# Patient Record
Sex: Male | Born: 1945 | Race: White | Hispanic: No | Marital: Married | Smoking: Former smoker
Health system: Southern US, Community
[De-identification: ages and names within clinical notes are randomized; demographics above are authoritative.]

## PROBLEM LIST (undated history)

## (undated) DIAGNOSIS — E785 Hyperlipidemia, unspecified: Secondary | ICD-10-CM

## (undated) DIAGNOSIS — M1990 Unspecified osteoarthritis, unspecified site: Secondary | ICD-10-CM

## (undated) DIAGNOSIS — N200 Calculus of kidney: Secondary | ICD-10-CM

## (undated) DIAGNOSIS — I499 Cardiac arrhythmia, unspecified: Secondary | ICD-10-CM

## (undated) DIAGNOSIS — Z87442 Personal history of urinary calculi: Secondary | ICD-10-CM

## (undated) DIAGNOSIS — T7840XA Allergy, unspecified, initial encounter: Secondary | ICD-10-CM

## (undated) DIAGNOSIS — K219 Gastro-esophageal reflux disease without esophagitis: Secondary | ICD-10-CM

## (undated) DIAGNOSIS — R0600 Dyspnea, unspecified: Secondary | ICD-10-CM

## (undated) DIAGNOSIS — R350 Frequency of micturition: Secondary | ICD-10-CM

## (undated) DIAGNOSIS — J181 Lobar pneumonia, unspecified organism: Principal | ICD-10-CM

## (undated) DIAGNOSIS — G4730 Sleep apnea, unspecified: Secondary | ICD-10-CM

## (undated) DIAGNOSIS — I10 Essential (primary) hypertension: Secondary | ICD-10-CM

## (undated) HISTORY — DX: Allergy, unspecified, initial encounter: T78.40XA

## (undated) HISTORY — PX: OTHER SURGICAL HISTORY: SHX169

## (undated) HISTORY — DX: Calculus of kidney: N20.0

## (undated) HISTORY — DX: Unspecified osteoarthritis, unspecified site: M19.90

## (undated) HISTORY — PX: CERVICAL FUSION: SHX112

## (undated) HISTORY — PX: CARPAL TUNNEL RELEASE: SHX101

## (undated) HISTORY — DX: Essential (primary) hypertension: I10

## (undated) HISTORY — PX: FRACTURE SURGERY: SHX138

## (undated) HISTORY — PX: SPINE SURGERY: SHX786

## (undated) HISTORY — DX: Hyperlipidemia, unspecified: E78.5

## (undated) HISTORY — DX: Lobar pneumonia, unspecified organism: J18.1

## (undated) HISTORY — PX: EYE SURGERY: SHX253

## (undated) HISTORY — PX: LIPOMA EXCISION: SHX5283

## (undated) HISTORY — PX: UMBILICAL HERNIA REPAIR: SHX196

## (undated) HISTORY — PX: HERNIA REPAIR: SHX51

---

## 2010-04-12 LAB — BASIC METABOLIC PANEL: Glucose: 87 mg/dL

## 2010-04-12 LAB — LIPID PANEL
Cholesterol: 134 mg/dL (ref 0–200)
HDL: 35 mg/dL (ref 35–70)
Triglycerides: 118 mg/dL (ref 40–160)
Triglycerides: 118 mg/dL (ref 40–160)

## 2010-04-12 LAB — HEPATIC FUNCTION PANEL: AST: 20 U/L (ref 14–40)

## 2011-10-12 ENCOUNTER — Encounter: Payer: Self-pay | Admitting: Family Medicine

## 2011-10-12 ENCOUNTER — Ambulatory Visit (INDEPENDENT_AMBULATORY_CARE_PROVIDER_SITE_OTHER): Payer: Medicare Other | Admitting: Family Medicine

## 2011-10-12 DIAGNOSIS — K219 Gastro-esophageal reflux disease without esophagitis: Secondary | ICD-10-CM

## 2011-10-12 DIAGNOSIS — E785 Hyperlipidemia, unspecified: Secondary | ICD-10-CM | POA: Insufficient documentation

## 2011-10-12 DIAGNOSIS — Z125 Encounter for screening for malignant neoplasm of prostate: Secondary | ICD-10-CM

## 2011-10-12 DIAGNOSIS — I10 Essential (primary) hypertension: Secondary | ICD-10-CM | POA: Insufficient documentation

## 2011-10-12 DIAGNOSIS — M25476 Effusion, unspecified foot: Secondary | ICD-10-CM

## 2011-10-12 NOTE — Progress Notes (Signed)
Subjective:    Patient ID: Tracy Bennett, male    DOB: Sep 07, 1946, 65 y.o.   MRN: [id number]  HPI Here to estab care.   Earlier this year broke 5th digit on his right foot after hit toe on a corner in his house.  Took along time but finally got better.  Then stepped into a hole in the yard about about 6 months ago.Top of foot was bruised but no ankle pain at that time.  Now pain is mostly below below the right ankle and started swelling later. Went to [facility] and took Veterinary surgeon.  No fractures.  Then given an ASO and put on NSAIDs. Worse brace for 4 weeks but still was sore. No prior hx of gout.  No other joints are swollen and tender. Occ gets a burning sesnation as well.  No redness.  Walking for long periods aggrevates it.  Ankle never gets red or ecessively tender.   Lipids- Tolerating statin well. Due for labs. No malgias or SE  HTN- Takes med regularly. No CP, SOB or dizziness.   Review of Systems  Constitutional: Negative for fever, diaphoresis and unexpected weight change.  HENT: Negative for hearing loss, rhinorrhea, sneezing and tinnitus.   Eyes: Negative for visual disturbance.  Respiratory: Negative for cough and wheezing.   Cardiovascular: Negative for chest pain and palpitations.  Gastrointestinal: Negative for nausea, vomiting, diarrhea and blood in stool.  Genitourinary: Negative for dysuria and discharge.  Musculoskeletal: Positive for joint swelling. Negative for myalgias and arthralgias.  Skin: Negative for rash.  Neurological: Negative for headaches.  Hematological: Negative for adenopathy.  Psychiatric/Behavioral: Negative for sleep disturbance and dysphoric mood. The patient is not nervous/anxious.        BP 145/84  Pulse 74  Ht 5\' 10"  (1.778 m)  Wt 292 lb (132.45 kg)  BMI 41.90 kg/m2    No Known Allergies  Past Medical History  Diagnosis Date  . Hypertension   . Hyperlipidemia   . Kidney stones     x 4     Past Surgical History  Procedure Date    . Left knee surgery     ACL repair   . Umbilical hernia repair   . Carpal tunnel release     Right   . Cervical fusion     C5-6    History   Social History  . Marital Status: Married    Spouse Name: Okey Dupre     Number of Children: 1  . Years of Education: N/A   Occupational History  . Retired      [employer]    Social History Main Topics  . Smoking status: Former Smoker    Quit date: 02/09/1980  . Smokeless tobacco: Not on file  . Alcohol Use: Yes  . Drug Use: No  . Sexually Active: Yes   Other Topics Concern  . Not on file   Social History Narrative   Golf about 2 times per week.      Family History  Problem Relation Age of Onset  . COPD Mother     Current outpatient prescriptions:esomeprazole (NEXIUM) 40 MG capsule, Take 40 mg by mouth daily before breakfast.  , Disp: , Rfl: ;  simvastatin (ZOCOR) 40 MG tablet, Take 40 mg by mouth at bedtime.  , Disp: , Rfl: ;  valsartan-hydrochlorothiazide (DIOVAN-HCT) 80-12.5 MG per tablet, Take 1 tablet by mouth daily.  , Disp: , Rfl:    Objective:   Physical Exam  Constitutional: He is  oriented to person, place, and time. He appears well-developed and well-nourished.  HENT:  Head: Normocephalic and atraumatic.  Mouth/Throat: Oropharynx is clear and moist.  Cardiovascular: Normal rate, regular rhythm, normal heart sounds and intact distal pulses.        Normal carotid dopplers.   Pulmonary/Chest: Effort normal and breath sounds normal.  Musculoskeletal:       Right foot with edema below the right lateral malleolus. NROM. Strength 5/5. DP pulse 2+. No redness.   Neurological: He is alert and oriented to person, place, and time.  Skin: Skin is warm and dry.  Psychiatric: He has a normal mood and affect. His behavior is normal.          Assessment & Plan:  Right lateral foot pain for one year- Unclear etiology. Unlikely to be gout based on history but will check Uric acid. Has had normal xrays. Consider occult stress  fracture and tendon damage. Would like to get further imaging with MRI of the foot. If normal then consider podiatry referral.   Hyperlipidemia- Due to check lipids and CMP. Slip given  Hypertension- Not at goal today. Recheck in one month. Make sure eating healthy and getting more regular exercise. Will adjust med if not at goal at f/u appt.

## 2011-10-12 NOTE — Patient Instructions (Signed)
We will call you with your lab results. If you don't here from us in about a week then please give us a call at [phone].  

## 2011-10-13 LAB — COMPLETE METABOLIC PANEL WITH GFR
ALT: 30 U/L (ref 0–53)
Albumin: 4.3 g/dL (ref 3.5–5.2)
Alkaline Phosphatase: 52 U/L (ref 39–117)
GFR, Est Non African American: 61 mL/min
Glucose, Bld: 103 mg/dL — ABNORMAL HIGH (ref 70–99)
Potassium: 4.3 mEq/L (ref 3.5–5.3)
Sodium: 142 mEq/L (ref 135–145)
Total Bilirubin: 0.8 mg/dL (ref 0.3–1.2)
Total Protein: 6.3 g/dL (ref 6.0–8.3)

## 2011-10-13 LAB — LIPID PANEL
HDL: 39 mg/dL — ABNORMAL LOW (ref >39)
LDL Cholesterol: 91 mg/dL (ref 0–99)
Total CHOL/HDL Ratio: 3.8 Ratio
VLDL: 20 mg/dL (ref 0–40)

## 2011-10-13 LAB — PSA: PSA: 2.75 ng/mL (ref <=4.00)

## 2011-10-13 LAB — URIC ACID: Uric Acid, Serum: 7 mg/dL (ref 4.0–7.8)

## 2011-10-14 ENCOUNTER — Emergency Department (INDEPENDENT_AMBULATORY_CARE_PROVIDER_SITE_OTHER)
Admission: EM | Admit: 2011-10-14 | Discharge: 2011-10-14 | Disposition: A | Discharge status: Home / Self Care | Payer: Medicare Other | Source: Home / Self Care | Attending: Emergency Medicine | Admitting: Emergency Medicine

## 2011-10-14 DIAGNOSIS — J209 Acute bronchitis, unspecified: Secondary | ICD-10-CM

## 2011-10-14 DIAGNOSIS — R05 Cough: Secondary | ICD-10-CM

## 2011-10-14 DIAGNOSIS — R059 Cough, unspecified: Secondary | ICD-10-CM

## 2011-10-14 HISTORY — DX: Gastro-esophageal reflux disease without esophagitis: K21.9

## 2011-10-14 MED ORDER — HYDROCODONE-HOMATROPINE 5-1.5 MG/5ML PO SYRP: 5.0000 mL | ORAL_SOLUTION | Freq: Four times a day (QID) | ORAL | Status: AC | PRN

## 2011-10-14 MED ORDER — AZITHROMYCIN 250 MG PO TABS: ORAL_TABLET | ORAL | Status: AC

## 2011-10-14 NOTE — ED Provider Notes (Signed)
History     CSN: [id number] Arrival date & time: 10/14/2011 12:12 PM   First MD Initiated Contact with Patient 10/14/11 1246      Chief Complaint  Patient presents with  . Sinusitis    (Consider location/radiation/quality/duration/timing/severity/associated sxs/prior treatment) HPI Tracy Bennett is a 66 y.o. male who complains of onset of cold symptoms for 5 days.  he is using netipot which helps a little bit.  He saw Dr. Linford Arnold last week for same thing but feels he is getting worse. + sore throat + cough No pleuritic pain No wheezing + nasal congestion + post-nasal drainage + sinus pain/pressure + chest congestion No itchy/red eyes No earache No hemoptysis No SOB No chills/sweats No fever No nausea No vomiting No abdominal pain No diarrhea No skin rashes No fatigue No myalgias No headache     Past Medical History  Diagnosis Date  . Hypertension   . Hyperlipidemia   . Kidney stones     x 4   . GERD (gastroesophageal reflux disease)     Past Surgical History  Procedure Date  . Left knee surgery     ACL repair   . Umbilical hernia repair   . Carpal tunnel release     Right   . Cervical fusion     C5-6    Family History  Problem Relation Age of Onset  . COPD Mother     History  Substance Use Topics  . Smoking status: Former Smoker    Quit date: 02/09/1980  . Smokeless tobacco: Not on file  . Alcohol Use: Yes      Review of Systems  Allergies  Review of patient's allergies indicates no known allergies.  Home Medications   Current Outpatient Rx  Name Route Sig Dispense Refill  . ESOMEPRAZOLE MAGNESIUM 40 MG PO CPDR Oral Take 40 mg by mouth daily before breakfast.      . SIMVASTATIN 40 MG PO TABS Oral Take 40 mg by mouth at bedtime.      Marland Kitchen VALSARTAN-HYDROCHLOROTHIAZIDE 80-12.5 MG PO TABS Oral Take 1 tablet by mouth daily.        BP 128/83  Pulse 74  Temp(Src) 97.7 F (36.5 C) (Oral)  Resp 20  Ht 5\' 10"  (1.778 m)  Wt 273 lb (123.832  kg)  BMI 39.17 kg/m2  SpO2 97%  Physical Exam  Nursing note and vitals reviewed. Constitutional: He is oriented to person, place, and time. He appears well-developed and well-nourished.  HENT:  Head: Normocephalic and atraumatic.  Right Ear: Tympanic membrane, external ear and ear canal normal.  Left Ear: Tympanic membrane, external ear and ear canal normal.  Nose: Mucosal edema and rhinorrhea present.  Mouth/Throat: Posterior oropharyngeal erythema present. No oropharyngeal exudate or posterior oropharyngeal edema.  Neck: Neck supple.  Cardiovascular: Regular rhythm and normal heart sounds.   Pulmonary/Chest: Effort normal and breath sounds normal. No respiratory distress.  Neurological: He is alert and oriented to person, place, and time.  Skin: Skin is warm and dry.  Psychiatric: He has a normal mood and affect. His speech is normal.    ED Course  Procedures (including critical care time)  Labs Reviewed - No data to display No results found.   No diagnosis found.    MDM   1)  Take the prescribed antibiotic as instructed. 2)  Use nasal saline solution (over the counter) at least 3 times a day. 3)  Use over the counter decongestants like Zyrtec-D every 12 hours as needed to  help with congestion.  If you have hypertension, do not take medicines with sudafed.  4)  Can take tylenol every 6 hours or motrin every 8 hours for pain or fever. 5)  Follow up with your primary doctor if no improvement in 5-7 days, sooner if increasing pain, fever, or new symptoms.        Lily Kocher, MD 10/14/11 [phone]

## 2011-10-16 ENCOUNTER — Telehealth: Payer: Self-pay | Admitting: Family Medicine

## 2011-10-16 ENCOUNTER — Encounter: Payer: Self-pay | Admitting: Family Medicine

## 2011-10-16 DIAGNOSIS — R9389 Abnormal findings on diagnostic imaging of other specified body structures: Secondary | ICD-10-CM

## 2011-10-16 NOTE — Telephone Encounter (Signed)
I reviewd the [facility] Screening he had done.  Since had did have a small abnormality on test for carotid artery disease then we need to schedule for full carotid dopplers.

## 2011-10-24 ENCOUNTER — Other Ambulatory Visit: Payer: Self-pay | Admitting: Family Medicine

## 2011-10-24 ENCOUNTER — Ambulatory Visit (INDEPENDENT_AMBULATORY_CARE_PROVIDER_SITE_OTHER)
Admission: RE | Admit: 2011-10-24 | Discharge: 2011-10-24 | Disposition: A | Discharge status: Home / Self Care | Payer: Medicare Other | Source: Ambulatory Visit | Attending: Family Medicine | Admitting: Family Medicine

## 2011-10-24 ENCOUNTER — Ambulatory Visit (HOSPITAL_BASED_OUTPATIENT_CLINIC_OR_DEPARTMENT_OTHER)
Admission: RE | Admit: 2011-10-24 | Discharge: 2011-10-24 | Disposition: A | Discharge status: Home / Self Care | Payer: Medicare Other | Source: Ambulatory Visit | Attending: Family Medicine | Admitting: Family Medicine

## 2011-10-24 DIAGNOSIS — I6529 Occlusion and stenosis of unspecified carotid artery: Secondary | ICD-10-CM | POA: Insufficient documentation

## 2011-10-24 DIAGNOSIS — M659 Synovitis and tenosynovitis, unspecified: Secondary | ICD-10-CM | POA: Insufficient documentation

## 2011-10-24 DIAGNOSIS — M25579 Pain in unspecified ankle and joints of unspecified foot: Secondary | ICD-10-CM

## 2011-10-24 DIAGNOSIS — R9389 Abnormal findings on diagnostic imaging of other specified body structures: Secondary | ICD-10-CM

## 2011-10-24 DIAGNOSIS — R609 Edema, unspecified: Secondary | ICD-10-CM

## 2011-10-24 DIAGNOSIS — M25476 Effusion, unspecified foot: Secondary | ICD-10-CM

## 2011-10-24 DIAGNOSIS — M65979 Unspecified synovitis and tenosynovitis, unspecified ankle and foot: Secondary | ICD-10-CM | POA: Insufficient documentation

## 2011-10-27 ENCOUNTER — Other Ambulatory Visit: Payer: Self-pay | Admitting: Family Medicine

## 2011-10-27 DIAGNOSIS — S86309A Unspecified injury of muscle(s) and tendon(s) of peroneal muscle group at lower leg level, unspecified leg, initial encounter: Secondary | ICD-10-CM

## 2011-10-30 ENCOUNTER — Telehealth: Payer: Self-pay | Admitting: *Deleted

## 2011-10-30 NOTE — Telephone Encounter (Signed)
Pt.notified

## 2011-10-30 NOTE — Telephone Encounter (Signed)
We can fit him with a cam walker if he can come by. Just put in as nurse visit.

## 2011-10-30 NOTE — Telephone Encounter (Signed)
Pt calls and states foot is really killing him and has even took an Oxycodone to help the pain that he had left over from kidney stones a while ago. When up on it it swells arounf the ankle and painful. Hasnt got appt yet with ortho. Didn't know if a boot or wrap would help. Does not want to see Dr. Ihor Gully- seen before and did not like him at all.

## 2011-10-31 ENCOUNTER — Ambulatory Visit: Payer: Medicare Other | Admitting: Family Medicine

## 2011-10-31 ENCOUNTER — Ambulatory Visit (INDEPENDENT_AMBULATORY_CARE_PROVIDER_SITE_OTHER): Payer: Medicare Other | Admitting: Family Medicine

## 2011-10-31 DIAGNOSIS — M25579 Pain in unspecified ankle and joints of unspecified foot: Secondary | ICD-10-CM

## 2011-10-31 DIAGNOSIS — S86309A Unspecified injury of muscle(s) and tendon(s) of peroneal muscle group at lower leg level, unspecified leg, initial encounter: Secondary | ICD-10-CM

## 2011-10-31 DIAGNOSIS — S8990XA Unspecified injury of unspecified lower leg, initial encounter: Secondary | ICD-10-CM

## 2011-10-31 NOTE — Progress Notes (Signed)
  Subjective:    Patient ID: Tracy Bennett, male    DOB: 09/20/1946, 65 y.o.   MRN: [id number] Pt here to be fitted for cam walker for right ankle tendon tear as seen on MRI HPI    Review of Systems     Objective:   Physical Exam        Assessment & Plan:

## 2011-11-02 ENCOUNTER — Encounter: Payer: Self-pay | Admitting: Family Medicine

## 2011-11-14 HISTORY — PX: FOOT TENDON SURGERY: SHX958

## 2011-12-21 ENCOUNTER — Other Ambulatory Visit: Payer: Self-pay | Admitting: *Deleted

## 2011-12-21 MED ORDER — VALSARTAN-HYDROCHLOROTHIAZIDE 80-12.5 MG PO TABS: 1.0000 | ORAL_TABLET | Freq: Every day | ORAL | Status: DC

## 2011-12-21 MED ORDER — SIMVASTATIN 40 MG PO TABS: 40.0000 mg | ORAL_TABLET | Freq: Every day | ORAL | Status: DC

## 2011-12-26 ENCOUNTER — Telehealth: Payer: Self-pay | Admitting: *Deleted

## 2011-12-26 MED ORDER — SIMVASTATIN 40 MG PO TABS: 40.0000 mg | ORAL_TABLET | Freq: Every day | ORAL | Status: DC

## 2011-12-26 MED ORDER — VALSARTAN-HYDROCHLOROTHIAZIDE 80-12.5 MG PO TABS: 1.0000 | ORAL_TABLET | Freq: Every day | ORAL | Status: DC

## 2011-12-26 MED ORDER — ESOMEPRAZOLE MAGNESIUM 40 MG PO CPDR: 40.0000 mg | DELAYED_RELEASE_CAPSULE | Freq: Every day | ORAL | Status: DC

## 2011-12-26 NOTE — Telephone Encounter (Signed)
Pt states he would like to have his medications sent to the pharmacy by the name brand. He doesn't want the generic and he also wants it written for 90 days at a time.

## 2011-12-26 NOTE — Telephone Encounter (Signed)
Sent rx for brand only.

## 2012-02-09 ENCOUNTER — Ambulatory Visit (INDEPENDENT_AMBULATORY_CARE_PROVIDER_SITE_OTHER): Payer: Medicare Other | Admitting: Family Medicine

## 2012-02-09 ENCOUNTER — Encounter: Payer: Self-pay | Admitting: Family Medicine

## 2012-02-09 VITALS — BP 116/76 | HR 74 | Temp 97.4°F | Ht 70.0 in | Wt 297.0 lb

## 2012-02-09 DIAGNOSIS — E669 Obesity, unspecified: Secondary | ICD-10-CM | POA: Insufficient documentation

## 2012-02-09 DIAGNOSIS — J069 Acute upper respiratory infection, unspecified: Secondary | ICD-10-CM | POA: Diagnosis not present

## 2012-02-09 DIAGNOSIS — Z23 Encounter for immunization: Secondary | ICD-10-CM

## 2012-02-09 DIAGNOSIS — Z9181 History of falling: Secondary | ICD-10-CM

## 2012-02-09 DIAGNOSIS — Z1331 Encounter for screening for depression: Secondary | ICD-10-CM

## 2012-02-09 MED ORDER — HYDROCOD POLST-CHLORPHEN POLST 10-8 MG/5ML PO LQCR: 5.0000 mL | Freq: Every evening | ORAL | Status: DC | PRN

## 2012-02-09 MED ORDER — DIPHENHYD-HYDROCORT-NYSTATIN MT SUSP: 5.0000 mL | Freq: Three times a day (TID) | OROMUCOSAL | Status: DC

## 2012-02-09 NOTE — Patient Instructions (Signed)

## 2012-02-09 NOTE — Progress Notes (Signed)
  Subjective:    Patient ID: Tracy Bennett, male    DOB: 03-13-1946, 66 y.o.   MRN: [id number]  HPI Sinus congestion and pain for about 3 days. Started with ST initially but that is better.  Little better during the day and worse at night.  Using Aleve.  No fever.  No cough durng day but coughing a lot at night. Uses a netti pot and runs a humifiers at night. No GI symptoms.  Went to his urologist for kidney stones. PSA was up and started ABX.  Has 14 more days of abx.  Getting up more at night. Given samples of rapaflow. It is edematous tongue is now very sore and has an ulcer. He says there was an antibiotic that he took years ago that caused similar symptoms. He does the name of it but wonders if it could be the same drug. He also feels he is developing a whitish coating on his tongue. It feels very tender. Review of Systems     Objective:   Physical Exam  Constitutional: He is oriented to person, place, and time. He appears well-developed and well-nourished.  HENT:  Head: Normocephalic and atraumatic.  Right Ear: External ear normal.  Left Ear: External ear normal.  Nose: Nose normal.  Mouth/Throat: Oropharynx is clear and moist.       TMs and canals are clear. There is a white ulcer at the tip of his tongue.  Eyes: Conjunctivae and EOM are normal. Pupils are equal, round, and reactive to light.  Neck: Neck supple. No thyromegaly present.  Cardiovascular: Normal rate and normal heart sounds.   Pulmonary/Chest: Effort normal and breath sounds normal.  Lymphadenopathy:    He has no cervical adenopathy.  Neurological: He is alert and oriented to person, place, and time.  Skin: Skin is warm and dry.  Psychiatric: He has a normal mood and affect.          Assessment & Plan:  URI - Symptomatic care. Call if not better in one week or if suddenly getting wrose.  Says usually a zpack only works for him to clear this up.  I asked him to give it a few more days.  Given pneumonia  vaccine today.   Fall assessment- PHQ-9 score of 1, which is normal.   Depression Screen - Socre of 2, which is negative for depression.   Tongue Ulcer - Call urology and have them change the bactrim and will cadl in magic mouth wash for acute relief that has nystatin in it.

## 2012-03-04 ENCOUNTER — Other Ambulatory Visit: Payer: Self-pay | Admitting: Family Medicine

## 2012-08-27 ENCOUNTER — Other Ambulatory Visit: Payer: Self-pay | Admitting: Family Medicine

## 2012-10-02 ENCOUNTER — Other Ambulatory Visit: Payer: Self-pay | Admitting: Family Medicine

## 2012-10-02 NOTE — Telephone Encounter (Signed)
Needs office visit before future refills 

## 2012-10-25 DIAGNOSIS — H17819 Minor opacity of cornea, unspecified eye: Secondary | ICD-10-CM | POA: Diagnosis not present

## 2012-10-25 DIAGNOSIS — H2589 Other age-related cataract: Secondary | ICD-10-CM | POA: Diagnosis not present

## 2012-10-31 DIAGNOSIS — Z23 Encounter for immunization: Secondary | ICD-10-CM | POA: Diagnosis not present

## 2012-11-06 DIAGNOSIS — H663X9 Other chronic suppurative otitis media, unspecified ear: Secondary | ICD-10-CM | POA: Diagnosis not present

## 2012-11-06 DIAGNOSIS — H6120 Impacted cerumen, unspecified ear: Secondary | ICD-10-CM | POA: Diagnosis not present

## 2012-12-26 ENCOUNTER — Telehealth: Payer: Self-pay | Admitting: Family Medicine

## 2012-12-26 ENCOUNTER — Other Ambulatory Visit: Payer: Self-pay | Admitting: *Deleted

## 2012-12-26 MED ORDER — VALSARTAN-HYDROCHLOROTHIAZIDE 80-12.5 MG PO TABS: 1.0000 | ORAL_TABLET | Freq: Every day | ORAL | Status: DC

## 2012-12-26 NOTE — Telephone Encounter (Signed)
Call pt: received refill request from pharmacy.He needs appt. Hasn't been seen in 10 months for his BP.

## 2012-12-27 NOTE — Telephone Encounter (Signed)
Left message for patient to call back and schedule office visit or physical.

## 2013-01-15 ENCOUNTER — Other Ambulatory Visit: Payer: Self-pay

## 2013-01-15 DIAGNOSIS — N200 Calculus of kidney: Secondary | ICD-10-CM | POA: Diagnosis not present

## 2013-01-15 DIAGNOSIS — N401 Enlarged prostate with lower urinary tract symptoms: Secondary | ICD-10-CM | POA: Diagnosis not present

## 2013-01-15 MED ORDER — VALSARTAN-HYDROCHLOROTHIAZIDE 80-12.5 MG PO TABS: 1.0000 | ORAL_TABLET | Freq: Every day | ORAL | Status: DC

## 2013-01-28 ENCOUNTER — Ambulatory Visit (INDEPENDENT_AMBULATORY_CARE_PROVIDER_SITE_OTHER): Payer: Medicare Other | Admitting: Family Medicine

## 2013-01-28 ENCOUNTER — Encounter: Payer: Self-pay | Admitting: Family Medicine

## 2013-01-28 VITALS — BP 126/67 | HR 80 | Ht 69.0 in | Wt 298.0 lb

## 2013-01-28 DIAGNOSIS — I10 Essential (primary) hypertension: Secondary | ICD-10-CM | POA: Diagnosis not present

## 2013-01-28 DIAGNOSIS — M545 Low back pain: Secondary | ICD-10-CM | POA: Diagnosis not present

## 2013-01-28 DIAGNOSIS — Z131 Encounter for screening for diabetes mellitus: Secondary | ICD-10-CM

## 2013-01-28 DIAGNOSIS — E65 Localized adiposity: Secondary | ICD-10-CM

## 2013-01-28 DIAGNOSIS — Z6841 Body Mass Index (BMI) 40.0 and over, adult: Secondary | ICD-10-CM

## 2013-01-28 DIAGNOSIS — N400 Enlarged prostate without lower urinary tract symptoms: Secondary | ICD-10-CM | POA: Insufficient documentation

## 2013-01-28 DIAGNOSIS — Z23 Encounter for immunization: Secondary | ICD-10-CM

## 2013-01-28 DIAGNOSIS — Z0000 Encounter for general adult medical examination without abnormal findings: Secondary | ICD-10-CM

## 2013-01-28 DIAGNOSIS — Z79899 Other long term (current) drug therapy: Secondary | ICD-10-CM | POA: Diagnosis not present

## 2013-01-28 MED ORDER — SIMVASTATIN 40 MG PO TABS: 40.0000 mg | ORAL_TABLET | Freq: Every day | ORAL | Status: DC

## 2013-01-28 MED ORDER — ESOMEPRAZOLE MAGNESIUM 40 MG PO CPDR: 40.0000 mg | DELAYED_RELEASE_CAPSULE | Freq: Every day | ORAL | Status: DC

## 2013-01-28 MED ORDER — VALSARTAN-HYDROCHLOROTHIAZIDE 80-12.5 MG PO TABS: 1.0000 | ORAL_TABLET | Freq: Every day | ORAL | Status: DC

## 2013-01-28 MED ORDER — TIZANIDINE HCL 4 MG PO TABS: 4.0000 mg | ORAL_TABLET | Freq: Three times a day (TID) | ORAL | Status: DC | PRN

## 2013-01-28 NOTE — Patient Instructions (Signed)
Keep up a regular exercise program and make sure you are eating a healthy diet. Try to eat 4 servings of dairy a day, or if you are lactose intolerant take a calcium with vitamin D daily.  Your vaccines are up to date.  Please call the office back and make a followup appointment if you are not least 50% better with your back in 2-3 weeks. Strongly encouraged regular exercise to work on weight loss. I think this will make a big difference.

## 2013-01-28 NOTE — Progress Notes (Signed)
Subjective:    Patient ID: Tracy Bennett, male    DOB: 1946-03-21, 67 y.o.   MRN: [id number]  HPI Here for CPE today.    Low back pain for aobut 1.5 weeks after lifting something. Says had had similar sxs before, on and off throught the years. Usually resolves after a cuople of weeks.  Says radiating into right upper thigh.  Has been using Aleve and icey hot topically.  Helping some but feels like overall not getting better. Worse when leans back.  Reports a lot of stiffness first thing in the morning. He says it's actually really hard to get out of bed. Gets a little bit better as he gets up and gets moving.  Has seen urology for prosate exam already this year. Repeat PSA this year was actually down a little bit which is fantastic. They started finasteride about a year ago so we can adjust his medication list. He is still taking the Flomax.  Had surgery on his right foot earlier this year. Still healing. Still feels fairly numb.  HTN- Pt denies chest pain, SOB, dizziness, or heart palpitations.  Taking meds as directed w/o problems.  Denies medication side effects.    Review of Systems     BP 126/67  Pulse 80  Ht 5\' 9"  (1.753 m)  Wt 298 lb (135.172 kg)  BMI 43.99 kg/m2    Allergies  Allergen Reactions  . Lisinopril Cough    Past Medical History  Diagnosis Date  . Hypertension   . Hyperlipidemia   . Kidney stones     x 4   . GERD (gastroesophageal reflux disease)     Past Surgical History  Procedure Laterality Date  . Left knee surgery      ACL repair   . Umbilical hernia repair    . Carpal tunnel release      Right   . Cervical fusion      C5-6  . Foot tendon surgery  11/14/2011    Dr. Lajoyce Corners    History   Social History  . Marital Status: Married    Spouse Name: Okey Dupre     Number of Children: 1  . Years of Education: N/A   Occupational History  . Retired      [employer]    Social History Main Topics  . Smoking status: Former Smoker    Quit date:  02/09/1980  . Smokeless tobacco: Not on file  . Alcohol Use: Yes  . Drug Use: No  . Sexually Active: Yes   Other Topics Concern  . Not on file   Social History Narrative   Golf about 2 times per week.      Family History  Problem Relation Age of Onset  . COPD Mother     Outpatient Encounter Prescriptions as of 01/28/2013  Medication Sig Dispense Refill  . esomeprazole (NEXIUM) 40 MG capsule Take 1 capsule (40 mg total) by mouth daily before breakfast. Brand only  90 capsule  1  . finasteride (PROSCAR) 5 MG tablet Take 5 mg by mouth daily.      . simvastatin (ZOCOR) 40 MG tablet TAKE 1 TABLET (40 MG TOTAL) BY MOUTH AT BEDTIME. BRAND ONLY  90 tablet  0  . Tamsulosin HCl (FLOMAX) 0.4 MG CAPS Take by mouth.      . valsartan-hydrochlorothiazide (DIOVAN HCT) 80-12.5 MG per tablet Take 1 tablet by mouth daily.  30 tablet  0  . [DISCONTINUED] chlorpheniramine-HYDROcodone (TUSSIONEX) 10-8 MG/5ML LQCR Take 5  mLs by mouth at bedtime as needed.  150 mL  0  . [DISCONTINUED] Diphenhyd-Hydrocort-Nystatin SUSP Use as directed 5 mLs in the mouth or throat 3 (three) times daily. Swish and spit.  200 mL  0   No facility-administered encounter medications on file as of 01/28/2013.    Preventive Screening-Counseling & Management  Tobacco History  Smoking status  . Former Smoker  . Quit date: 02/09/1980  Smokeless tobacco  . Not on file    Problems Prior to Visit 1.   Current Problems (verified) Patient Active Problem List  Diagnosis  . Hypertension  . GERD (gastroesophageal reflux disease)  . Hyperlipidemia  . Obesity    Medications Prior to Visit Current Outpatient Prescriptions on File Prior to Visit  Medication Sig Dispense Refill  . Tamsulosin HCl (FLOMAX) 0.4 MG CAPS Take by mouth.       No current facility-administered medications on file prior to visit.    Current Medications (verified) Current Outpatient Prescriptions  Medication Sig Dispense Refill  . esomeprazole  (NEXIUM) 40 MG capsule Take 1 capsule (40 mg total) by mouth daily before breakfast. Brand only  90 capsule  1  . finasteride (PROSCAR) 5 MG tablet Take 5 mg by mouth daily.      . simvastatin (ZOCOR) 40 MG tablet Take 1 tablet (40 mg total) by mouth at bedtime.  90 tablet  1  . Tamsulosin HCl (FLOMAX) 0.4 MG CAPS Take by mouth.      Marland Kitchen tiZANidine (ZANAFLEX) 4 MG tablet Take 1 tablet (4 mg total) by mouth every 8 (eight) hours as needed.  30 tablet  0  . valsartan-hydrochlorothiazide (DIOVAN-HCT) 80-12.5 MG per tablet Take 1 tablet by mouth daily. Generic please  90 tablet  1   No current facility-administered medications for this visit.     Allergies (verified) Lisinopril   Are there smokers in your home (other than you)?  No  Risk Factors Current exercise habits: none  Dietary issues discussed: none   Cardiac risk factors: advanced age (older than 11 for men, 26 for women), hypertension, male gender, obesity (BMI >= 30 kg/m2) and sedentary lifestyle.  Depression Screen (Note: if answer to either of the following is "Yes", a more complete depression screening is indicated)   Q1: Over the past two weeks, have you felt down, depressed or hopeless? No  Q2: Over the past two weeks, have you felt little interest or pleasure in doing things? No  Have you lost interest or pleasure in daily life? No  Do you often feel hopeless? No  Do you cry easily over simple problems? No  Activities of Daily Living In your present state of health, do you have any difficulty performing the following activities?:  Driving? No Managing money?  No Feeding yourself? No Getting from bed to chair? No Climbing a flight of stairs? No Preparing food and eating?: No Bathing or showering? No Getting dressed: No Getting to the toilet? No Using the toilet:No Moving around from place to place: No In the past year have you fallen or had a near fall?:No   Are you sexually active?  Yes  Do you have more than  one partner?  No  Hearing Difficulties: No Do you often ask people to speak up or repeat themselves? No Do you experience ringing or noises in your ears? No Do you have difficulty understanding soft or whispered voices? No   Do you feel that you have a problem with memory? No  Do  you often misplace items? No  Do you feel safe at home?  No  Cognitive Testing  Alert? Yes  Normal Appearance?Yes  Oriented to person? Yes  Place? Yes   Time? Yes  Recall of three objects?  Yes  Can perform simple calculations? Yes  Displays appropriate judgment?Yes  Can read the correct time from a watch face?Yes   Advanced Directives have been discussed with the patient? Not sure.    List the Names of Other Physician/Practitioners you currently use: 1.  [facility]  Indicate any recent Medical Services you may have received from other than [facility] providers in the past year (date may be approximate).  Immunization History  Administered Date(s) Administered  . Influenza Split 11/27/2012, 12/16/2012  . Pneumococcal Polysaccharide 02/09/2012  . Tdap 01/28/2013    Screening Tests Health Maintenance  Topic Date Due  . Influenza Vaccine  07/28/2012  . Tetanus/tdap  01/29/2013  . Colonoscopy  11/28/2014  . Pneumococcal Polysaccharide Vaccine Age 22 And Over  Completed  . Zostavax  Addressed    All answers were reviewed with the patient and necessary referrals were made:    Objective:   Physical Exam  Constitutional: He is oriented to person, place, and time. He appears well-developed and well-nourished.  HENT:  Head: Normocephalic and atraumatic.  Right Ear: External ear normal.  Left Ear: External ear normal.  Nose: Nose normal.  Mouth/Throat: Oropharynx is clear and moist.  Eyes: Conjunctivae and EOM are normal. Pupils are equal, round, and reactive to light.  Neck: Normal range of motion. Neck supple. No thyromegaly present.  Cardiovascular: Normal rate, regular rhythm, normal  heart sounds and intact distal pulses.   No carotid bruits.   Pulmonary/Chest: Effort normal and breath sounds normal.  Abdominal: Soft. Bowel sounds are normal. He exhibits no distension and no mass. There is no tenderness. There is no rebound and no guarding.  Musculoskeletal: Normal range of motion. He exhibits no edema.  Normal flexion. Decreased extension. Normal rotation right and left, normal side bending. Negative straight leg raise bilaterally. Nontender over the lumbar spine. Nontender over the right SI joint. Hip, knee, ankle strength is 5 out of 5 bilaterally. Patellar reflexes are 2+ bilaterally. He does have appears to buffalo hump on his upper back. It is nontender. No bruising.  Lymphadenopathy:    He has no cervical adenopathy.  Neurological: He is alert and oriented to person, place, and time. He has normal reflexes.  Skin: Skin is warm and dry.  Psychiatric: He has a normal mood and affect. His behavior is normal. Judgment and thought content normal.          Assessment & Plan:  cpe -  Keep up a regular exercise program and make sure you are eating a healthy diet Try to eat 4 servings of dairy a day, or if you are lactose intolerant take a calcium with vitamin D daily.  Your vaccines are up to date.  Colonoscopy UTD Shingles vaccine  UTD Due for CMP and lipids  Given tdap today  PSA followed by Urology.   HTN - well controlled today. F/U in 6 months.  Low Back Pain - most likely muscular skeletal strain. Exam is fairly normal today. Continue with anti-inflammatory. Given handout on stretches today. Will add muscle relaxer. If not at least 50% better the next 2-3 weeks and please call the office back and we can consider further evaluation with x-ray and possible referral to physical therapy.  BMI 44 - discussed  the importance of regular exercise. Water aerobics may be a great choice for him because of his foot problems. Continue work on [employer].  Buffalo hump-most likely just a fat deposit. But certainly we can take a look at the adrenals. He could have some mild Cushing's disease related to his obesity and blood pressure. I want to order a salivary cortisol level but I was able to find it as a lab choice in our computer system. [facility] screen for glucose inolerance.

## 2013-01-29 ENCOUNTER — Telehealth: Payer: Self-pay | Admitting: Family Medicine

## 2013-01-29 DIAGNOSIS — I10 Essential (primary) hypertension: Secondary | ICD-10-CM | POA: Diagnosis not present

## 2013-01-29 DIAGNOSIS — Z131 Encounter for screening for diabetes mellitus: Secondary | ICD-10-CM | POA: Diagnosis not present

## 2013-01-29 LAB — COMPLETE METABOLIC PANEL WITH GFR
Alkaline Phosphatase: 46 U/L (ref 39–117)
BUN: 15 mg/dL (ref 6–23)
Creat: 1.31 mg/dL (ref 0.50–1.35)
GFR, Est Non African American: 56 mL/min — ABNORMAL LOW
Glucose, Bld: 93 mg/dL (ref 70–99)
Sodium: 139 mEq/L (ref 135–145)
Total Bilirubin: 0.9 mg/dL (ref 0.3–1.2)
Total Protein: 6.2 g/dL (ref 6.0–8.3)

## 2013-01-29 LAB — LIPID PANEL
Cholesterol: 138 mg/dL (ref 0–200)
HDL: 41 mg/dL (ref >39)
Total CHOL/HDL Ratio: 3.4 Ratio
Triglycerides: 128 mg/dL (ref <150)

## 2013-01-29 NOTE — Telephone Encounter (Signed)
LMOM: I call the pharmacy and they  incorrectly filled capsules instead of tabs and that is why his copay was $80 for his zanaflex.  They said if brought bottle back they could correct the mistake.

## 2013-02-03 ENCOUNTER — Other Ambulatory Visit: Payer: Self-pay | Admitting: Family Medicine

## 2013-02-03 DIAGNOSIS — I10 Essential (primary) hypertension: Secondary | ICD-10-CM | POA: Diagnosis not present

## 2013-02-28 LAB — CORTISOL, URINE, 24 HOUR
Cortisol (Ur), Free: 15.4 mcg/24 h (ref 4.0–50.0)
RESULTS RECEIVED: 2.74 g/(24.h) — ABNORMAL HIGH (ref 0.63–2.50)

## 2013-03-11 ENCOUNTER — Other Ambulatory Visit: Payer: Self-pay | Admitting: *Deleted

## 2013-03-11 ENCOUNTER — Telehealth: Payer: Self-pay | Admitting: *Deleted

## 2013-03-11 ENCOUNTER — Other Ambulatory Visit: Payer: Self-pay | Admitting: Family Medicine

## 2013-03-11 DIAGNOSIS — E65 Localized adiposity: Secondary | ICD-10-CM

## 2013-03-11 MED ORDER — VALSARTAN-HYDROCHLOROTHIAZIDE 80-12.5 MG PO TABS: 1.0000 | ORAL_TABLET | Freq: Every day | ORAL | Status: DC

## 2013-03-11 NOTE — Telephone Encounter (Signed)
Pt calling b/c he would like to have the additional testing done that Dr. Linford Arnold informed him of.  Order for cortisol salivary testing placed and pt told that he CANNOT EAT OR DRINK 30 MINS PRIOR TO HAVING THIS LAB DONE! Pt voiced understanding and agreed.   Called [facility] to confirm how the test is to be ordered and spoke with Jamesetta So and she told me to use [facility] test. I also called and spoke with Renae Fickle in the lab to let him know that pt would be coming in to have testing done so that kit would be available and he confirmed that he has 1 kit left. I told him that pt would either be there tomorrow or early next week.Laureen Ochs, Viann Shove

## 2013-03-17 DIAGNOSIS — E65 Localized adiposity: Secondary | ICD-10-CM | POA: Diagnosis not present

## 2013-03-22 LAB — CORTISOL, SALIVARY: Cortisol, Saliva: 0.05 ug/dL

## 2013-04-01 NOTE — Progress Notes (Signed)
Pt notified of MD instructions and he states he will just hold off on it right now but if decides to see surgeon he will call us back. Barry Dienes, LPN

## 2013-08-01 DIAGNOSIS — H60399 Other infective otitis externa, unspecified ear: Secondary | ICD-10-CM | POA: Diagnosis not present

## 2013-08-08 DIAGNOSIS — H60399 Other infective otitis externa, unspecified ear: Secondary | ICD-10-CM | POA: Diagnosis not present

## 2013-08-14 DIAGNOSIS — H60399 Other infective otitis externa, unspecified ear: Secondary | ICD-10-CM | POA: Diagnosis not present

## 2013-09-30 DIAGNOSIS — D126 Benign neoplasm of colon, unspecified: Secondary | ICD-10-CM | POA: Diagnosis not present

## 2013-09-30 DIAGNOSIS — K219 Gastro-esophageal reflux disease without esophagitis: Secondary | ICD-10-CM | POA: Diagnosis not present

## 2013-09-30 DIAGNOSIS — D131 Benign neoplasm of stomach: Secondary | ICD-10-CM | POA: Diagnosis not present

## 2013-09-30 DIAGNOSIS — K5730 Diverticulosis of large intestine without perforation or abscess without bleeding: Secondary | ICD-10-CM | POA: Diagnosis not present

## 2013-09-30 DIAGNOSIS — Z1211 Encounter for screening for malignant neoplasm of colon: Secondary | ICD-10-CM | POA: Diagnosis not present

## 2013-10-02 ENCOUNTER — Other Ambulatory Visit: Payer: Self-pay

## 2013-10-08 DIAGNOSIS — Z23 Encounter for immunization: Secondary | ICD-10-CM | POA: Diagnosis not present

## 2013-10-15 ENCOUNTER — Encounter: Payer: Self-pay | Admitting: Family Medicine

## 2013-11-03 DIAGNOSIS — H2589 Other age-related cataract: Secondary | ICD-10-CM | POA: Diagnosis not present

## 2013-11-03 DIAGNOSIS — Z9889 Other specified postprocedural states: Secondary | ICD-10-CM | POA: Diagnosis not present

## 2013-12-03 DIAGNOSIS — H6120 Impacted cerumen, unspecified ear: Secondary | ICD-10-CM | POA: Diagnosis not present

## 2014-01-03 ENCOUNTER — Other Ambulatory Visit: Payer: Self-pay | Admitting: Family Medicine

## 2014-01-14 ENCOUNTER — Telehealth: Payer: Self-pay | Admitting: *Deleted

## 2014-01-14 ENCOUNTER — Other Ambulatory Visit: Payer: Self-pay | Admitting: Family Medicine

## 2014-01-14 NOTE — Telephone Encounter (Signed)
Called and lvm for pt to return call.Tracy Bennett [place]

## 2014-01-15 MED ORDER — SIMVASTATIN 40 MG PO TABS: ORAL_TABLET | ORAL | Status: DC

## 2014-01-15 NOTE — Telephone Encounter (Signed)
Called pt back and he stated that he was due for he annual exam and was told that he could not make his before 01/28/2014. I told pt that most insurances will not cover annual exams if it has not been a complete calender year since the last exam. He also was upset due to the fact that he could not get the refill for his nexium filled and was told by the pharmacy that he would need to make an appt. He could not understand why this was. I told pt that it all comes down to Medical licensure. I told him that if we were to be audited and they pulled his chart and she had been prescribing meds for him w/o him being seen she could face fines or loose her licence to practice. I told him that certain meds require that blood work is done to ensure that his liver, kidneys and other vital organs are functioning properly to continue on them. Also that if he were having any issues with the meds these could be addressed. He stated that he had told dr.metheney that he has other specialist that he f/u with and they do labs also. I told him that I did notice that he does have other providers that he sees and it is important that when he goes for f/u visits that he makes sure that they send our ofc his notes, labs, ect. This will ensure that he doesn't get unnecessary blood work done if it is within the time frame that is required. Pt voiced understanding. He stated that the problem is the co-pay with his meds. I told him that our policy is to refill 1x for 30 days, and the next refill is for 15 days until he is seen by his provider. I told him that I will send in the 90 day supply of simvastatin however it is imperative for him to keep his f/u appt with dr.metheney. Pt understood and agreed. He thanked me for taking out the time to speak with him regarding this.Audelia Hives [place]

## 2014-01-29 ENCOUNTER — Encounter: Payer: Self-pay | Admitting: Family Medicine

## 2014-01-29 ENCOUNTER — Ambulatory Visit (INDEPENDENT_AMBULATORY_CARE_PROVIDER_SITE_OTHER): Payer: No Typology Code available for payment source | Admitting: Family Medicine

## 2014-01-29 ENCOUNTER — Other Ambulatory Visit: Payer: Self-pay | Admitting: Family Medicine

## 2014-01-29 VITALS — BP 115/73 | HR 81 | Temp 97.9°F | Ht 70.0 in | Wt 312.0 lb

## 2014-01-29 DIAGNOSIS — N400 Enlarged prostate without lower urinary tract symptoms: Secondary | ICD-10-CM | POA: Diagnosis not present

## 2014-01-29 DIAGNOSIS — Z0000 Encounter for general adult medical examination without abnormal findings: Secondary | ICD-10-CM

## 2014-01-29 DIAGNOSIS — I10 Essential (primary) hypertension: Secondary | ICD-10-CM

## 2014-01-29 DIAGNOSIS — E65 Localized adiposity: Secondary | ICD-10-CM | POA: Diagnosis not present

## 2014-01-29 DIAGNOSIS — R7301 Impaired fasting glucose: Secondary | ICD-10-CM | POA: Diagnosis not present

## 2014-01-29 DIAGNOSIS — E785 Hyperlipidemia, unspecified: Secondary | ICD-10-CM

## 2014-01-29 LAB — LIPID PANEL
CHOLESTEROL: 134 mg/dL (ref 0–200)
HDL: 35 mg/dL — ABNORMAL LOW (ref >39)
LDL Cholesterol: 79 mg/dL (ref 0–99)
Total CHOL/HDL Ratio: 3.8 Ratio
Triglycerides: 101 mg/dL (ref <150)
VLDL: 20 mg/dL (ref 0–40)

## 2014-01-29 LAB — COMPLETE METABOLIC PANEL WITH GFR
ALBUMIN: 4.4 g/dL (ref 3.5–5.2)
ALT: 38 U/L (ref 0–53)
AST: 26 U/L (ref 0–37)
Alkaline Phosphatase: 47 U/L (ref 39–117)
BUN: 17 mg/dL (ref 6–23)
CALCIUM: 9.3 mg/dL (ref 8.4–10.5)
CHLORIDE: 102 meq/L (ref 96–112)
CO2: 28 meq/L (ref 19–32)
Creat: 1.5 mg/dL — ABNORMAL HIGH (ref 0.50–1.35)
GFR, EST NON AFRICAN AMERICAN: 47 mL/min — AB
GFR, Est African American: 55 mL/min — ABNORMAL LOW
GLUCOSE: 105 mg/dL — AB (ref 70–99)
POTASSIUM: 4.3 meq/L (ref 3.5–5.3)
Sodium: 139 mEq/L (ref 135–145)
Total Bilirubin: 1.3 mg/dL — ABNORMAL HIGH (ref 0.2–1.2)
Total Protein: 6.4 g/dL (ref 6.0–8.3)

## 2014-01-29 LAB — POCT GLYCOSYLATED HEMOGLOBIN (HGB A1C): Hemoglobin A1C: 6

## 2014-01-29 MED ORDER — HYDROCODONE-HOMATROPINE 5-1.5 MG/5ML PO SYRP: 5.0000 mL | ORAL_SOLUTION | Freq: Every evening | ORAL | Status: DC | PRN

## 2014-01-29 NOTE — Progress Notes (Signed)
Subjective:    Tracy Bennett is a 68 y.o. male who presents for Medicare Annual/Subsequent preventive examination.   Preventive Screening-Counseling & Management  Tobacco History  Smoking status  . Former Smoker  . Quit date: 02/09/1980  Smokeless tobacco  . Not on file    Problems Prior to Visit 1. Has been traveling and woke up not feeling well. Some chest congestion.  + cough.  No ST. Voice is raspy.  No sinus congestion.   2. Obseity. Lost 7 lbs in 11 weeks with weight watchers.   Current Problems (verified) Patient Active Problem List   Diagnosis Date Noted  . BPH (benign prostatic hyperplasia) 01/28/2013  . Obesity 02/09/2012  . Hypertension 10/12/2011  . GERD (gastroesophageal reflux disease) 10/12/2011  . Hyperlipidemia 10/12/2011    Medications Prior to Visit Current Outpatient Prescriptions on File Prior to Visit  Medication Sig Dispense Refill  . esomeprazole (NEXIUM) 40 MG capsule Take 1 capsule (40 mg total) by mouth daily before breakfast. Brand only  90 capsule  1  . finasteride (PROSCAR) 5 MG tablet Take 5 mg by mouth daily.      . simvastatin (ZOCOR) 40 MG tablet TAKE 1 TABLET (40 MG TOTAL) BY MOUTH AT BEDTIME.  90 tablet  0  . Tamsulosin HCl (FLOMAX) 0.4 MG CAPS Take by mouth.      Marland Kitchen tiZANidine (ZANAFLEX) 4 MG tablet Take 1 tablet (4 mg total) by mouth every 8 (eight) hours as needed.  30 tablet  0  . valsartan-hydrochlorothiazide (DIOVAN-HCT) 80-12.5 MG per tablet Take 1 tablet by mouth daily.  90 tablet  1   No current facility-administered medications on file prior to visit.    Current Medications (verified) Current Outpatient Prescriptions  Medication Sig Dispense Refill  . esomeprazole (NEXIUM) 40 MG capsule Take 1 capsule (40 mg total) by mouth daily before breakfast. Brand only  90 capsule  1  . finasteride (PROSCAR) 5 MG tablet Take 5 mg by mouth daily.      . simvastatin (ZOCOR) 40 MG tablet TAKE 1 TABLET (40 MG TOTAL) BY MOUTH AT BEDTIME.   90 tablet  0  . Tamsulosin HCl (FLOMAX) 0.4 MG CAPS Take by mouth.      Marland Kitchen tiZANidine (ZANAFLEX) 4 MG tablet Take 1 tablet (4 mg total) by mouth every 8 (eight) hours as needed.  30 tablet  0  . valsartan-hydrochlorothiazide (DIOVAN-HCT) 80-12.5 MG per tablet Take 1 tablet by mouth daily.  90 tablet  1   No current facility-administered medications for this visit.     Allergies (verified) Lisinopril   PAST HISTORY  Family History Family History  Problem Relation Age of Onset  . COPD Mother     Social History History  Substance Use Topics  . Smoking status: Former Smoker    Quit date: 02/09/1980  . Smokeless tobacco: Not on file  . Alcohol Use: Yes    Are there smokers in your home (other than you)?  No  Risk Factors Current exercise habits: The patient does not participate in regular exercise at present.  Dietary issues discussed: None  Cardiac risk factors: advanced age (older than 36 for men, 48 for women), dyslipidemia, hypertension, male gender, obesity (BMI >= 30 kg/m2) and sedentary lifestyle.  Depression Screen (Note: if answer to either of the following is "Yes", a more complete depression screening is indicated)   Q1: Over the past two weeks, have you felt down, depressed or hopeless? No  Q2: Over the past two  weeks, have you felt little interest or pleasure in doing things? No  Have you lost interest or pleasure in daily life? No  Do you often feel hopeless? No  Do you cry easily over simple problems? No  Activities of Daily Living In your present state of health, do you have any difficulty performing the following activities?:  Driving? No Managing money?  No Feeding yourself? No Getting from bed to chair? No Climbing a flight of stairs? No Preparing food and eating?: No Bathing or showering? No Getting dressed: No Getting to the toilet? No Using the toilet:No Moving around from place to place: No In the past year have you fallen or had a near  fall?:No   Hearing Difficulties: No Do you often ask people to speak up or repeat themselves? No Do you experience ringing or noises in your ears? No Do you have difficulty understanding soft or whispered voices? No   Do you feel that you have a problem with memory? No  Do you often misplace items? No  Do you feel safe at home?  Yes  Cognitive Testing  Alert? Yes  Normal Appearance?Yes  Oriented to person? Yes  Place? Yes   Time? Yes  Recall of three objects?  Yes  Can perform simple calculations? Yes  Displays appropriate judgment?Yes  Can read the correct time from a watch face?Yes   Advanced Directives have been discussed with the patient? Yes   List the Names of Other Physician/Practitioners you currently use: 1.  GI 2. Urology for BPH.   Indicate any recent Medical Services you may have received from other than [facility] providers in the past year (date may be approximate).  Immunization History  Administered Date(s) Administered  . Influenza Split 11/27/2012, 12/16/2012  . Influenza, Seasonal, Injecte, Preservative Fre 12/11/2013  . Pneumococcal Polysaccharide-23 02/09/2012  . Tdap 01/28/2013    Screening Tests Health Maintenance  Topic Date Due  . Influenza Vaccine  06/27/2014  . Tetanus/tdap  01/29/2023  . Colonoscopy  10/01/2023  . Pneumococcal Polysaccharide Vaccine Age 2 And Over  Completed  . Zostavax  Addressed    All answers were reviewed with the patient and necessary referrals were made:  Tracy Furgason, MD   01/29/2014   History reviewed: allergies, current medications, past family history, past medical history, past social history, past surgical history and problem list  Review of Systems A comprehensive review of systems was negative.    Objective:     Vision Exam UTD - will call to get report.   BP 115/73  Pulse 81  Temp(Src) 97.9 F (36.6 C)  Ht 5\' 10"  (1.778 m)  Wt 312 lb (141.522 kg)  BMI 44.77 kg/m2  SpO2 95%  General  Appearance:    Alert, cooperative, no distress, appears stated age  Head:    Normocephalic, without obvious abnormality, atraumatic  Eyes:    PERRL, conjunctiva/corneas clear, EOM's intact, both eyes       Ears:    Normal TM's and external ear canals, both ears  Nose:   Nares normal, septum midline, mucosa normal, no drainage    or sinus tenderness  Throat:   Lips, mucosa, and tongue normal; teeth and gums normal  Neck:   Supple, symmetrical, trachea midline, no adenopathy;       thyroid:  No enlargement/tenderness/nodules; no carotid   bruit or JVD  Back:     Symmetric, no curvature, ROM normal, no CVA tenderness  Lungs:     Clear to auscultation bilaterally,  respirations unlabored  Chest wall:    No tenderness or deformity  Heart:    Regular rate and rhythm, S1 and S2 normal, no murmur, rub   or gallop  Abdomen:     Soft, non-tender, bowel sounds active all four quadrants,    no masses, no organomegaly  Genitalia:    Not peformed.   Rectal:    Not peformed.   Extremities:   Extremities normal, atraumatic, no cyanosis or edema  Pulses:   2+ and symmetric all extremities  Skin:   Skin color, texture, turgor normal, no rashes or lesions  Lymph nodes:   Cervical, supraclavicular, and axillary nodes normal  Neurologic:   CNII-XII intact. Normal strength, sensation and reflexes      throughout       Assessment:     Annual Medicare Wellness Exam     Plan:     During the course of the visit the patient was educated and counseled about appropriate screening and preventive services including:    Colorectal cancer screening UTD.   HTN - well controlled.     Discussed advanced directives  BMI 44 - discussed need for weight loss. He is interested in medical weight loss through a clinic. No regular exercies right now.    Hyperlpidemian - will repeat lipids.   Diet review for nutrition referral? Yes ____  Not Indicated _X_   Patient Instructions (the written plan) was given to  the patient.  Medicare Attestation I have personally reviewed: The patient's medical and social history Their use of alcohol, tobacco or illicit drugs Their current medications and supplements The patient's functional ability including ADLs,fall risks, home safety risks, cognitive, and hearing and visual impairment Diet and physical activities Evidence for depression or mood disorders  The patient's weight, height, BMI, and visual acuity have been recorded in the chart.  I have made referrals, counseling, and provided education to the patient based on review of the above and I have provided the patient with a written personalized care plan for preventive services.     Tennie Grussing, MD   01/29/2014

## 2014-01-29 NOTE — Patient Instructions (Signed)
Keep up a regular exercise program and make sure you are eating a healthy diet Try to eat 4 servings of dairy a day, or if you are lactose intolerant take a calcium with vitamin D daily.  Your vaccines are up to date.   

## 2014-01-30 ENCOUNTER — Other Ambulatory Visit: Payer: Self-pay | Admitting: *Deleted

## 2014-01-30 DIAGNOSIS — R7989 Other specified abnormal findings of blood chemistry: Secondary | ICD-10-CM

## 2014-01-30 LAB — CORTISOL: Cortisol, Plasma: 9.5 ug/dL

## 2014-01-30 LAB — PSA: PSA: 0.66 ng/mL (ref <=4.00)

## 2014-02-02 ENCOUNTER — Other Ambulatory Visit: Payer: Self-pay | Admitting: Family Medicine

## 2014-02-02 ENCOUNTER — Other Ambulatory Visit: Payer: Self-pay | Admitting: *Deleted

## 2014-02-02 ENCOUNTER — Telehealth: Payer: Self-pay | Admitting: *Deleted

## 2014-02-02 MED ORDER — DOXYCYCLINE HYCLATE 100 MG PO TABS
100.0000 mg | ORAL_TABLET | Freq: Two times a day (BID) | ORAL | Status: DC
Start: 2014-02-02 — End: 2014-02-12

## 2014-02-02 MED ORDER — DOXYCYCLINE HYCLATE 100 MG PO TABS: 100.0000 mg | ORAL_TABLET | Freq: Two times a day (BID) | ORAL | Status: DC

## 2014-02-02 MED ORDER — HYDROCOD POLST-CHLORPHEN POLST 10-8 MG/5ML PO LQCR: 5.0000 mL | Freq: Every evening | ORAL | Status: DC | PRN

## 2014-02-02 NOTE — Telephone Encounter (Signed)
Pt informed and will call if no better. Another cough med sent. Pt stated that he was not happy with the process of calling in and not being able to speak to a person when he calls or having to lvm and his call not being returned in a timely manner. I told pt that I understood his frustration and apologized for the inconvenience that this has caused.Tracy Bennett

## 2014-02-10 ENCOUNTER — Encounter: Payer: No Typology Code available for payment source | Admitting: Family Medicine

## 2014-02-12 ENCOUNTER — Ambulatory Visit (INDEPENDENT_AMBULATORY_CARE_PROVIDER_SITE_OTHER): Payer: Medicare Other | Admitting: Family Medicine

## 2014-02-12 ENCOUNTER — Encounter: Payer: Self-pay | Admitting: Family Medicine

## 2014-02-12 VITALS — BP 135/82 | HR 90 | Temp 97.8°F | Wt 312.0 lb

## 2014-02-12 DIAGNOSIS — J209 Acute bronchitis, unspecified: Secondary | ICD-10-CM | POA: Diagnosis not present

## 2014-02-12 MED ORDER — AZITHROMYCIN 250 MG PO TABS: ORAL_TABLET | ORAL | Status: DC

## 2014-02-12 NOTE — Progress Notes (Signed)
   Subjective:    Patient ID: Tracy Bennett, male    DOB: December 13, 1945, 68 y.o.   MRN: [id number]  HPI Still not feeling well. He still feels like has a lot of phlegm and mucus in the upper part of his chest and throat area. He did complete the doxycycline without any significant improvement in his symptoms. He is still coughing.  Says used a zpack in the past. Will hear a rattle in his chest when he lays down.  Sputum is yellow. No fever, chills or sweats.  No sinus sxs. No HA.     Review of Systems     Objective:   Physical Exam  Constitutional: He is oriented to person, place, and time. He appears well-developed and well-nourished.  HENT:  Head: Normocephalic and atraumatic.  Right Ear: External ear normal.  Left Ear: External ear normal.  Nose: Nose normal.  Mouth/Throat: Oropharynx is clear and moist.  TMs and canals are clear.   Eyes: Conjunctivae and EOM are normal. Pupils are equal, round, and reactive to light.  Neck: Neck supple. No thyromegaly present.  Cardiovascular: Normal rate and normal heart sounds.   Pulmonary/Chest: Effort normal and breath sounds normal.  Lymphadenopathy:    He has no cervical adenopathy.  Neurological: He is alert and oriented to person, place, and time.  Skin: Skin is warm and dry.  Psychiatric: He has a normal mood and affect.          Assessment & Plan:  Bronchitis - explained to him that this still could be viral. He's not expensing any fevers. He is coughing so hard that he's worried he may have fractured his ribs on his left lower side underneath the axilla. I did press on them and he was not significantly tender sooner to hold off on x-rays today. I am to go ahead and put him on azithromycin since he has responded well to this in the past. He is a former smoker but has no known history of COPD. If she's not least 50% better by Monday then asked him to call the office back and we will get a chest x-ray. If the rib pain continues or gets  worse then consider rib films to evaluate for fracture.

## 2014-02-12 NOTE — Patient Instructions (Addendum)
If not better (at least 50% better) by Monday then call and we will get a chest xray.

## 2014-02-23 DIAGNOSIS — I10 Essential (primary) hypertension: Secondary | ICD-10-CM | POA: Diagnosis not present

## 2014-02-23 DIAGNOSIS — R799 Abnormal finding of blood chemistry, unspecified: Secondary | ICD-10-CM | POA: Diagnosis not present

## 2014-02-23 DIAGNOSIS — R7309 Other abnormal glucose: Secondary | ICD-10-CM | POA: Diagnosis not present

## 2014-03-02 ENCOUNTER — Ambulatory Visit (INDEPENDENT_AMBULATORY_CARE_PROVIDER_SITE_OTHER): Payer: Medicare Other

## 2014-03-02 ENCOUNTER — Ambulatory Visit (INDEPENDENT_AMBULATORY_CARE_PROVIDER_SITE_OTHER): Payer: Medicare Other | Admitting: Family Medicine

## 2014-03-02 ENCOUNTER — Encounter: Payer: Self-pay | Admitting: Family Medicine

## 2014-03-02 VITALS — BP 144/83 | HR 75 | Wt 312.0 lb

## 2014-03-02 DIAGNOSIS — J209 Acute bronchitis, unspecified: Secondary | ICD-10-CM | POA: Diagnosis not present

## 2014-03-02 DIAGNOSIS — R059 Cough, unspecified: Secondary | ICD-10-CM

## 2014-03-02 DIAGNOSIS — R0602 Shortness of breath: Secondary | ICD-10-CM

## 2014-03-02 DIAGNOSIS — R05 Cough: Secondary | ICD-10-CM

## 2014-03-02 MED ORDER — HYDROCOD POLST-CHLORPHEN POLST 10-8 MG/5ML PO LQCR: 5.0000 mL | Freq: Every evening | ORAL | Status: DC | PRN

## 2014-03-02 MED ORDER — PREDNISONE 20 MG PO TABS: 40.0000 mg | ORAL_TABLET | Freq: Every day | ORAL | Status: DC

## 2014-03-02 MED ORDER — IPRATROPIUM-ALBUTEROL 0.5-2.5 (3) MG/3ML IN SOLN: 3.0000 mL | RESPIRATORY_TRACT | Status: DC

## 2014-03-02 MED ORDER — IPRATROPIUM-ALBUTEROL 0.5-2.5 (3) MG/3ML IN SOLN
3.0000 mL | Freq: Once | RESPIRATORY_TRACT | Status: AC
  Administered 2014-03-02: 3 mL via RESPIRATORY_TRACT

## 2014-03-02 NOTE — Progress Notes (Signed)
   Subjective:    Patient ID: Tracy Bennett, male    DOB: 03-Mar-1946, 68 y.o.   MRN: [id number]  HPI Dx with acute bronchitis about 16 days ago. Did feel better on teh zpack. Says overall felt much better but not 100%.  Was able to sleep thorugh the night.  Then 5 days ago cut a tree down with a chain saw and start coughing and feeling SOB again.Feels like back to where he was. Hasn't sleeped in 2 nights.  Coughs until lightheaded.  sinsuses are clear.  Cough is dry.  No fever, chills or sweats today.  Maybe some chills last night.  Cough is worse w/ laying down. No hx of asthma as a child.    Review of Systems     Objective:   Physical Exam  Constitutional: He is oriented to person, place, and time. He appears well-developed and well-nourished.  HENT:  Head: Normocephalic and atraumatic.  Right Ear: External ear normal.  Left Ear: External ear normal.  Nose: Nose normal.  Mouth/Throat: Oropharynx is clear and moist.  TMs and canals are clear.   Eyes: Conjunctivae and EOM are normal. Pupils are equal, round, and reactive to light.  Neck: Neck supple. No thyromegaly present.  Cardiovascular: Normal rate and normal heart sounds.   Pulmonary/Chest: Effort normal and breath sounds normal.  Unable to hear his stomach growling in his left upper chest.  Lymphadenopathy:    He has no cervical adenopathy.  Neurological: He is alert and oriented to person, place, and time.  Skin: Skin is warm and dry.  Psychiatric: He has a normal mood and affect.          Assessment & Plan:  Acute bronchitis - cough is very forceful to the point that he is having rib pain again. We'll send for chest x-ray today. We'll call the results once available. Also would like to do peak flows on him. No prior history of asthma he feels very short of breath with this cough.  Peak flows in the yellow. Givne neb tx here in the office.  Will tx with 5 days of prednisone. CXR is normal. No sign of infection on exam  or Chest xray.  Given sample Proair to use 2 puffs every 4 hours as needed for SOB, cough.

## 2014-03-24 DIAGNOSIS — R799 Abnormal finding of blood chemistry, unspecified: Secondary | ICD-10-CM | POA: Diagnosis not present

## 2014-03-24 DIAGNOSIS — I10 Essential (primary) hypertension: Secondary | ICD-10-CM | POA: Diagnosis not present

## 2014-03-24 DIAGNOSIS — R7309 Other abnormal glucose: Secondary | ICD-10-CM | POA: Diagnosis not present

## 2014-04-10 DIAGNOSIS — R7309 Other abnormal glucose: Secondary | ICD-10-CM | POA: Diagnosis not present

## 2014-04-10 DIAGNOSIS — G478 Other sleep disorders: Secondary | ICD-10-CM | POA: Diagnosis not present

## 2014-04-10 DIAGNOSIS — I10 Essential (primary) hypertension: Secondary | ICD-10-CM | POA: Diagnosis not present

## 2014-04-27 DIAGNOSIS — S90129A Contusion of unspecified lesser toe(s) without damage to nail, initial encounter: Secondary | ICD-10-CM | POA: Diagnosis not present

## 2014-04-29 DIAGNOSIS — B368 Other specified superficial mycoses: Secondary | ICD-10-CM | POA: Diagnosis not present

## 2014-05-06 DIAGNOSIS — B368 Other specified superficial mycoses: Secondary | ICD-10-CM | POA: Diagnosis not present

## 2014-05-06 DIAGNOSIS — H60399 Other infective otitis externa, unspecified ear: Secondary | ICD-10-CM | POA: Diagnosis not present

## 2014-05-13 ENCOUNTER — Other Ambulatory Visit: Payer: Self-pay | Admitting: *Deleted

## 2014-05-13 MED ORDER — FINASTERIDE 5 MG PO TABS: 5.0000 mg | ORAL_TABLET | Freq: Every day | ORAL | Status: AC

## 2014-06-01 DIAGNOSIS — G478 Other sleep disorders: Secondary | ICD-10-CM | POA: Diagnosis not present

## 2014-06-01 DIAGNOSIS — I10 Essential (primary) hypertension: Secondary | ICD-10-CM | POA: Diagnosis not present

## 2014-06-01 DIAGNOSIS — R7301 Impaired fasting glucose: Secondary | ICD-10-CM | POA: Diagnosis not present

## 2014-06-02 DIAGNOSIS — M25569 Pain in unspecified knee: Secondary | ICD-10-CM | POA: Diagnosis not present

## 2014-06-02 DIAGNOSIS — N400 Enlarged prostate without lower urinary tract symptoms: Secondary | ICD-10-CM | POA: Diagnosis not present

## 2014-06-25 DIAGNOSIS — R0609 Other forms of dyspnea: Secondary | ICD-10-CM | POA: Diagnosis not present

## 2014-06-25 DIAGNOSIS — I10 Essential (primary) hypertension: Secondary | ICD-10-CM | POA: Diagnosis not present

## 2014-07-06 ENCOUNTER — Other Ambulatory Visit: Payer: Self-pay | Admitting: Family Medicine

## 2014-07-15 DIAGNOSIS — G479 Sleep disorder, unspecified: Secondary | ICD-10-CM | POA: Diagnosis not present

## 2014-07-15 DIAGNOSIS — I10 Essential (primary) hypertension: Secondary | ICD-10-CM | POA: Diagnosis not present

## 2014-07-15 DIAGNOSIS — R0609 Other forms of dyspnea: Secondary | ICD-10-CM | POA: Diagnosis not present

## 2014-07-24 DIAGNOSIS — I10 Essential (primary) hypertension: Secondary | ICD-10-CM | POA: Diagnosis not present

## 2014-07-24 DIAGNOSIS — G4733 Obstructive sleep apnea (adult) (pediatric): Secondary | ICD-10-CM | POA: Diagnosis not present

## 2014-07-24 DIAGNOSIS — R0609 Other forms of dyspnea: Secondary | ICD-10-CM | POA: Diagnosis not present

## 2014-07-28 DIAGNOSIS — R7301 Impaired fasting glucose: Secondary | ICD-10-CM | POA: Diagnosis not present

## 2014-07-28 DIAGNOSIS — G4733 Obstructive sleep apnea (adult) (pediatric): Secondary | ICD-10-CM | POA: Diagnosis not present

## 2014-07-28 DIAGNOSIS — I10 Essential (primary) hypertension: Secondary | ICD-10-CM | POA: Diagnosis not present

## 2014-08-06 DIAGNOSIS — H6120 Impacted cerumen, unspecified ear: Secondary | ICD-10-CM | POA: Diagnosis not present

## 2014-08-07 DIAGNOSIS — I10 Essential (primary) hypertension: Secondary | ICD-10-CM | POA: Diagnosis not present

## 2014-08-07 DIAGNOSIS — G4733 Obstructive sleep apnea (adult) (pediatric): Secondary | ICD-10-CM | POA: Diagnosis not present

## 2014-08-07 DIAGNOSIS — R0989 Other specified symptoms and signs involving the circulatory and respiratory systems: Secondary | ICD-10-CM | POA: Diagnosis not present

## 2014-08-07 DIAGNOSIS — R0609 Other forms of dyspnea: Secondary | ICD-10-CM | POA: Diagnosis not present

## 2014-08-27 DIAGNOSIS — G4733 Obstructive sleep apnea (adult) (pediatric): Secondary | ICD-10-CM | POA: Diagnosis not present

## 2014-08-27 DIAGNOSIS — I10 Essential (primary) hypertension: Secondary | ICD-10-CM | POA: Diagnosis not present

## 2014-08-27 DIAGNOSIS — K219 Gastro-esophageal reflux disease without esophagitis: Secondary | ICD-10-CM | POA: Diagnosis not present

## 2014-08-27 DIAGNOSIS — Z6841 Body Mass Index (BMI) 40.0 and over, adult: Secondary | ICD-10-CM | POA: Diagnosis not present

## 2014-09-01 ENCOUNTER — Encounter: Payer: Self-pay | Admitting: Family Medicine

## 2014-09-01 ENCOUNTER — Ambulatory Visit (INDEPENDENT_AMBULATORY_CARE_PROVIDER_SITE_OTHER): Payer: Medicare Other | Admitting: Family Medicine

## 2014-09-01 VITALS — BP 151/76 | HR 68 | Temp 97.5°F | Ht 70.0 in | Wt 323.0 lb

## 2014-09-01 DIAGNOSIS — I10 Essential (primary) hypertension: Secondary | ICD-10-CM | POA: Diagnosis not present

## 2014-09-01 DIAGNOSIS — R7301 Impaired fasting glucose: Secondary | ICD-10-CM | POA: Diagnosis not present

## 2014-09-01 DIAGNOSIS — E118 Type 2 diabetes mellitus with unspecified complications: Secondary | ICD-10-CM | POA: Insufficient documentation

## 2014-09-01 DIAGNOSIS — E1122 Type 2 diabetes mellitus with diabetic chronic kidney disease: Secondary | ICD-10-CM | POA: Insufficient documentation

## 2014-09-01 DIAGNOSIS — G4733 Obstructive sleep apnea (adult) (pediatric): Secondary | ICD-10-CM | POA: Diagnosis not present

## 2014-09-01 LAB — POCT GLYCOSYLATED HEMOGLOBIN (HGB A1C): HEMOGLOBIN A1C: 6.1

## 2014-09-01 NOTE — Progress Notes (Signed)
Subjective:    Patient ID: Tracy Bennett, male    DOB: 02-Mar-1946, 68 y.o.   MRN: [id number]  HPI Hypertension- Pt denies chest pain, SOB, dizziness, or heart palpitations.  Taking meds as directed w/o problems.  Denies medication side effects.  He has gained 11 pounds since his last office visit.  Says home BPs running 130s/70s.  He didn't take his medications today, as he was fasting.    IFG - No inc thrist or urination.   Obesity - He went to seminar about weight loss surgery with Dr. Loyal Gambler. He is thinking about gastric sleeve.  He went for sleep study and was tested  Got a Bipap machine 2 weeks ago for OSA.  He has noticed big improvement in his energy level and feels more rested and feels more motivated.    Review of Systems  BP 151/76  Pulse 68  Temp(Src) 97.5 F (36.4 C)  Ht 5\' 10"  (1.778 m)  Wt 323 lb (146.512 kg)  BMI 46.35 kg/m2    Allergies  Allergen Reactions  . Amoxicillin Swelling    Tongue sores  . Contrast Media [Iodinated Diagnostic Agents] Shortness Of Breath  . Lisinopril Cough    Past Medical History  Diagnosis Date  . Hypertension   . Hyperlipidemia   . Kidney stones     x 4   . GERD (gastroesophageal reflux disease)     Past Surgical History  Procedure Laterality Date  . Left knee surgery      ACL repair   . Umbilical hernia repair    . Carpal tunnel release      Right   . Cervical fusion      C5-6  . Foot tendon surgery  11/14/2011    Dr. Sharol Given    History   Social History  . Marital Status: Married    Spouse Name: Kalman Shan     Number of Children: 1  . Years of Education: N/A   Occupational History  . Retired      [employer]    Social History Main Topics  . Smoking status: Former Smoker    Quit date: 02/09/1980  . Smokeless tobacco: Not on file  . Alcohol Use: Yes  . Drug Use: No  . Sexual Activity: Yes   Other Topics Concern  . Not on file   Social History Narrative   Golf about 2 times per week.  No regular  exercise.     Family History  Problem Relation Age of Onset  . COPD Mother     Outpatient Encounter Prescriptions as of 09/01/2014  Medication Sig  . esomeprazole (NEXIUM) 40 MG capsule Take 1 capsule (40 mg total) by mouth daily before breakfast. Brand only  . finasteride (PROSCAR) 5 MG tablet Take 1 tablet (5 mg total) by mouth daily.  . Fluocinolone Acetonide 0.01 % OIL Place in ear(s).  . Probiotic Product (ALIGN PO) Take by mouth.  . simvastatin (ZOCOR) 40 MG tablet TAKE 1 TABLET (40 MG TOTAL) BY MOUTH AT BEDTIME.  . Tamsulosin HCl (FLOMAX) 0.4 MG CAPS Take by mouth.  . valsartan-hydrochlorothiazide (DIOVAN-HCT) 80-12.5 MG per tablet TAKE 1 TABLET BY MOUTH DAILY. GENERIC PLEASE  . [DISCONTINUED] azithromycin (ZITHROMAX) 250 MG tablet 2 tabs on Day 1, then 1 a day x 4 days.  . [DISCONTINUED] chlorpheniramine-HYDROcodone (TUSSIONEX PENNKINETIC ER) 10-8 MG/5ML LQCR Take 5 mLs by mouth at bedtime as needed for cough.  . [DISCONTINUED] predniSONE (DELTASONE) 20 MG tablet Take 2  tablets (40 mg total) by mouth daily.          Objective:   Physical Exam  Constitutional: He is oriented to person, place, and time. He appears well-developed and well-nourished.  HENT:  Head: Normocephalic and atraumatic.  Cardiovascular: Normal rate, regular rhythm and normal heart sounds.   Pulmonary/Chest: Effort normal and breath sounds normal.  Neurological: He is alert and oriented to person, place, and time.  Skin: Skin is warm and dry.  Psychiatric: He has a normal mood and affect. His behavior is normal.          Assessment & Plan:  HTN - uncontrolled initially. Repeat blood pressure borderline at goal but he did not take his medication this morning which is most likely why is mildly elevated. It sounds like home blood pressures have been well controlled and look fantastic.  IFG - A1c is up just a little bit compared to previous. Continue work on diet and exercise. Followup in 6  months. Lab Results  Component Value Date   HGBA1C 6.1 09/01/2014    Obesity/BMI greater than 40-he is here so considering gastric sleeve surgery. He understands it will be a significant lifestyle change. A really think he has a very good grasp on this. He still trying to decide if this is best for him or not. We did discuss the potential benefits as far as his health is concerned but others also the significant lifestyle changes. In the meantime certainly continue to encourage him to work on diet and exercise in the am when deciding what he would like to do.

## 2014-09-03 DIAGNOSIS — B368 Other specified superficial mycoses: Secondary | ICD-10-CM | POA: Diagnosis not present

## 2014-09-03 DIAGNOSIS — H6063 Unspecified chronic otitis externa, bilateral: Secondary | ICD-10-CM | POA: Diagnosis not present

## 2014-09-10 DIAGNOSIS — H6063 Unspecified chronic otitis externa, bilateral: Secondary | ICD-10-CM | POA: Diagnosis not present

## 2014-09-10 DIAGNOSIS — B368 Other specified superficial mycoses: Secondary | ICD-10-CM | POA: Diagnosis not present

## 2014-09-15 DIAGNOSIS — E668 Other obesity: Secondary | ICD-10-CM | POA: Diagnosis not present

## 2014-09-15 DIAGNOSIS — F54 Psychological and behavioral factors associated with disorders or diseases classified elsewhere: Secondary | ICD-10-CM | POA: Diagnosis not present

## 2014-09-17 ENCOUNTER — Other Ambulatory Visit: Payer: Self-pay | Admitting: Family Medicine

## 2014-09-21 DIAGNOSIS — I10 Essential (primary) hypertension: Secondary | ICD-10-CM | POA: Diagnosis not present

## 2014-09-21 DIAGNOSIS — R5383 Other fatigue: Secondary | ICD-10-CM | POA: Diagnosis not present

## 2014-09-21 DIAGNOSIS — G4733 Obstructive sleep apnea (adult) (pediatric): Secondary | ICD-10-CM | POA: Diagnosis not present

## 2014-09-22 DIAGNOSIS — G4733 Obstructive sleep apnea (adult) (pediatric): Secondary | ICD-10-CM | POA: Diagnosis not present

## 2014-09-23 DIAGNOSIS — H25813 Combined forms of age-related cataract, bilateral: Secondary | ICD-10-CM | POA: Diagnosis not present

## 2014-09-23 DIAGNOSIS — Z9889 Other specified postprocedural states: Secondary | ICD-10-CM | POA: Diagnosis not present

## 2014-09-28 DIAGNOSIS — R7309 Other abnormal glucose: Secondary | ICD-10-CM | POA: Diagnosis not present

## 2014-09-28 DIAGNOSIS — Z6841 Body Mass Index (BMI) 40.0 and over, adult: Secondary | ICD-10-CM | POA: Diagnosis not present

## 2014-09-28 DIAGNOSIS — G4733 Obstructive sleep apnea (adult) (pediatric): Secondary | ICD-10-CM | POA: Diagnosis not present

## 2014-10-20 DIAGNOSIS — K219 Gastro-esophageal reflux disease without esophagitis: Secondary | ICD-10-CM | POA: Diagnosis not present

## 2014-10-20 DIAGNOSIS — H25813 Combined forms of age-related cataract, bilateral: Secondary | ICD-10-CM | POA: Diagnosis not present

## 2014-10-20 DIAGNOSIS — Z9849 Cataract extraction status, unspecified eye: Secondary | ICD-10-CM | POA: Insufficient documentation

## 2014-10-20 DIAGNOSIS — H25812 Combined forms of age-related cataract, left eye: Secondary | ICD-10-CM | POA: Diagnosis not present

## 2014-10-20 DIAGNOSIS — G4733 Obstructive sleep apnea (adult) (pediatric): Secondary | ICD-10-CM | POA: Diagnosis not present

## 2014-10-20 DIAGNOSIS — I10 Essential (primary) hypertension: Secondary | ICD-10-CM | POA: Diagnosis not present

## 2014-10-21 DIAGNOSIS — Z961 Presence of intraocular lens: Secondary | ICD-10-CM | POA: Diagnosis not present

## 2014-10-21 DIAGNOSIS — Z9842 Cataract extraction status, left eye: Secondary | ICD-10-CM | POA: Diagnosis not present

## 2014-10-30 ENCOUNTER — Other Ambulatory Visit: Payer: Self-pay | Admitting: Family Medicine

## 2014-11-03 DIAGNOSIS — K219 Gastro-esophageal reflux disease without esophagitis: Secondary | ICD-10-CM | POA: Diagnosis not present

## 2014-11-03 DIAGNOSIS — I10 Essential (primary) hypertension: Secondary | ICD-10-CM | POA: Diagnosis not present

## 2014-11-03 DIAGNOSIS — H25811 Combined forms of age-related cataract, right eye: Secondary | ICD-10-CM | POA: Diagnosis not present

## 2014-11-03 DIAGNOSIS — H2511 Age-related nuclear cataract, right eye: Secondary | ICD-10-CM | POA: Diagnosis not present

## 2014-11-03 DIAGNOSIS — G4733 Obstructive sleep apnea (adult) (pediatric): Secondary | ICD-10-CM | POA: Diagnosis not present

## 2014-11-04 ENCOUNTER — Telehealth: Payer: Self-pay | Admitting: Family Medicine

## 2014-11-04 ENCOUNTER — Ambulatory Visit (INDEPENDENT_AMBULATORY_CARE_PROVIDER_SITE_OTHER): Payer: Medicare Other | Admitting: Family Medicine

## 2014-11-04 ENCOUNTER — Encounter: Payer: Self-pay | Admitting: Family Medicine

## 2014-11-04 VITALS — BP 124/74 | HR 61 | Temp 96.7°F | Ht 70.0 in | Wt 300.0 lb

## 2014-11-04 DIAGNOSIS — Z9841 Cataract extraction status, right eye: Secondary | ICD-10-CM | POA: Diagnosis not present

## 2014-11-04 DIAGNOSIS — R9431 Abnormal electrocardiogram [ECG] [EKG]: Secondary | ICD-10-CM

## 2014-11-04 DIAGNOSIS — G4733 Obstructive sleep apnea (adult) (pediatric): Secondary | ICD-10-CM | POA: Diagnosis not present

## 2014-11-04 DIAGNOSIS — R7301 Impaired fasting glucose: Secondary | ICD-10-CM | POA: Diagnosis not present

## 2014-11-04 DIAGNOSIS — Z961 Presence of intraocular lens: Secondary | ICD-10-CM | POA: Diagnosis not present

## 2014-11-04 DIAGNOSIS — R009 Unspecified abnormalities of heart beat: Secondary | ICD-10-CM | POA: Diagnosis not present

## 2014-11-04 DIAGNOSIS — I10 Essential (primary) hypertension: Secondary | ICD-10-CM | POA: Diagnosis not present

## 2014-11-04 DIAGNOSIS — I4581 Long QT syndrome: Secondary | ICD-10-CM | POA: Diagnosis not present

## 2014-11-04 LAB — POCT GLYCOSYLATED HEMOGLOBIN (HGB A1C): HEMOGLOBIN A1C: 5.8

## 2014-11-04 NOTE — Progress Notes (Signed)
   Subjective:    Patient ID: Tracy Bennett, male    DOB: 09/12/1946, 68 y.o.   MRN: [id number]  HPI Was having preop for eye surgeon.  He had an EKG done there. It showed a prolonged QT interval. He was told to follow-up with his primary care provider. He did bring a copy of the EKG in today. No chest pain or shortness of breath. In fact he says since losing over 20 pounds in the last few months he's actually been able to breathe better. He does have a history of a heart murmur when he was a teenager. He says his BP was really low yesterday too. Says sytolic was under 885 . He says that his heart went back into a normal rhythm before he left. He had the cataract surgery done and had no complications yesterday.  Obesity - doing my Fitness pal online. Has lost 27 lbs in the last 10 weeks.  Bipap is working well and getting much better sleep.  Cut out soda.  Has really but back on sweets. Hasn't been able to exercise for the last 1.5 months.   Review of Systems     Objective:   Physical Exam  Constitutional: He is oriented to person, place, and time. He appears well-developed and well-nourished.  HENT:  Head: Normocephalic and atraumatic.  Cardiovascular: Normal rate and normal heart sounds.   Sounds  Like skips every 3rd beat on exam.   Pulmonary/Chest: Effort normal and breath sounds normal.  Neurological: He is alert and oriented to person, place, and time.  Skin: Skin is warm and dry.  Psychiatric: He has a normal mood and affect. His behavior is normal.          Assessment & Plan:  Prolonged QT- I did review his medications. None of these should actually cause QT prolongation. Though he is on several including the valsartan and hydro-chlorothiazide that can cause electrolyte abnormalities. I did explain to him that it puts him at risk for arrhythmias and syncope. Will evaluate for low potassium and magnesium or calcium. Will place referral for echocardiogram to check LV function.  Then will refer to cardiology.   EKG today shows rate of approximately 70 bpm, normal axis. He has coupled beats followed by a PVC and a repetitive pattern. He is completely symptomatic with no chest pain short of breath dizziness or lightheadedness. We had an old EKG from 2011 which was essentially normal  IFG - will recheck A1C today per his request.  A1c is down to 5.8. Fantastic progress. Recommend recheck in 6 months.  Obesity - has done a great job. He thinks he has decided not to do gastric surgery. He's really done a great job in losing weight on his own and wants to continue work on lifestyle changes. He plans to get back in to some kind of exercise after he heals from his eye surgery.  Sleep apnea-using his BiPAP consistently. He says he's using it for at least 4 hours a night. He's noticed he has much more energy. He is less short of breath during the day. And he doesn't fall asleep and take naps during the daytime now.

## 2014-11-04 NOTE — Telephone Encounter (Signed)
Call patient: Please let him know that I did review his case with her cardiologist. We will go ahead and order an echocardiogram as well to look at the pumping function of his heart. And then we will schedule an appointment with the cardiologist later this month or in early January.

## 2014-11-04 NOTE — Patient Instructions (Signed)
Cut Diovan in half since BP has been low.

## 2014-11-04 NOTE — Telephone Encounter (Signed)
Called and informed pt's wife of recommendations .Audelia Hives [place]

## 2014-11-05 DIAGNOSIS — H6063 Unspecified chronic otitis externa, bilateral: Secondary | ICD-10-CM | POA: Diagnosis not present

## 2014-11-05 DIAGNOSIS — B369 Superficial mycosis, unspecified: Secondary | ICD-10-CM | POA: Diagnosis not present

## 2014-11-05 LAB — MAGNESIUM: Magnesium: 1.7 mg/dL (ref 1.5–2.5)

## 2014-11-05 LAB — COMPLETE METABOLIC PANEL WITH GFR
ALT: 33 U/L (ref 0–53)
AST: 25 U/L (ref 0–37)
Albumin: 4.1 g/dL (ref 3.5–5.2)
Alkaline Phosphatase: 57 U/L (ref 39–117)
BUN: 14 mg/dL (ref 6–23)
CALCIUM: 9.3 mg/dL (ref 8.4–10.5)
CO2: 27 mEq/L (ref 19–32)
CREATININE: 1.23 mg/dL (ref 0.50–1.35)
Chloride: 104 mEq/L (ref 96–112)
GFR, Est African American: 69 mL/min
GFR, Est Non African American: 60 mL/min
Glucose, Bld: 103 mg/dL — ABNORMAL HIGH (ref 70–99)
Potassium: 3.8 mEq/L (ref 3.5–5.3)
Sodium: 138 mEq/L (ref 135–145)
Total Bilirubin: 1.1 mg/dL (ref 0.2–1.2)
Total Protein: 6.4 g/dL (ref 6.0–8.3)

## 2014-11-05 LAB — TSH: TSH: 0.789 u[IU]/mL (ref 0.350–4.500)

## 2014-11-05 NOTE — Progress Notes (Signed)
Quick Note:  All labs are normal. ______ 

## 2014-11-18 ENCOUNTER — Ambulatory Visit (HOSPITAL_BASED_OUTPATIENT_CLINIC_OR_DEPARTMENT_OTHER)
Admission: RE | Admit: 2014-11-18 | Discharge: 2014-11-18 | Disposition: A | Discharge status: Home / Self Care | Payer: Medicare Other | Source: Ambulatory Visit | Attending: Family Medicine | Admitting: Family Medicine

## 2014-11-18 DIAGNOSIS — I517 Cardiomegaly: Secondary | ICD-10-CM | POA: Diagnosis not present

## 2014-11-18 DIAGNOSIS — I10 Essential (primary) hypertension: Secondary | ICD-10-CM | POA: Insufficient documentation

## 2014-11-18 DIAGNOSIS — R9431 Abnormal electrocardiogram [ECG] [EKG]: Secondary | ICD-10-CM | POA: Diagnosis not present

## 2014-11-18 NOTE — Progress Notes (Signed)
Echocardiogram 2D Echocardiogram has been performed.  Tracy Bennett 11/18/2014, 12:21 PM

## 2014-11-23 ENCOUNTER — Encounter: Payer: Self-pay | Admitting: Family Medicine

## 2014-11-23 DIAGNOSIS — I519 Heart disease, unspecified: Secondary | ICD-10-CM | POA: Insufficient documentation

## 2014-11-23 DIAGNOSIS — I119 Hypertensive heart disease without heart failure: Secondary | ICD-10-CM | POA: Insufficient documentation

## 2014-11-24 ENCOUNTER — Other Ambulatory Visit: Payer: Self-pay | Admitting: Family Medicine

## 2014-11-24 MED ORDER — HYDROCHLOROTHIAZIDE 12.5 MG PO CAPS: 12.5000 mg | ORAL_CAPSULE | Freq: Every day | ORAL | Status: DC

## 2014-11-25 ENCOUNTER — Telehealth: Payer: Self-pay | Admitting: *Deleted

## 2014-11-25 ENCOUNTER — Encounter: Payer: Self-pay | Admitting: *Deleted

## 2014-11-25 DIAGNOSIS — H17812 Minor opacity of cornea, left eye: Secondary | ICD-10-CM | POA: Diagnosis not present

## 2014-11-25 DIAGNOSIS — Z9889 Other specified postprocedural states: Secondary | ICD-10-CM | POA: Diagnosis not present

## 2014-11-25 DIAGNOSIS — Z9841 Cataract extraction status, right eye: Secondary | ICD-10-CM | POA: Diagnosis not present

## 2014-11-25 DIAGNOSIS — Z961 Presence of intraocular lens: Secondary | ICD-10-CM | POA: Diagnosis not present

## 2014-11-25 NOTE — Telephone Encounter (Signed)
Called [facility] back spoke w/Jlee Harkless asked for their fax # to send pt's ekg results. [phone].Tracy Bennett [place]

## 2014-11-25 NOTE — Telephone Encounter (Signed)
requested records... lvm for Tonya, Dr. Morrie Sheldon CMA.Marland KitchenMarland Kitchen

## 2014-11-25 NOTE — Telephone Encounter (Signed)
Received EKG that was requested for this pt.Tracy KitchenMarland Bennett

## 2014-11-25 NOTE — Telephone Encounter (Signed)
Brandy lvm requesting copy of pt's EKG he has an appt with Dr. Caryl Comes on 1/6.Audelia Hives [place]

## 2014-12-01 ENCOUNTER — Encounter: Payer: Self-pay | Admitting: Family Medicine

## 2014-12-02 ENCOUNTER — Encounter: Payer: Self-pay | Admitting: Internal Medicine

## 2014-12-02 ENCOUNTER — Ambulatory Visit (INDEPENDENT_AMBULATORY_CARE_PROVIDER_SITE_OTHER): Payer: Medicare Other | Admitting: Internal Medicine

## 2014-12-02 VITALS — BP 124/68 | HR 58 | Ht 69.0 in | Wt 304.4 lb

## 2014-12-02 DIAGNOSIS — I4581 Long QT syndrome: Secondary | ICD-10-CM | POA: Diagnosis not present

## 2014-12-02 DIAGNOSIS — R9431 Abnormal electrocardiogram [ECG] [EKG]: Secondary | ICD-10-CM

## 2014-12-02 NOTE — Patient Instructions (Signed)
Your physician recommends that you continue on your current medications as directed. Please refer to the Current Medication list given to you today.  Your physician has requested that you have an echocardiogram in 6 months at [facility] location. Echocardiography is a painless test that uses sound waves to create images of your heart. It provides your doctor with information about the size and shape of your heart and how well your heart's chambers and valves are working. This procedure takes approximately one hour. There are no restrictions for this procedure.

## 2014-12-02 NOTE — Progress Notes (Signed)
ELECTROPHYSIOLOGY CONSULT NOTE  Patient ID: Tracy Bennett, MRN: [id number], DOB/AGE: [birth year] 70 y.o. Admit date: (Not on file) Date of Consult: 12/02/2014  Primary Physician: Beatrice Lecher, MD Primary Cardiologist: new  Chief Complaint: ? Long QT    HPI Tracy Bennett is a 69 y.o. male  Referred because of an abnormal ECG.  He was undergoing cataract surgery and was noted to have a slow heart rate. An ECG demonstrated trigeminy. This resolved and he underwent surgery without difficulty. He was then referred to his PCP where an ECG again demonstrated bigeminy with couplets. This had an RVOT morphology. The patient was unaware of these beats. Electrolytes were obtained and were normal.  He has had no impairments in exercise tolerance. He underwent echocardiogram 12/15 which demonstrated normal LV function.  He has mild edema.  This has been  treated with hydrochlorthiazide. He has morbid obesity. He has lost 30 pounds over the last 4 months which represents about 8% of his body weight. He is also been treated for his sleep apnea mask improvement  He has hypertension and has been able with weight reduction and OSA tx to have his meds reduced           Past Medical History  Diagnosis Date  . Hypertension   . Hyperlipidemia   . Kidney stones     x 4   . GERD (gastroesophageal reflux disease)       Surgical History:  Past Surgical History  Procedure Laterality Date  . Left knee surgery      ACL repair   . Umbilical hernia repair    . Carpal tunnel release      Right   . Cervical fusion      C5-6  . Foot tendon surgery  11/14/2011    Dr. Sharol Given     Home Meds: Prior to Admission medications   Medication Sig Start Date End Date Taking? Authorizing Provider  finasteride (PROSCAR) 5 MG tablet Take 1 tablet (5 mg total) by mouth daily. 05/13/14  Yes Hali Marry, MD  hydrochlorothiazide (MICROZIDE) 12.5 MG capsule Take 1 capsule (12.5 mg total) by mouth  daily. 11/24/14  Yes Hali Marry, MD  Probiotic Product (ALIGN PO) Take 1 tablet by mouth daily.    Yes Historical Provider, MD  simvastatin (ZOCOR) 40 MG tablet TAKE 1 TABLET (40 MG TOTAL) BY MOUTH AT BEDTIME. 09/17/14  Yes Hali Marry, MD  Tamsulosin HCl (FLOMAX) 0.4 MG CAPS Take 0.4 mg by mouth daily.    Yes Historical Provider, MD  valsartan-hydrochlorothiazide (DIOVAN-HCT) 80-12.5 MG per tablet TAKE 1 TABLET BY MOUTH DAILY. GENERIC PLEASE Patient taking differently: TAKE 1/2 TABLET BY MOUTH DAILY. GENERIC PLEASE 10/30/14  Yes Hali Marry, MD  esomeprazole (NEXIUM) 40 MG capsule Take 1 capsule (40 mg total) by mouth daily before breakfast. Brand only Patient not taking: Reported on 12/02/2014 01/28/13   Hali Marry, MD    *  Allergies:  Allergies  Allergen Reactions  . Amoxicillin Swelling    Tongue sores  . Contrast Media [Iodinated Diagnostic Agents] Shortness Of Breath  . Lisinopril Cough    History   Social History  . Marital Status: Married    Spouse Name: Kalman Shan     Number of Children: 1  . Years of Education: N/A   Occupational History  . Retired      [employer]    Social History Main Topics  . Smoking status: Former Smoker  Quit date: 02/09/1980  . Smokeless tobacco: Not on file  . Alcohol Use: Yes  . Drug Use: No  . Sexual Activity: Yes   Other Topics Concern  . Not on file   Social History Narrative   Golf about 2 times per week.  No regular exercise.      Family History  Problem Relation Age of Onset  . COPD Mother   . Hypertension Mother   . Emphysema Mother      ROS:  Please see the history of present illness.     All other systems reviewed and negative.    Physical Exam: Blood pressure 124/68, pulse 58, height 5\' 9"  (1.753 m), weight 304 lb 6.4 oz (138.075 kg). General: Well developed, well nourished male in no acute distress. Head: Normocephalic, atraumatic, sclera non-icteric, no xanthomas, nares are  without discharge. EENT: normal Lymph Nodes:  none Back: without scoliosis/kyphosis, no CVA tendersness Neck: Negative for carotid bruits. JVD not elevated. Lungs: Clear bilaterally to auscultation without wheezes, rales, or rhonchi. Breathing is unlabored. Heart: RRR with S1 S2. No murmur , rubs, or gallops appreciated. Abdomen: Soft, non-tender, non-distended with normoactive bowel sounds. No hepatomegaly. No rebound/guarding. No obvious abdominal masses. Msk:  Strength and tone appear normal for age. Extremities: No clubbing or cyanosis. No  edema.  Distal pedal pulses are 2+ and equal bilaterally. Skin: Warm and Dry Neuro: Alert and oriented X 3. CN III-XII intact Grossly normal sensory and motor function . Psych:  Responds to questions appropriately with a normal affect.      Labs: Cardiac Enzymes No results for input(s): CKTOTAL, CKMB, TROPONINI in the last 72 hours. CBC No results found for: WBC, HGB, HCT, MCV, PLT PROTIME: No results for input(s): LABPROT, INR in the last 72 hours. Chemistry No results for input(s): NA, K, CL, CO2, BUN, CREATININE, CALCIUM, PROT, BILITOT, ALKPHOS, ALT, AST, GLUCOSE in the last 168 hours.  Invalid input(s): LABALBU Lipids Lab Results  Component Value Date   CHOL 134 01/29/2014   HDL 35* 01/29/2014   LDLCALC 79 01/29/2014   TRIG 101 01/29/2014   BNP No results found for: PROBNP Miscellaneous No results found for: DDIMER  Radiology/Studies:  No results found.  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 In the absence of symptoms, the only indication for intervention would be evidence of PVC associated cardiomyopathy of which there is not  based on recent echo.  Hence, following PVC  burden will be of little consequence. However, following left ventricular function can be useful. I would anticipate a repeat echocardiogram in about 6 months.  There is no abnormality that I can detect in QT interval. No further workup for this is indicated        Virl Axe

## 2014-12-10 ENCOUNTER — Telehealth: Payer: Self-pay | Admitting: Internal Medicine

## 2014-12-10 NOTE — Telephone Encounter (Signed)
New problem:    Pinon Cather # [phone].  If you do not get her please leave a voice mail.   Pt needs clarification on BP Medication VALSARTAN 80-2.5mg  and was told to discontinue by Dr Caryl Comes and take HCTZ 12.5 mg 2x daily.   He has only taken it for 4 days and has taken Valsartan 40-6.25 today.   They would like to know what he should be taking.   Pt is concerned about BP going  Up.  BP today left arm 128/82 right 122/70. Thinks this is because of the med taken this morning.   Please give the office a call. # [phone].

## 2014-12-10 NOTE — Telephone Encounter (Signed)
lmtcb  (when call back inform Dr. Caryl Comes recommends - patient should stop Valsartan/HCTZ combo.  Dr. Madilyn Fireman can follow this and make decision)

## 2014-12-11 ENCOUNTER — Telehealth: Payer: Self-pay | Admitting: *Deleted

## 2014-12-11 NOTE — Telephone Encounter (Signed)
New message ° ° ° ° °Returning Sherri's call °

## 2014-12-11 NOTE — Telephone Encounter (Signed)
Pt came in with his wife yesterday and had concerns about his BP.

## 2014-12-11 NOTE — Telephone Encounter (Signed)
Called and informed pt that I had spoken W/Sherri Dr. Addison Lank nurse and she stated that he was advised NOT to take Valsartan-hctz 80-12.5 mg and to only take HCTZ 12.5 mg BID. She said that he would leave this decision up to Dr. Madilyn Fireman to decide what would be best.  Pt advised that Dr. Madilyn Fireman wants him to take HCTZ 12.5 mg qday, and for him to track his home BP's for the next 2 wks and if this goes over 117 systolic, track the number of x's it does and call our office with readings.  He reports that since he has d/c'd the HCTZ he no longer has a cough. He has only been taking valsartan-hctz 40-6.25 mg and has not had the cough since switching. I told him that I would fwd this information to Dr. Madilyn Fireman for f/u and call back w/recommendations. Pt voiced understanding and agreed.Audelia Hives [place]

## 2014-12-11 NOTE — Telephone Encounter (Signed)
Informed Tonya of Dr. Olin Pia orders/recommendations. She will inform Dr. Madilyn Fireman.

## 2014-12-14 NOTE — Telephone Encounter (Signed)
OK, when runs out I would recommend just do losartan low dose without the diuretic.

## 2014-12-14 NOTE — Telephone Encounter (Signed)
Pt's wife informed and will call back when med is complete.Tracy Bennett

## 2014-12-16 ENCOUNTER — Other Ambulatory Visit: Payer: Self-pay | Admitting: Family Medicine

## 2014-12-17 ENCOUNTER — Encounter: Payer: Self-pay | Admitting: Family Medicine

## 2014-12-21 NOTE — Telephone Encounter (Signed)
I called [facility] and had them resubmit with a different ICD - 10 code - irregular heart rate. Hopefully this will take care of the charge.

## 2014-12-22 ENCOUNTER — Telehealth: Payer: Self-pay

## 2014-12-22 NOTE — Telephone Encounter (Signed)
I'm not sure I have sent the [facility] message to you correctly about this patient's bill from [place]. I called [facility] and had them resubmit the bill under a different code. I sent this message to you. I believe I may need to send the message to you a different way. Should be in your [employer].

## 2014-12-23 DIAGNOSIS — Z9849 Cataract extraction status, unspecified eye: Secondary | ICD-10-CM | POA: Diagnosis not present

## 2014-12-23 DIAGNOSIS — H17812 Minor opacity of cornea, left eye: Secondary | ICD-10-CM | POA: Diagnosis not present

## 2014-12-23 DIAGNOSIS — Z5189 Encounter for other specified aftercare: Secondary | ICD-10-CM | POA: Diagnosis not present

## 2014-12-23 DIAGNOSIS — Z961 Presence of intraocular lens: Secondary | ICD-10-CM | POA: Diagnosis not present

## 2014-12-29 DIAGNOSIS — R7309 Other abnormal glucose: Secondary | ICD-10-CM | POA: Diagnosis not present

## 2014-12-29 DIAGNOSIS — G4733 Obstructive sleep apnea (adult) (pediatric): Secondary | ICD-10-CM | POA: Diagnosis not present

## 2014-12-29 DIAGNOSIS — I10 Essential (primary) hypertension: Secondary | ICD-10-CM | POA: Diagnosis not present

## 2015-03-10 DIAGNOSIS — H6063 Unspecified chronic otitis externa, bilateral: Secondary | ICD-10-CM | POA: Diagnosis not present

## 2015-03-20 ENCOUNTER — Other Ambulatory Visit: Payer: Self-pay | Admitting: Family Medicine

## 2015-06-23 DIAGNOSIS — Z9889 Other specified postprocedural states: Secondary | ICD-10-CM | POA: Diagnosis not present

## 2015-06-23 DIAGNOSIS — H17813 Minor opacity of cornea, bilateral: Secondary | ICD-10-CM | POA: Diagnosis not present

## 2015-06-23 DIAGNOSIS — Z961 Presence of intraocular lens: Secondary | ICD-10-CM | POA: Diagnosis not present

## 2015-07-21 ENCOUNTER — Other Ambulatory Visit: Payer: Self-pay | Admitting: Family Medicine

## 2015-08-19 ENCOUNTER — Other Ambulatory Visit: Payer: Self-pay | Admitting: Family Medicine

## 2015-08-21 ENCOUNTER — Other Ambulatory Visit: Payer: Self-pay | Admitting: Family Medicine

## 2015-08-23 NOTE — Telephone Encounter (Signed)
Called and spoke with Pt regarding need for follow up appointment. Pt states he has enough of these Rx's for 2 weeks and was transferred to scheduling to make an appt.  While on phone with Pt, he informed me he stepped on a rusty nail over the weekend. Pt states it "bled a lot" and he "cleaned it with alcohol." Pt reports no redness, no pain, no edema in the area. His last tdap was 01/28/2013, will route to PCP to make sure no booster required.

## 2015-08-24 DIAGNOSIS — N521 Erectile dysfunction due to diseases classified elsewhere: Secondary | ICD-10-CM | POA: Diagnosis not present

## 2015-08-24 DIAGNOSIS — N200 Calculus of kidney: Secondary | ICD-10-CM | POA: Diagnosis not present

## 2015-08-24 DIAGNOSIS — N401 Enlarged prostate with lower urinary tract symptoms: Secondary | ICD-10-CM | POA: Diagnosis not present

## 2015-08-27 ENCOUNTER — Ambulatory Visit (INDEPENDENT_AMBULATORY_CARE_PROVIDER_SITE_OTHER): Payer: Medicare Other | Admitting: Family Medicine

## 2015-08-27 ENCOUNTER — Encounter: Payer: Self-pay | Admitting: Family Medicine

## 2015-08-27 VITALS — BP 118/65 | HR 74 | Temp 97.7°F | Wt 301.0 lb

## 2015-08-27 DIAGNOSIS — I10 Essential (primary) hypertension: Secondary | ICD-10-CM

## 2015-08-27 DIAGNOSIS — R7301 Impaired fasting glucose: Secondary | ICD-10-CM

## 2015-08-27 DIAGNOSIS — Z23 Encounter for immunization: Secondary | ICD-10-CM

## 2015-08-27 DIAGNOSIS — Z1159 Encounter for screening for other viral diseases: Secondary | ICD-10-CM | POA: Diagnosis not present

## 2015-08-27 DIAGNOSIS — N3281 Overactive bladder: Secondary | ICD-10-CM

## 2015-08-27 DIAGNOSIS — E66813 Obesity, class 3: Secondary | ICD-10-CM | POA: Insufficient documentation

## 2015-08-27 LAB — COMPLETE METABOLIC PANEL WITH GFR
ALT: 33 U/L (ref 9–46)
AST: 24 U/L (ref 10–35)
Albumin: 4.3 g/dL (ref 3.6–5.1)
Alkaline Phosphatase: 52 U/L (ref 40–115)
BUN: 17 mg/dL (ref 7–25)
CHLORIDE: 101 mmol/L (ref 98–110)
CO2: 25 mmol/L (ref 20–31)
Calcium: 9.2 mg/dL (ref 8.6–10.3)
Creat: 1.12 mg/dL (ref 0.70–1.25)
GFR, EST AFRICAN AMERICAN: 77 mL/min (ref >=60)
GFR, EST NON AFRICAN AMERICAN: 67 mL/min (ref >=60)
GLUCOSE: 113 mg/dL — AB (ref 65–99)
POTASSIUM: 4.1 mmol/L (ref 3.5–5.3)
SODIUM: 135 mmol/L (ref 135–146)
Total Bilirubin: 1.1 mg/dL (ref 0.2–1.2)
Total Protein: 6.7 g/dL (ref 6.1–8.1)

## 2015-08-27 LAB — LIPID PANEL
CHOL/HDL RATIO: 3.9 ratio (ref <=5.0)
Cholesterol: 132 mg/dL (ref 125–200)
HDL: 34 mg/dL — AB (ref >=40)
LDL CALC: 75 mg/dL (ref <130)
TRIGLYCERIDES: 113 mg/dL (ref <150)
VLDL: 23 mg/dL (ref <30)

## 2015-08-27 LAB — POCT GLYCOSYLATED HEMOGLOBIN (HGB A1C): Hemoglobin A1C: 5.9

## 2015-08-27 MED ORDER — VALSARTAN-HYDROCHLOROTHIAZIDE 80-12.5 MG PO TABS: ORAL_TABLET | ORAL | Status: DC

## 2015-08-27 MED ORDER — SIMVASTATIN 40 MG PO TABS: 40.0000 mg | ORAL_TABLET | Freq: Every day | ORAL | Status: DC

## 2015-08-27 NOTE — Progress Notes (Signed)
   Subjective:    Patient ID: Tracy Bennett, male    DOB: 02/22/1946, 69 y.o.   MRN: [id number]  HPI Hypertension- Pt denies chest pain, SOB, dizziness, or heart palpitations.  Taking meds as directed w/o problems.  Denies medication side effects.    Followed up with Urologist yesterday for some left back pain.  Had xrays that were negative for stones in the ureter.  Following PSA.  Also have some Urianary leakage and had [place] of bladder that was normal. Also having some urgency. Script sent for vesicare but it was very expensive, over $100.  Will have to check with his insurance.   Severe obesity/ BMI 44 - still working on medical weight loss. Not interested in surgery. He is eating 1500 calories a day adn still using [employer].    IFG- no inc thrist. + inc in urination as above.   Review of Systems     Objective:   Physical Exam  Constitutional: He is oriented to person, place, and time. He appears well-developed and well-nourished.  HENT:  Head: Normocephalic and atraumatic.  Cardiovascular: Normal rate, regular rhythm and normal heart sounds.   Pulmonary/Chest: Effort normal and breath sounds normal.  Neurological: He is alert and oriented to person, place, and time.  Skin: Skin is warm and dry.  Psychiatric: He has a normal mood and affect. His behavior is normal.  Vitals reviewed.         Assessment & Plan:  HTN - well controlled. F/U in 6 months.    Severe obesity/BMI 44 - work on diet and exercise. He was frustrated as his weight had plateued and now has gained some back.    IFG - stable at 5.9.  F/U in 6 months.   OAB - it looks like detrol is covered in his plan.  Discussed pros and cons on medication adn potential S.E.

## 2015-08-27 NOTE — Telephone Encounter (Signed)
No booster required at this time.

## 2015-08-28 LAB — HEPATITIS C ANTIBODY: HCV AB: NEGATIVE

## 2015-09-02 DIAGNOSIS — K219 Gastro-esophageal reflux disease without esophagitis: Secondary | ICD-10-CM | POA: Diagnosis not present

## 2015-09-09 DIAGNOSIS — H6063 Unspecified chronic otitis externa, bilateral: Secondary | ICD-10-CM | POA: Diagnosis not present

## 2015-09-09 DIAGNOSIS — H6123 Impacted cerumen, bilateral: Secondary | ICD-10-CM | POA: Diagnosis not present

## 2015-09-21 DIAGNOSIS — N2889 Other specified disorders of kidney and ureter: Secondary | ICD-10-CM | POA: Diagnosis not present

## 2015-09-21 DIAGNOSIS — I878 Other specified disorders of veins: Secondary | ICD-10-CM | POA: Diagnosis not present

## 2015-09-21 DIAGNOSIS — N200 Calculus of kidney: Secondary | ICD-10-CM | POA: Diagnosis not present

## 2015-09-27 DIAGNOSIS — R200 Anesthesia of skin: Secondary | ICD-10-CM | POA: Diagnosis not present

## 2015-09-30 ENCOUNTER — Ambulatory Visit (INDEPENDENT_AMBULATORY_CARE_PROVIDER_SITE_OTHER): Payer: Medicare Other | Admitting: Family Medicine

## 2015-09-30 ENCOUNTER — Ambulatory Visit (INDEPENDENT_AMBULATORY_CARE_PROVIDER_SITE_OTHER): Payer: Medicare Other

## 2015-09-30 ENCOUNTER — Encounter: Payer: Self-pay | Admitting: Family Medicine

## 2015-09-30 VITALS — BP 133/72 | HR 72 | Temp 97.5°F | Resp 18 | Wt 306.5 lb

## 2015-09-30 DIAGNOSIS — M50321 Other cervical disc degeneration at C4-C5 level: Secondary | ICD-10-CM

## 2015-09-30 DIAGNOSIS — R200 Anesthesia of skin: Secondary | ICD-10-CM

## 2015-09-30 DIAGNOSIS — M47812 Spondylosis without myelopathy or radiculopathy, cervical region: Secondary | ICD-10-CM | POA: Diagnosis not present

## 2015-09-30 DIAGNOSIS — M542 Cervicalgia: Secondary | ICD-10-CM

## 2015-09-30 DIAGNOSIS — R208 Other disturbances of skin sensation: Secondary | ICD-10-CM | POA: Diagnosis not present

## 2015-09-30 NOTE — Progress Notes (Signed)
Subjective:    Patient ID: Tracy Bennett, male    DOB: 1946/11/03, 69 y.o.   MRN: [id number]  HPI C/O of bilat hand numbness x 2.5 weeks. Feels constant. occ feels it in his back or feet.  Hx of cervical fusion of C5-6 about 15 years ago.  Also hx of right carpal tunnel release.  Says the numbness is constant.  This pain started right after vomiting several times while passing a kidney stones.  Tried wearing splints but says it is more uncomfortable with them on. He did go to the urgent care orthopedist here locally. They recommended EMG studies. He decided he didn't feel comfortable with that provider and went to the orthopedic group in [place] that he typically sees. They scheduled him an appointment on Tuesday with Dr. Sherrian Bennett. He normally sees Dr. Louanne Bennett. He also complains of a burning sensation over his upper back between the scapula bilaterally.   Review of Systems   BP 133/72 mmHg  Pulse 72  Temp(Src) 97.5 F (36.4 C) (Oral)  Resp 18  Wt 306 lb 8 oz (139.027 kg)  SpO2 98%    Allergies  Allergen Reactions  . Amoxicillin Swelling    Tongue sores  . Contrast Media [Iodinated Diagnostic Agents] Shortness Of Breath  . Lisinopril Cough    Past Medical History  Diagnosis Date  . Hypertension   . Hyperlipidemia   . Kidney stones     x 4   . GERD (gastroesophageal reflux disease)     Past Surgical History  Procedure Laterality Date  . Left knee surgery      ACL repair   . Umbilical hernia repair    . Carpal tunnel release      Right   . Cervical fusion  2000    C5-6  . Foot tendon surgery  11/14/2011    Dr. Sharol Bennett    Social History   Social History  . Marital Status: Married    Spouse Name: Tracy Bennett   . Number of Children: 1  . Years of Education: N/A   Occupational History  . Retired      [employer]    Social History Main Topics  . Smoking status: Former Smoker    Quit date: 02/09/1980  . Smokeless tobacco: Not on file  . Alcohol Use: Yes  . Drug Use: No   . Sexual Activity: Yes   Other Topics Concern  . Not on file   Social History Narrative   Golf about 2 times per week.  No regular exercise.     Family History  Problem Relation Age of Onset  . COPD Mother   . Hypertension Mother   . Emphysema Mother     Outpatient Encounter Prescriptions as of 09/30/2015  Medication Sig  . finasteride (PROSCAR) 5 MG tablet Take 1 tablet (5 mg total) by mouth daily.  . pantoprazole (PROTONIX) 40 MG tablet   . Probiotic Product (ALIGN PO) Take 1 tablet by mouth daily.   . sildenafil (REVATIO) 20 MG tablet Take 100 mg by mouth.  . simvastatin (ZOCOR) 40 MG tablet Take 1 tablet (40 mg total) by mouth daily at 6 PM.  . solifenacin (VESICARE) 5 MG tablet Take 5 mg by mouth.  . Tamsulosin HCl (FLOMAX) 0.4 MG CAPS Take 0.4 mg by mouth daily.   . valsartan-hydrochlorothiazide (DIOVAN-HCT) 80-12.5 MG tablet TAKE 1 TABLET BY MOUTH DAILY. GENERIC PLEASE  . [DISCONTINUED] esomeprazole (NEXIUM) 40 MG capsule Take 1 capsule (40 mg total)  by mouth daily before breakfast. Brand only   No facility-administered encounter medications on file as of 09/30/2015.          Objective:   Physical Exam  Constitutional: He is oriented to person, place, and time. He appears well-developed and well-nourished.  HENT:  Head: Normocephalic and atraumatic.  Musculoskeletal:  Normal cervical flexion, decreased extension. The he says he has had decreased eccentric ever since his surgery for the fusion. Decreased rotation right and left but it is symmetric. Shoulders with normal range of motion. Positive Tennille sign bilaterally. In fact it was quite painful and caused a shooting sensation into the palms of his hands. Phalen sign was negative. He says it's already tingling so didn't necessarily make it worse. Wrists are not swollen. Normal range of motion. Strength of the5 out of 5 bilaterally in the hands and fingers.  Neurological: He is alert and oriented to person, place,  and time.  Skin: Skin is warm and dry.  Psychiatric: He has a normal mood and affect. His behavior is normal.          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 She says the tinnitus was actually quite painful for him. I am concerned that he is getting a burning sensation near his neck and upper back as this with his prior history of cervical surgery. We'll get a cervical x-ray today is to better evaluate that area make sure there's been no trauma or injury. We'll go ahead and order a nerve conduction test since he is not able to wear splints which is the typical course of treatment for carpal tunnel.. Can continue Aleve 2 tabs twice a day. He does have an appointment on Tuesday with the orthopedist.

## 2015-10-01 ENCOUNTER — Other Ambulatory Visit: Payer: Self-pay | Admitting: *Deleted

## 2015-10-01 DIAGNOSIS — R200 Anesthesia of skin: Secondary | ICD-10-CM

## 2015-10-01 DIAGNOSIS — I493 Ventricular premature depolarization: Secondary | ICD-10-CM

## 2015-10-01 DIAGNOSIS — I4581 Long QT syndrome: Secondary | ICD-10-CM

## 2015-10-04 ENCOUNTER — Telehealth: Payer: Self-pay

## 2015-10-04 NOTE — Telephone Encounter (Signed)
I would see if his ortho can order the MRI since they are going to see him for the nerve conduction as well.

## 2015-10-04 NOTE — Telephone Encounter (Signed)
He stated that he would be ok with having MRI done however he will need to have an open mri. I didn't see an order for the MRI.

## 2015-10-05 ENCOUNTER — Other Ambulatory Visit: Payer: Self-pay | Admitting: Family Medicine

## 2015-10-05 DIAGNOSIS — G5601 Carpal tunnel syndrome, right upper limb: Secondary | ICD-10-CM | POA: Diagnosis not present

## 2015-10-05 DIAGNOSIS — M4310 Spondylolisthesis, site unspecified: Secondary | ICD-10-CM

## 2015-10-05 DIAGNOSIS — R200 Anesthesia of skin: Secondary | ICD-10-CM

## 2015-10-05 DIAGNOSIS — G5602 Carpal tunnel syndrome, left upper limb: Secondary | ICD-10-CM | POA: Diagnosis not present

## 2015-10-05 DIAGNOSIS — R209 Unspecified disturbances of skin sensation: Secondary | ICD-10-CM | POA: Diagnosis not present

## 2015-10-05 DIAGNOSIS — R208 Other disturbances of skin sensation: Secondary | ICD-10-CM | POA: Diagnosis not present

## 2015-10-06 LAB — TSH: TSH: 1.153 u[IU]/mL (ref 0.350–4.500)

## 2015-10-06 LAB — VITAMIN B12: VITAMIN B 12: 355 pg/mL (ref 211–911)

## 2015-10-06 NOTE — Telephone Encounter (Signed)
Left message advising of MRI order.

## 2015-10-08 ENCOUNTER — Other Ambulatory Visit: Payer: Self-pay

## 2015-10-08 ENCOUNTER — Ambulatory Visit (HOSPITAL_COMMUNITY): Payer: Medicare Other | Attending: Internal Medicine

## 2015-10-08 DIAGNOSIS — I517 Cardiomegaly: Secondary | ICD-10-CM | POA: Diagnosis not present

## 2015-10-08 DIAGNOSIS — Z87891 Personal history of nicotine dependence: Secondary | ICD-10-CM | POA: Insufficient documentation

## 2015-10-08 DIAGNOSIS — Z6841 Body Mass Index (BMI) 40.0 and over, adult: Secondary | ICD-10-CM | POA: Insufficient documentation

## 2015-10-08 DIAGNOSIS — I10 Essential (primary) hypertension: Secondary | ICD-10-CM | POA: Diagnosis not present

## 2015-10-08 DIAGNOSIS — I4581 Long QT syndrome: Secondary | ICD-10-CM

## 2015-10-08 DIAGNOSIS — E785 Hyperlipidemia, unspecified: Secondary | ICD-10-CM | POA: Insufficient documentation

## 2015-10-08 DIAGNOSIS — I493 Ventricular premature depolarization: Secondary | ICD-10-CM | POA: Insufficient documentation

## 2015-10-11 ENCOUNTER — Ambulatory Visit (INDEPENDENT_AMBULATORY_CARE_PROVIDER_SITE_OTHER): Payer: Medicare Other

## 2015-10-11 DIAGNOSIS — M4802 Spinal stenosis, cervical region: Secondary | ICD-10-CM

## 2015-10-11 DIAGNOSIS — M4310 Spondylolisthesis, site unspecified: Secondary | ICD-10-CM

## 2015-10-11 DIAGNOSIS — R200 Anesthesia of skin: Secondary | ICD-10-CM

## 2015-10-12 ENCOUNTER — Encounter: Payer: Self-pay | Admitting: Family Medicine

## 2015-10-13 ENCOUNTER — Other Ambulatory Visit: Payer: Self-pay | Admitting: *Deleted

## 2015-10-13 DIAGNOSIS — I493 Ventricular premature depolarization: Secondary | ICD-10-CM

## 2015-10-14 ENCOUNTER — Other Ambulatory Visit: Payer: Self-pay | Admitting: *Deleted

## 2015-10-14 ENCOUNTER — Telehealth: Payer: Self-pay | Admitting: Emergency Medicine

## 2015-10-14 DIAGNOSIS — M4310 Spondylolisthesis, site unspecified: Secondary | ICD-10-CM

## 2015-10-14 DIAGNOSIS — R200 Anesthesia of skin: Secondary | ICD-10-CM

## 2015-10-14 MED ORDER — DIAZEPAM 5 MG PO TABS: 2.5000 mg | ORAL_TABLET | Freq: Three times a day (TID) | ORAL | Status: DC | PRN

## 2015-10-14 MED ORDER — TRAMADOL HCL 50 MG PO TABS: 50.0000 mg | ORAL_TABLET | Freq: Three times a day (TID) | ORAL | Status: DC | PRN

## 2015-10-14 NOTE — Telephone Encounter (Signed)
Call from patient's wife saying patient is miserable with neck pain and numbness in arms and hands; wants a call to explain the MRI (she can view in my chart); patient could not sleep last night and she feels he needs to be seen today, even if in hospital or ED.pak

## 2015-10-14 NOTE — Telephone Encounter (Signed)
Please send copy of MRI report to Dr.Newton at [facility] and to Dr. Domingo Madeira.  May have to specifics on their local from patient as we didn't make those referrals.  Please tell him that we are working on referral to neurosurgery. I have something at [facility] tomorrow.  Does he have anything for pain?

## 2015-10-14 NOTE — Telephone Encounter (Signed)
Pt informed that medication was sent to [facility]Halim Surrette, [place]'

## 2015-10-14 NOTE — Telephone Encounter (Signed)
Called and [facility] regarding results and to see if he needs any pain medication.

## 2015-10-14 NOTE — Telephone Encounter (Signed)
Mri results routed to [place].Tracy Bennett

## 2015-10-15 DIAGNOSIS — R202 Paresthesia of skin: Secondary | ICD-10-CM | POA: Diagnosis not present

## 2015-10-18 DIAGNOSIS — M542 Cervicalgia: Secondary | ICD-10-CM | POA: Diagnosis not present

## 2015-10-18 DIAGNOSIS — M4802 Spinal stenosis, cervical region: Secondary | ICD-10-CM | POA: Diagnosis not present

## 2015-10-19 ENCOUNTER — Encounter: Payer: No Typology Code available for payment source | Admitting: Neurology

## 2015-10-20 DIAGNOSIS — Z01812 Encounter for preprocedural laboratory examination: Secondary | ICD-10-CM | POA: Diagnosis not present

## 2015-10-25 DIAGNOSIS — N200 Calculus of kidney: Secondary | ICD-10-CM | POA: Diagnosis not present

## 2015-10-26 DIAGNOSIS — M5412 Radiculopathy, cervical region: Secondary | ICD-10-CM | POA: Diagnosis not present

## 2015-10-26 DIAGNOSIS — G5602 Carpal tunnel syndrome, left upper limb: Secondary | ICD-10-CM | POA: Diagnosis not present

## 2015-10-26 DIAGNOSIS — M50221 Other cervical disc displacement at C4-C5 level: Secondary | ICD-10-CM | POA: Diagnosis not present

## 2015-10-26 DIAGNOSIS — M4802 Spinal stenosis, cervical region: Secondary | ICD-10-CM | POA: Diagnosis not present

## 2015-10-26 DIAGNOSIS — M810 Age-related osteoporosis without current pathological fracture: Secondary | ICD-10-CM | POA: Diagnosis not present

## 2015-11-08 DIAGNOSIS — M4802 Spinal stenosis, cervical region: Secondary | ICD-10-CM | POA: Diagnosis not present

## 2015-12-13 DIAGNOSIS — M4802 Spinal stenosis, cervical region: Secondary | ICD-10-CM | POA: Diagnosis not present

## 2015-12-13 DIAGNOSIS — Z6841 Body Mass Index (BMI) 40.0 and over, adult: Secondary | ICD-10-CM | POA: Diagnosis not present

## 2015-12-15 ENCOUNTER — Encounter: Payer: Self-pay | Admitting: Physical Therapy

## 2015-12-15 ENCOUNTER — Ambulatory Visit (INDEPENDENT_AMBULATORY_CARE_PROVIDER_SITE_OTHER): Payer: Medicare Other | Admitting: Physical Therapy

## 2015-12-15 DIAGNOSIS — M2560 Stiffness of unspecified joint, not elsewhere classified: Secondary | ICD-10-CM | POA: Diagnosis not present

## 2015-12-15 DIAGNOSIS — R293 Abnormal posture: Secondary | ICD-10-CM

## 2015-12-15 DIAGNOSIS — M79642 Pain in left hand: Secondary | ICD-10-CM | POA: Diagnosis not present

## 2015-12-15 DIAGNOSIS — R531 Weakness: Secondary | ICD-10-CM

## 2015-12-15 NOTE — Therapy (Addendum)
[place], [place], [address] Phone: [phone]   Fax:  [phone]  Physical Therapy Evaluation  Patient Details  Name: Tracy Bennett MRN: [id number] Date of Birth: 1946/02/04 Referring Provider: Dr Veatrice Kells  Encounter Date: 12/15/2015      PT End of Session - 12/15/15 0921    Visit Number 1   PT Start Time [id number]   PT Stop Time 0928   PT Time Calculation (min) 46 min      Past Medical History  Diagnosis Date  . Hypertension   . Hyperlipidemia   . Kidney stones     x 4   . GERD (gastroesophageal reflux disease)     Past Surgical History  Procedure Laterality Date  . Left knee surgery      ACL repair   . Umbilical hernia repair    . Carpal tunnel release      Right   . Cervical fusion  2000    C5-6  . Foot tendon surgery  11/14/2011    Dr. Sharol Given    There were no vitals filed for this visit.  Visit Diagnosis:  Weakness generalized - Plan: PT plan of care cert/re-cert  Pain of left hand - Plan: PT plan of care cert/re-cert  Generalized stiffness - Plan: PT plan of care cert/re-cert  Abnormal posture - Plan: PT plan of care cert/re-cert      Subjective Assessment - 12/15/15 0844    Subjective Pt reports he developed UE numbness and pain in the early fall.  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 He had this on 10/26/15. He had spinal decompression C4-5 with interbody fusion C3-5 and Lt median nerve decompression.    Pertinent History cervical fusion C5-7 2000, Rt carpal tunnel release , ACL repair, HTN.  Patients order was for his neck.  He states he is not concerned about his neck it is his Lt hand that is causing him issues and that is what he wants treated.  A message has been left at the MD office to get clarification and an order for this.     Diagnostic tests  MRI, x-rays   Patient Stated Goals increase strength in arms, perform yard work/use blower, golf, work on his computer   Currently in Pain? No/denies  only some throbbing intermittent in the Lt hand            [facility] PT Assessment - 12/15/15 0001    Assessment   Medical Diagnosis cervical stenosis, with surgery, Lt carpal tunnel release    Referring Provider Dr Veatrice Kells   Onset Date/Surgical Date 10/26/15   Hand Dominance Right   Prior Therapy not for this   Precautions   Precautions --  pt reports he has been released from precautions    Precaution Comments told to be careful and use sense.    Required Braces or Orthoses --  wore neckbrace originally    Balance Screen   Has the patient fallen in the past 6 months No   Has the patient had a decrease in activity level because of a fear of falling?  No   Is the patient reluctant to leave their home because of a fear of falling?  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    Observation/Other Assessments   Focus on Therapeutic Outcomes (FOTO)  55% limited   Posture/Postural Control   Posture/Postural Control Postural limitations   Postural Limitations Rounded Shoulders;Forward head;Increased thoracic kyphosis;Flexed trunk  extra abdominal girth, dowgers hump   Tone   Assessment Location --  tremors bilat hands Lt > Rt   ROM / Strength   AROM / PROM / Strength AROM;Strength   AROM   Overall AROM Comments shoulders/elbows WNL   AROM Assessment Site Cervical;Wrist;Finger   Right/Left Wrist Left   Right Wrist Extension 65 Degrees   Right Wrist Flexion 78 Degrees   Left Wrist Extension 52 Degrees   Left Wrist Flexion 65 Degrees   Cervical  Flexion 45   Cervical Extension 25  tightness   Cervical - Right Rotation 45   Cervical - Left Rotation 55   Strength   Overall Strength Comments bilat shoulders/elbows WNL, Rt wrist 4+/5, Lt grossly 4/   Strength Assessment Site Hand   Right/Left hand Right;Left   Right Hand Grip (lbs) 78   Right Hand Lateral Pinch 15 lbs   Left Hand Grip (lbs) 38   Left Hand Lateral Pinch 10 lbs   Palpation   Palpation comment trigger points in Lt thenar emminence and into the forarm.   tightness in Lt carpal bones with crepitis                   [facility] Adult PT Treatment/Exercise - 12/15/15 0001    Exercises   Exercises Shoulder;Wrist   Shoulder Exercises: Supine   Horizontal ABduction Both;10 reps;Strengthening  red band   External Rotation Strengthening;Both;10 reps  red band   Flexion Strengthening;Both;10 reps  overhead, red band   Other Supine Exercises D1 flexion, 10 reps, red band   Wrist Exercises   Other wrist exercises grip towel roll   Other wrist exercises stretch wrist flex/ext   Modalities   Modalities Cryotherapy   Cryotherapy   Number Minutes Cryotherapy 12 Minutes   Cryotherapy Location Hand;Wrist  Lt   Type of Cryotherapy Ice pack                PT Education - 12/15/15 0904    Education provided Yes   Education Details HEP and initial posture education   Person(s) Educated Patient   Methods Explanation;Demonstration;Handout   Comprehension Returned demonstration;Verbalized understanding             PT Long Term Goals - 12/15/15 1126    PT LONG TERM GOAL #1   Title I wth HEP ( 01/19/16)    Time 4   Period Weeks   Status New   PT LONG TERM GOAL #2   Title increase wrist ext/flexion =/> 75 degrees ( 01/19/16)    Time 4   Period Weeks   Status New   PT LONG TERM GOAL #3   Title tolerate carrying/using his blower and yard equipment without difficulty ( 01/19/16)    Time 4   Period Weeks   Status New   PT LONG TERM GOAL #4   Title  increase strength in Lt wrist =/> 5-/5 ( 01/19/16)    Time 4   Period Weeks   Status New   PT LONG TERM GOAL #5   Title improve Lt grip =/> 50# ( 01/19/16)  Time 4   Period Weeks   Status New   Additional Long Term Goals   Additional Long Term Goals Yes   PT LONG TERM GOAL #6   Title improve FOTO =/< 39% limited,  ( 01/19/16)    Time 4   Period Weeks   Status New               Plan - 12/15/15 1348    Clinical Impression Statement 70 yo male referred to PT s/p c3-5 surgery and Lt carpal tunnel sugery. He has a h/o C5-7 fusion in 2000 also.  He is mainly concerned with the function of his Lt hand/arm.  He had decreased ROM, strength along with pain that limit his function    Pt will benefit from skilled therapeutic intervention in order to improve on the following deficits Postural dysfunction;Decreased strength;Pain;Impaired UE functional use;Decreased range of motion   Rehab Potential Good   PT Frequency 2x / week   PT Duration 4 weeks   PT Treatment/Interventions Ultrasound;Neuromuscular re-education;Patient/family education;Passive range of motion;Dry needling;Electrical Stimulation;Cryotherapy;Moist Heat;Therapeutic exercise;Manual techniques;Vasopneumatic Device   PT Next Visit Plan TDN to Lt hand if patient is interested, postural ex and [place] to wrist./incision   Consulted and Agree with Plan of Care Patient         Problem List Patient Active Problem List   Diagnosis Date Noted  . Obesity, Class III, BMI 40-49.9 (morbid obesity) ([place]) 08/27/2015  . OAB (overactive bladder) 08/27/2015  . Mild diastolic dysfunction [id number]  . LVH (left ventricular hypertrophy) due to hypertensive disease 11/23/2014  . Prolonged Q-T interval on ECG 11/04/2014  . OSA treated with BiPAP 09/01/2014  . IFG (impaired fasting glucose) 09/01/2014  . Severe obesity (BMI >= 40) ([place]) 01/29/2014  . BPH (benign prostatic hyperplasia) 01/28/2013  . Obesity 02/09/2012  . Hypertension  10/12/2011  . GERD (gastroesophageal reflux disease) 10/12/2011  . Hyperlipidemia 10/12/2011    Jeral Pinch PT 12/15/2015, 1:54 PM  [facility] [place], [place], [address] Phone: [phone]   Fax:  [phone]  Name: Tracy Bennett MRN: [id number] Date of Birth: 1946/11/14

## 2015-12-15 NOTE — Patient Instructions (Addendum)
Over Head Pull: Narrow Grip     [place] [phone]   On back, knees bent, feet flat, band across thighs, elbows straight but relaxed. Pull hands apart (start). Keeping elbows straight, bring arms up and over head, hands toward floor. Keep pull steady on band. Hold momentarily. Return slowly, keeping pull steady, back to start. Repeat _1-2x10__ times. Band color ___red___   Side Pull: Double Arm   On back, knees bent, feet flat. Arms perpendicular to body, shoulder level, elbows straight but relaxed. Pull arms out to sides, elbows straight. Resistance band comes across collarbones, hands toward floor. Hold momentarily. Slowly return to starting position. Repeat _1-2x10__ times. Band color _red____   Elmer Picker   On back, knees bent, feet flat, left hand on left hip, right hand above left. Pull right arm DIAGONALLY (hip to shoulder) across chest. Bring right arm along head toward floor. Hold momentarily. Slowly return to starting position. Repeat _1-2x10__ times. Do with left arm. Band color ___red___   Shoulder Rotation: Double Arm   On back, knees bent, feet flat, elbows tucked at sides, bent 90, hands palms up. Pull hands apart and down toward floor, keeping elbows near sides. Hold momentarily. Slowly return to starting position. Repeat _1-2x10__ times. Band color _red_____   Towel Roll Squeeze    With right forearm resting on surface, gently squeeze towel. Repeat __10__ times per set. Do __1__ sets per session. Do __1__ sessions per day.  .  Wrist Flexor Stretch    Keeping elbow straight, grasp left hand and slowly bend wrist back until stretch is felt. Hold _20-30___ seconds. Relax. Repeat __1__ times per set. Do __2__ sets per session. Do _1___ sessions per day.  Wrist Extensor Stretch    Keeping elbow straight, grasp left hand and slowly bend wrist forward until stretch is felt. Hold __20-30__ seconds. Relax. Repeat _1___ times per set. Do _2__ sets per session. Do __1__  sessions per day.  Copyright  VHI. All rights reserved.   TENS UNIT: This is helpful for muscle pain and spasm.   Search and Purchase a TENS 7000 2nd edition at www.tenspros.com. It should be less than $30.     TENS unit instructions: Do not shower or bathe with the unit on Turn the unit off before removing electrodes or batteries If the electrodes lose stickiness add a drop of water to the electrodes after they are disconnected from the unit and place on plastic sheet. If you continued to have difficulty, call the TENS unit company to purchase more electrodes. Do not apply lotion on the skin area prior to use. Make sure the skin is clean and dry as this will help prolong the life of the electrodes. After use, always check skin for unusual red areas, rash or other skin difficulties. If there are any skin problems, does not apply electrodes to the same area. Never remove the electrodes from the unit by pulling the wires. Do not use the TENS unit or electrodes other than as directed. Do not change electrode placement without consultating your therapist or physician. Keep 2 fingers with between each electrode. Wear time ratio is 2:1, on to off times.    For example on for 30 minutes off for 15 minutes and then on for 30 minutes off for 15 minutes  Trigger Point Dry Needling  . What is Trigger Point Dry Needling (DN)? o DN is a physical therapy technique used to treat muscle pain and dysfunction. Specifically, DN helps deactivate muscle trigger points (muscle  knots).  o A thin filiform needle is used to penetrate the skin and stimulate the underlying trigger point. The goal is for a local twitch response (LTR) to occur and for the trigger point to relax. No medication of any kind is injected during the procedure.   . What Does Trigger Point Dry Needling Feel Like?  o The procedure feels different for each individual patient. Some patients report that they do not actually feel the needle  enter the skin and overall the process is not painful. Very mild bleeding may occur. However, many patients feel a deep cramping in the muscle in which the needle was inserted. This is the local twitch response.   Marland Kitchen How Will I feel after the treatment? o Soreness is normal, and the onset of soreness may not occur for a few hours. Typically this soreness does not last longer than two days.  o Bruising is uncommon, however; ice can be used to decrease any possible bruising.  o In rare cases feeling tired or nauseous after the treatment is normal. In addition, your symptoms may get worse before they get better, this period will typically not last longer than 24 hours.   . What Can I do After My Treatment? o Increase your hydration by drinking more water for the next 24 hours. o You may place ice or heat on the areas treated that have become sore, however, do not use heat on inflamed or bruised areas. Heat often brings more relief post needling. o You can continue your regular activities, but vigorous activity is not recommended initially after the treatment for 24 hours. o DN is best combined with other physical therapy such as strengthening, stretching, and other therapies.

## 2015-12-17 ENCOUNTER — Ambulatory Visit (INDEPENDENT_AMBULATORY_CARE_PROVIDER_SITE_OTHER): Payer: Medicare Other | Admitting: Physical Therapy

## 2015-12-17 DIAGNOSIS — M79642 Pain in left hand: Secondary | ICD-10-CM | POA: Diagnosis not present

## 2015-12-17 DIAGNOSIS — R293 Abnormal posture: Secondary | ICD-10-CM

## 2015-12-17 DIAGNOSIS — M2560 Stiffness of unspecified joint, not elsewhere classified: Secondary | ICD-10-CM | POA: Diagnosis not present

## 2015-12-17 DIAGNOSIS — R531 Weakness: Secondary | ICD-10-CM

## 2015-12-17 NOTE — Therapy (Signed)
[place], [place], [address] Phone: [phone]   Fax:  [phone]  Physical Therapy Treatment  Patient Details  Name: Tracy Bennett MRN: [id number] Date of Birth: 1946-08-20 Referring Provider: Dr. Veatrice Kells  Encounter Date: 12/17/2015      PT End of Session - 12/17/15 0931    Visit Number 2   Number of Visits 8   Date for PT Re-Evaluation 01/12/16   PT Start Time 0932   PT Stop Time 1019   PT Time Calculation (min) 47 min   Activity Tolerance Patient tolerated treatment well      Past Medical History  Diagnosis Date  . Hypertension   . Hyperlipidemia   . Kidney stones     x 4   . GERD (gastroesophageal reflux disease)     Past Surgical History  Procedure Laterality Date  . Left knee surgery      ACL repair   . Umbilical hernia repair    . Carpal tunnel release      Right   . Cervical fusion  2000    C5-6  . Foot tendon surgery  11/14/2011    Dr. Sharol Given    There were no vitals filed for this visit.  Visit Diagnosis:  Weakness generalized  Pain of left hand  Generalized stiffness  Abnormal posture      Subjective Assessment - 12/17/15 0940    Subjective Pt reports no changes since last visit. Has completed HEP yesterday. He is concerned with shaking in Lt UE; mentioned he will see Neurologist after completing PT.  "My (Lt) thumb feels like it was pulled out of the socket"    Currently in Pain? Yes   Pain Score 2    Pain Location Neck   Pain Orientation Left;Right   Pain Descriptors / Indicators Aching   Multiple Pain Sites Yes   Pain Score 5   Pain Location Hand  webspace    Pain Orientation Left   Pain Descriptors / Indicators Sore   Aggravating Factors  ?   Pain Relieving Factors ?            [facility] PT Assessment - 12/17/15 0001    Assessment   Medical Diagnosis cervical stenosis, with surgery, Lt carpal tunnel release    Referring Provider Dr.  Veatrice Kells   Onset Date/Surgical Date 10/25/16   Hand Dominance Right   Prior Therapy not for this          [facility] Adult PT Treatment/Exercise - 12/17/15 0001    Shoulder Exercises: Supine   Horizontal ABduction Both;10 reps;Strengthening  red band   External Rotation Strengthening;Both;10 reps  red band   Flexion Strengthening;Both;10 reps  overhead, red band   Other Supine Exercises D1 flexion, 10 reps, red band each side (sash)   Wrist Exercises   Other wrist exercises wrist circles x 20 reps; yellow digitzer x 10 reps fingers 1-4; ball squeeze between thumb and each finger (opposition) x 5 squeezes each (challenging with 4th and 5th finger)    Other wrist exercises stretch wrist flex/ext 30 sec x 2 reps Lt   Modalities   Modalities Ultrasound   Ultrasound   Ultrasound Location Lt thenar eminance and webspace    Ultrasound Parameters 20%, 1.2 w/cm2, 8 min    Ultrasound Goals Pain   Manual Therapy   Manual Therapy Myofascial release;Soft tissue mobilization   Soft tissue mobilization Edge tool assistance to Lt distal to proximal forearm  and thenar eminance to decrease fascial restrictions and pain    Myofascial Release Pin and stretch to wrist flexor/ext with AROM of wrist/hand/ thumb flex/ext           PT Education - 12/17/15 1145    Education provided Yes   Education Details Encouraged pt to work on ball squeeze (opposition) with thumb and each finger.     Person(s) Educated Patient   Methods Explanation;Demonstration   Comprehension Verbalized understanding;Returned demonstration             PT Long Term Goals - 12/17/15 1625    PT LONG TERM GOAL #1   Title I wth HEP ( 01/19/16)    Time 4   Period Weeks   Status On-going   PT LONG TERM GOAL #2   Title increase wrist ext/flexion =/> 75 degrees ( 01/19/16)    Time 4   Period Weeks   Status On-going   PT LONG TERM GOAL #3   Title tolerate carrying/using his blower and yard equipment without difficulty (  01/19/16)    Time 4   Period Weeks   Status On-going   PT LONG TERM GOAL #4   Title increase strength in Lt wrist =/> 5-/5 ( 01/19/16)    Time 4   Period Weeks   Status On-going   PT LONG TERM GOAL #5   Title improve Lt grip =/> 50# ( 01/19/16)    Time 4   Period Weeks   Status On-going   PT LONG TERM GOAL #6   Title improve FOTO =/< 39% limited,  ( 01/19/16)    Time 4   Period Weeks   Status On-going               Plan - 12/17/15 1151    Clinical Impression Statement Pt tolerated exercises well without increase in pain/symptoms.  Pt demonstrated increased tremor with any muscle initiation in LUE.  Pt demonstrated difficulty with opposition movement with Lt thumb and 4th-5th finger.  Pt reported slight decrease in discomfort at end of session.     Pt will benefit from skilled therapeutic intervention in order to improve on the following deficits Postural dysfunction;Decreased strength;Pain;Impaired UE functional use;Decreased range of motion   Rehab Potential Good   PT Frequency 2x / week   PT Duration 4 weeks   PT Treatment/Interventions Ultrasound;Neuromuscular re-education;Patient/family education;Passive range of motion;Dry needling;Electrical Stimulation;Cryotherapy;Moist Heat;Therapeutic exercise;Manual techniques;Vasopneumatic Device   PT Next Visit Plan TDN to Lt hand if patient is interested, postural ex and [place] to wrist./incision   Consulted and Agree with Plan of Care Patient        Problem List Patient Active Problem List   Diagnosis Date Noted  . Obesity, Class III, BMI 40-49.9 (morbid obesity) ([place]) 08/27/2015  . OAB (overactive bladder) 08/27/2015  . Mild diastolic dysfunction [id number]  . LVH (left ventricular hypertrophy) due to hypertensive disease 11/23/2014  . Prolonged Q-T interval on ECG 11/04/2014  . OSA treated with BiPAP 09/01/2014  . IFG (impaired fasting glucose) 09/01/2014  . Severe obesity (BMI >= 40) ([place]) 01/29/2014  . BPH (benign  prostatic hyperplasia) 01/28/2013  . Obesity 02/09/2012  . Hypertension 10/12/2011  . GERD (gastroesophageal reflux disease) 10/12/2011  . Hyperlipidemia 10/12/2011    Kerin Perna, PTA 12/17/2015 4:26 PM  [place], [place], [address] Phone: [phone]   Fax:  [phone]  Name: Cruze Amaya MRN: [id number] Date of Birth: [birth year]

## 2015-12-21 ENCOUNTER — Ambulatory Visit (INDEPENDENT_AMBULATORY_CARE_PROVIDER_SITE_OTHER): Payer: Medicare Other | Admitting: Physical Therapy

## 2015-12-21 DIAGNOSIS — M2560 Stiffness of unspecified joint, not elsewhere classified: Secondary | ICD-10-CM | POA: Diagnosis not present

## 2015-12-21 DIAGNOSIS — R531 Weakness: Secondary | ICD-10-CM

## 2015-12-21 DIAGNOSIS — M79642 Pain in left hand: Secondary | ICD-10-CM | POA: Diagnosis not present

## 2015-12-21 NOTE — Therapy (Signed)
[place], [place], [address] Phone: [phone]   Fax:  [phone]  Physical Therapy Treatment  Patient Details  Name: Tracy Bennett MRN: [id number] Date of Birth: 01-11-1946 Referring Provider: Dr. Veatrice Kells  Encounter Date: 12/21/2015      PT End of Session - 12/21/15 0928    Visit Number 3   Number of Visits 8   Date for PT Re-Evaluation 01/12/16   PT Start Time 0928   PT Stop Time 1023   PT Time Calculation (min) 55 min   Activity Tolerance Patient tolerated treatment well      Past Medical History  Diagnosis Date  . Hypertension   . Hyperlipidemia   . Kidney stones     x 4   . GERD (gastroesophageal reflux disease)     Past Surgical History  Procedure Laterality Date  . Left knee surgery      ACL repair   . Umbilical hernia repair    . Carpal tunnel release      Right   . Cervical fusion  2000    C5-6  . Foot tendon surgery  11/14/2011    Dr. Sharol Given    There were no vitals filed for this visit.  Visit Diagnosis:  Weakness generalized  Pain of left hand  Generalized stiffness      Subjective Assessment - 12/21/15 0929    Subjective Feels like the hand is getting a little better.    Currently in Pain? Yes   Pain Score 2    Pain Location Hand  thumb   Pain Orientation Left   Pain Descriptors / Indicators Sore  sensitive   Pain Type Surgical pain   Pain Onset More than a month ago   Aggravating Factors  use of the hand   Pain Relieving Factors ice, TENS                         [facility] Adult PT Treatment/Exercise - 12/21/15 0001    Shoulder Exercises: ROM/Strengthening   UBE (Upper Arm Bike) L2 x 4' alt FWD/BWD   Wrist Exercises   Other wrist exercises finger ext/grip with red ball/med firmness   Other wrist exercises prayer stretch 3x20 sec, wrist flex/ext stretches   Modalities   Modalities Ultrasound   Ultrasound   Ultrasound  Location Lt thenar emmince/forearm extensor mass, incision Lt wrist   Ultrasound Parameters 100%/3.19mz, 1.5w/cm2, 100%, 1.075m, 1.5w/cm2, 20% 3.21m28m 0.50 w/cm2 respectivieyl   Ultrasound Goals Pain   Manual Therapy   Soft tissue mobilization Lt forearm, hand           Trigger Point Dry Needling - 12/21/15 1012    Consent Given? Yes   Education Handout Provided Yes   Muscles Treated Upper Body --  Lt oppenens & adductor pollicis, forearm extensor musclucatu                   PT Long Term Goals - 12/21/15 1019    PT LONG TERM GOAL #1   Title I wth HEP ( 01/19/16)    Status On-going   PT LONG TERM GOAL #2   Title increase wrist ext/flexion =/> 75 degrees ( 01/19/16)    Status On-going   PT LONG TERM GOAL #3   Title tolerate carrying/using his blower and yard equipment without difficulty ( 01/19/16)    Status On-going   PT LONG TERM GOAL #4   Title increase  strength in Lt wrist =/> 5-/5 ( 01/19/16)    Status On-going   PT LONG TERM GOAL #5   Title improve Lt grip =/> 50# ( 01/19/16)    Status On-going   PT LONG TERM GOAL #6   Title improve FOTO =/< 39% limited,  ( 01/19/16)    Status On-going               Plan - 12/21/15 1016    Clinical Impression Statement Pt had good twitch responses to TDN, with palpable softening of the muscles. He did have some tenderness afterwards. Not new goals met, only thrid visit.    Pt will benefit from skilled therapeutic intervention in order to improve on the following deficits Postural dysfunction;Decreased strength;Pain;Impaired UE functional use;Decreased range of motion   Rehab Potential Good   PT Frequency 2x / week   PT Duration 4 weeks   PT Treatment/Interventions Ultrasound;Neuromuscular re-education;Patient/family education;Passive range of motion;Dry needling;Electrical Stimulation;Cryotherapy;Moist Heat;Therapeutic exercise;Manual techniques;Vasopneumatic Device   PT Next Visit Plan assess response to TDN    Consulted and Agree with Plan of Care Patient        Problem List Patient Active Problem List   Diagnosis Date Noted  . Obesity, Class III, BMI 40-49.9 (morbid obesity) ([place]) 08/27/2015  . OAB (overactive bladder) 08/27/2015  . Mild diastolic dysfunction 20/72/1828  . LVH (left ventricular hypertrophy) due to hypertensive disease 11/23/2014  . Prolonged Q-T interval on ECG 11/04/2014  . OSA treated with BiPAP 09/01/2014  . IFG (impaired fasting glucose) 09/01/2014  . Severe obesity (BMI >= 40) ([place]) 01/29/2014  . BPH (benign prostatic hyperplasia) 01/28/2013  . Obesity 02/09/2012  . Hypertension 10/12/2011  . GERD (gastroesophageal reflux disease) 10/12/2011  . Hyperlipidemia 10/12/2011    Jeral Pinch PT 12/21/2015, 10:21 AM  [facility] [place], [place], [address] Phone: [phone]   Fax:  [phone]  Name: Tracy Bennett MRN: [id number] Date of Birth: 1946/10/20

## 2015-12-21 NOTE — Patient Instructions (Signed)

## 2015-12-24 ENCOUNTER — Ambulatory Visit (INDEPENDENT_AMBULATORY_CARE_PROVIDER_SITE_OTHER): Payer: Medicare Other | Admitting: Physical Therapy

## 2015-12-24 DIAGNOSIS — M2560 Stiffness of unspecified joint, not elsewhere classified: Secondary | ICD-10-CM

## 2015-12-24 DIAGNOSIS — R293 Abnormal posture: Secondary | ICD-10-CM

## 2015-12-24 DIAGNOSIS — M79642 Pain in left hand: Secondary | ICD-10-CM | POA: Diagnosis not present

## 2015-12-24 DIAGNOSIS — R531 Weakness: Secondary | ICD-10-CM | POA: Diagnosis not present

## 2015-12-24 NOTE — Therapy (Signed)
[place], [place], [address] Phone: [phone]   Fax:  [phone]  Physical Therapy Treatment  Patient Details  Name: Tracy Bennett MRN: [id number] Date of Birth: 01/13/1946 Referring Provider: Dr. Veatrice Kells  Encounter Date: 12/24/2015      PT End of Session - 12/24/15 0934    Visit Number 4   Number of Visits 8   Date for PT Re-Evaluation 01/12/16   PT Start Time 0933   PT Stop Time 1015   PT Time Calculation (min) 42 min      Past Medical History  Diagnosis Date  . Hypertension   . Hyperlipidemia   . Kidney stones     x 4   . GERD (gastroesophageal reflux disease)     Past Surgical History  Procedure Laterality Date  . Left knee surgery      ACL repair   . Umbilical hernia repair    . Carpal tunnel release      Right   . Cervical fusion  2000    C5-6  . Foot tendon surgery  11/14/2011    Dr. Sharol Given    There were no vitals filed for this visit.  Visit Diagnosis:  Weakness generalized  Pain of left hand  Generalized stiffness  Abnormal posture      Subjective Assessment - 12/24/15 0934    Subjective Pt reports his Lt hand feels a little looser.  It was very sore (hypothenar emminance) for days.  States his Lt arm felt fatigued. Pt continues to be concerned about tremor in LUE. Anxious to return to leisure activities and yard work.    Currently in Pain? Yes   Pain Score 3    Pain Location Hand   Pain Orientation Left   Pain Descriptors / Indicators Sore   Aggravating Factors  use of hand    Pain Relieving Factors ice/ heat             [facility] PT Assessment - 12/24/15 0001    Assessment   Medical Diagnosis cervical stenosis, with surgery, Lt carpal tunnel release    Referring Provider Dr. Veatrice Kells   Onset Date/Surgical Date 10/25/16   Hand Dominance Right   Strength   Strength Assessment Site Hand   Right/Left hand Right;Left   Right Hand Grip  (lbs) 98   Left Hand Grip (lbs) 65           [facility] Adult PT Treatment/Exercise - 12/24/15 0001    Shoulder Exercises: ROM/Strengthening   UBE (Upper Arm Bike) L2 x 4' alt FWD/BWD   Wrist Exercises   Other wrist exercises finger ext/grip with red ball/med firmness x 15 each   yellow digitizer  with Lt hand x 12 reps    Other wrist exercises prayer stretch 3x20 sec, wrist flex/ext stretches   Modalities   Modalities Ultrasound   Ultrasound   Ultrasound Location Lt thenar/hypothenar emminance and web space    Ultrasound Parameters 100% 3.3 mHz, 9 min -0.8-1.1 w/cm2   Ultrasound Goals Pain   Manual Therapy   Manual Therapy Myofascial release;Soft tissue mobilization;Joint mobilization   Joint Mobilization AP mob to Lt f5th finger at MP, DIP, PIP joint.    Soft tissue mobilization to Lt wrist flexor., webspace, thumb musculature.    Myofascial Release to Lt thenar/ hypothenar emminance.                 PT Education - 12/24/15 1236  Education provided Yes   Education Details verbally reviewed current HEP, gave VC for correction of form for band exercises.     Person(s) Educated Patient   Methods Handout;Demonstration;Explanation   Comprehension Verbalized understanding;Returned demonstration             PT Long Term Goals - 12/24/15 1248    PT LONG TERM GOAL #1   Title I wth HEP ( 01/19/16)    Time 4   Period Weeks   Status On-going   PT LONG TERM GOAL #2   Title increase wrist ext/flexion =/> 75 degrees ( 01/19/16)    Time 4   Period Weeks   Status On-going   PT LONG TERM GOAL #3   Title tolerate carrying/using his blower and yard equipment without difficulty ( 01/19/16)    Time 4   Period Weeks   Status On-going   PT LONG TERM GOAL #4   Title increase strength in Lt wrist =/> 5-/5 ( 01/19/16)    Time 4   Period Weeks   Status On-going   PT LONG TERM GOAL #5   Title improve Lt grip =/> 50# ( 01/19/16)    Time 4   Status Achieved   PT LONG TERM GOAL  #6   Title improve FOTO =/< 39% limited,  ( 01/19/16)    Time 4   Period Weeks   Status On-going               Plan - 12/24/15 1237    Clinical Impression Statement Pt had positive response from TDN.  Pt demonstrated improved grip strength bilaterally.  Pt continues with tightness in Lt hypothenar and thenar emminance. Pt reported some decrease in discomfort at end of session.  Gradually progressing towards goals- has met LTG #5.    Pt will benefit from skilled therapeutic intervention in order to improve on the following deficits Postural dysfunction;Decreased strength;Pain;Impaired UE functional use;Decreased range of motion   Rehab Potential Good   PT Frequency 2x / week   PT Duration 4 weeks   PT Treatment/Interventions Ultrasound;Neuromuscular re-education;Patient/family education;Passive range of motion;Dry needling;Electrical Stimulation;Cryotherapy;Moist Heat;Therapeutic exercise;Manual techniques;Vasopneumatic Device   PT Next Visit Plan Continue postural and hand strengthening.  Add doorway stretch to HEP if tolerated.  Continue manual therapy and [place] to Lt hand.    Consulted and Agree with Plan of Care Patient        Problem List Patient Active Problem List   Diagnosis Date Noted  . Obesity, Class III, BMI 40-49.9 (morbid obesity) ([place]) 08/27/2015  . OAB (overactive bladder) 08/27/2015  . Mild diastolic dysfunction 09/81/1914  . LVH (left ventricular hypertrophy) due to hypertensive disease 11/23/2014  . Prolonged Q-T interval on ECG 11/04/2014  . OSA treated with BiPAP 09/01/2014  . IFG (impaired fasting glucose) 09/01/2014  . Severe obesity (BMI >= 40) ([place]) 01/29/2014  . BPH (benign prostatic hyperplasia) 01/28/2013  . Obesity 02/09/2012  . Hypertension 10/12/2011  . GERD (gastroesophageal reflux disease) 10/12/2011  . Hyperlipidemia 10/12/2011    Kerin Perna, PTA 12/24/2015 12:50 PM' [place], [place], [address] Phone: [phone]   Fax:  [phone]  Name: Nikholas Geffre MRN: [id number] Date of Birth: 1946/05/19

## 2015-12-28 ENCOUNTER — Ambulatory Visit (INDEPENDENT_AMBULATORY_CARE_PROVIDER_SITE_OTHER): Payer: Medicare Other | Admitting: Physical Therapy

## 2015-12-28 ENCOUNTER — Encounter: Payer: Self-pay | Admitting: Physical Therapy

## 2015-12-28 DIAGNOSIS — M79642 Pain in left hand: Secondary | ICD-10-CM | POA: Diagnosis not present

## 2015-12-28 DIAGNOSIS — M2560 Stiffness of unspecified joint, not elsewhere classified: Secondary | ICD-10-CM

## 2015-12-28 DIAGNOSIS — R531 Weakness: Secondary | ICD-10-CM

## 2015-12-28 DIAGNOSIS — R293 Abnormal posture: Secondary | ICD-10-CM | POA: Diagnosis not present

## 2015-12-28 NOTE — Therapy (Signed)
[place], [place], [address] Phone: [phone]   Fax:  [phone]  Physical Therapy Treatment  Patient Details  Name: Tracy Bennett MRN: [id number] Date of Birth: 1946-05-06 Referring Provider: Dr Veatrice Kells  Encounter Date: 12/28/2015      PT End of Session - 12/28/15 0934    Visit Number 5   Number of Visits 8   Date for PT Re-Evaluation 01/12/16   PT Start Time 0934   PT Stop Time 1018   PT Time Calculation (min) 44 min   Activity Tolerance Patient tolerated treatment well      Past Medical History  Diagnosis Date  . Hypertension   . Hyperlipidemia   . Kidney stones     x 4   . GERD (gastroesophageal reflux disease)     Past Surgical History  Procedure Laterality Date  . Left knee surgery      ACL repair   . Umbilical hernia repair    . Carpal tunnel release      Right   . Cervical fusion  2000    C5-6  . Foot tendon surgery  11/14/2011    Dr. Sharol Given    There were no vitals filed for this visit.  Visit Diagnosis:  Weakness generalized  Pain of left hand  Generalized stiffness  Abnormal posture      Subjective Assessment - 12/28/15 0936    Subjective Pt feels like his hand has no pain today, some soreness in his neck when he rotates and extends . He ordered a via to work his hands. Hasn't tried to use his back pack hand blower, only used the hand one.    Patient Stated Goals increase strength in arms, perform yard work/use blower, golf, work on his computer   Currently in Pain? No/denies            [facility] PT Assessment - 12/28/15 0001    Assessment   Medical Diagnosis cervical stenosis, with surgery, Lt carpal tunnel release    Referring Provider Dr Veatrice Kells   Onset Date/Surgical Date 10/25/16   Hand Dominance Right   ROM / Strength   AROM / PROM / Strength AROM   AROM   Left Wrist Extension 67 Degrees   Left Wrist Flexion 66 Degrees                      [facility] Adult PT Treatment/Exercise - 12/28/15 0001    Elbow Exercises   Wrist Extension Limitations velcro board to work wrist/forearm/hand   Shoulder Exercises: Standing   Retraction Strengthening;Both;10 reps;Theraband  2 sets, elbows straight and bent   Theraband Level (Shoulder Retraction) Level 3 (Green)   Shoulder Exercises: ROM/Strengthening   UBE (Upper Arm Bike) L3 x 4' alt FWD/BWD   Wrist Exercises   Bar Weights/Barbell (Forearm Supination) 3 lbs  2x10 wrist flex/ext   Other wrist exercises finger waves, wrist flexion   Modalities   Modalities Ultrasound   Ultrasound   Ultrasound Location Lt thenar emminence /web space  and base of 5th metacarpal   Ultrasound Parameters 100%, 3.23mHz, 1.5w/cm2   Ultrasound Goals --  tightness                     PT Long Term Goals - 12/28/15 0939    PT LONG TERM GOAL #1   Title I wth HEP ( 01/19/16)    Status On-going   PT LONG TERM  GOAL #2   Title increase wrist ext/flexion =/> 75 degrees ( 01/19/16)    Status On-going   PT LONG TERM GOAL #3   Title tolerate carrying/using his blower and yard equipment without difficulty ( 01/19/16)    Status On-going   PT LONG TERM GOAL #4   Title increase strength in Lt wrist =/> 5-/5 ( 01/19/16)    Status On-going   PT LONG TERM GOAL #5   Title improve Lt grip =/> 50# ( 01/19/16)    Status Achieved   PT LONG TERM GOAL #6   Title improve FOTO =/< 39% limited,  ( 01/19/16)    Status On-going               Plan - 12/28/15 1102    Clinical Impression Statement Pt is toleraing increased exercise for his Lt hand, He is still very concerned regarding his tremors. Overall pain is reducing.     Pt will benefit from skilled therapeutic intervention in order to improve on the following deficits Postural dysfunction;Decreased strength;Pain;Impaired UE functional use;Decreased range of motion   Rehab Potential Good   PT Frequency 2x / week   PT Duration  4 weeks   PT Treatment/Interventions Ultrasound;Neuromuscular re-education;Patient/family education;Passive range of motion;Dry needling;Electrical Stimulation;Cryotherapy;Moist Heat;Therapeutic exercise;Manual techniques;Vasopneumatic Device   PT Next Visit Plan Continue postural and hand strengthening.  Add doorway stretch to HEP if tolerated.  Continue manual therapy and [place] to Lt hand.    Consulted and Agree with Plan of Care Patient        Problem List Patient Active Problem List   Diagnosis Date Noted  . Obesity, Class III, BMI 40-49.9 (morbid obesity) ([place]) 08/27/2015  . OAB (overactive bladder) 08/27/2015  . Mild diastolic dysfunction [id number]  . LVH (left ventricular hypertrophy) due to hypertensive disease 11/23/2014  . Prolonged Q-T interval on ECG 11/04/2014  . OSA treated with BiPAP 09/01/2014  . IFG (impaired fasting glucose) 09/01/2014  . Severe obesity (BMI >= 40) ([place]) 01/29/2014  . BPH (benign prostatic hyperplasia) 01/28/2013  . Obesity 02/09/2012  . Hypertension 10/12/2011  . GERD (gastroesophageal reflux disease) 10/12/2011  . Hyperlipidemia 10/12/2011    Jeral Pinch PT 12/28/2015, 11:03 AM  [facility] [place], [place], [address] Phone: [phone]   Fax:  [phone]  Name: Tracy Bennett MRN: [id number] Date of Birth: 10/22/1946

## 2015-12-30 ENCOUNTER — Encounter: Payer: No Typology Code available for payment source | Admitting: Physical Therapy

## 2015-12-31 ENCOUNTER — Ambulatory Visit (INDEPENDENT_AMBULATORY_CARE_PROVIDER_SITE_OTHER): Payer: Medicare Other | Admitting: Physical Therapy

## 2015-12-31 DIAGNOSIS — R531 Weakness: Secondary | ICD-10-CM | POA: Diagnosis not present

## 2015-12-31 DIAGNOSIS — M79642 Pain in left hand: Secondary | ICD-10-CM | POA: Diagnosis not present

## 2015-12-31 DIAGNOSIS — M2560 Stiffness of unspecified joint, not elsewhere classified: Secondary | ICD-10-CM | POA: Diagnosis not present

## 2015-12-31 DIAGNOSIS — R293 Abnormal posture: Secondary | ICD-10-CM

## 2015-12-31 NOTE — Therapy (Signed)
[place], [place], [address] Phone: [phone]   Fax:  [phone]  Physical Therapy Treatment  Patient Details  Name: Tracy Bennett MRN: [id number] Date of Birth: November 14, 1946 Referring Provider: Dr Veatrice Kells  Encounter Date: 12/31/2015      PT End of Session - 12/31/15 0934    Visit Number 6   Number of Visits 8   Date for PT Re-Evaluation 01/12/16   PT Start Time 0850   PT Stop Time 0933   PT Time Calculation (min) 43 min      Past Medical History  Diagnosis Date  . Hypertension   . Hyperlipidemia   . Kidney stones     x 4   . GERD (gastroesophageal reflux disease)     Past Surgical History  Procedure Laterality Date  . Left knee surgery      ACL repair   . Umbilical hernia repair    . Carpal tunnel release      Right   . Cervical fusion  2000    C5-6  . Foot tendon surgery  11/14/2011    Dr. Sharol Given    There were no vitals filed for this visit.  Visit Diagnosis:  Weakness generalized  Pain of left hand  Generalized stiffness  Abnormal posture      Subjective Assessment - 12/31/15 0851    Subjective Pt reports his Lt hand is loosening up.  "I'm still hesitant to do certain things (like lifting) due to weakness in hand".  Pt reports soreness in neck (2/10) due to position he slept in, possibly use of leaf blower in Rt hand.   Currently in Pain? Yes   Pain Score 2    Pain Location Hand   Pain Orientation Left   Pain Descriptors / Indicators Dull   Aggravating Factors  lifting   Pain Relieving Factors massage           [facility] Adult PT Treatment/Exercise - 12/31/15 0001    Elbow Exercises   Wrist Extension Limitations velcro board with Lt wrist flexion/ext, key grip supination/pronation   Shoulder Exercises: ROM/Strengthening   UBE (Upper Arm Bike) L3 x 4' alt FWD/BWD   Shoulder Exercises: Stretch   Other Shoulder Stretches doorway stretch , low and mid-level  arms x 20 sec each; high level with unilateral arm x 20 sec    Wrist Exercises   Other wrist exercises wrist flexion and ext with green band x 10 reps x 2 sets; radial deviation x 10 reps   Other wrist exercises stretch wrist flex/ext 30 sec x 2 reps Lt  2 sets.  Lt thumb stretch.    Modalities   Modalities Ultrasound   Ultrasound   Ultrasound Location Lt thenar and hypothenar emminance, web space    Ultrasound Parameters 100%, 3.3 mHz, 1..5 w/cm21   Ultrasound Goals Pain  tightness   Manual Therapy   Manual Therapy Myofascial release   Myofascial Release suboccipital release, MFR to bilat upper trap and cervical paraspinals.            PT Education - 12/31/15 [phone]    Education provided Yes   Education Details HEP: added wrist strengthening with band    Person(s) Educated Patient   Methods Explanation;Handout   Comprehension Returned demonstration;Verbalized understanding             PT Long Term Goals - 12/28/15 0939    PT LONG TERM GOAL #1   Title I wth  HEP ( 01/19/16)    Status On-going   PT LONG TERM GOAL #2   Title increase wrist ext/flexion =/> 75 degrees ( 01/19/16)    Status On-going   PT LONG TERM GOAL #3   Title tolerate carrying/using his blower and yard equipment without difficulty ( 01/19/16)    Status On-going   PT LONG TERM GOAL #4   Title increase strength in Lt wrist =/> 5-/5 ( 01/19/16)    Status On-going   PT LONG TERM GOAL #5   Title improve Lt grip =/> 50# ( 01/19/16)    Status Achieved   PT LONG TERM GOAL #6   Title improve FOTO =/< 39% limited,  ( 01/19/16)    Status On-going               Plan - 12/31/15 1234    Clinical Impression Statement Pt tolerated new band wrist strengthening exercises without increase in pain.  Pt had sensitivity with ultrasound to Lt thumb near anatomical snuff box.  Pt reporting overall decrease in pain and is progressing well towards established goals.    Pt will benefit from skilled therapeutic  intervention in order to improve on the following deficits Postural dysfunction;Decreased strength;Pain;Impaired UE functional use;Decreased range of motion   Rehab Potential Good   PT Frequency 2x / week   PT Duration 4 weeks   PT Treatment/Interventions Ultrasound;Neuromuscular re-education;Patient/family education;Passive range of motion;Dry needling;Electrical Stimulation;Cryotherapy;Moist Heat;Therapeutic exercise;Manual techniques;Vasopneumatic Device   PT Next Visit Plan Hold [place] to Lt anatomical snuffbox and perform manual therapy instead. Continue progressive wrist and postural strengthening.  Continue manual therapy to neck/Lt hand.    Consulted and Agree with Plan of Care Patient        Problem List Patient Active Problem List   Diagnosis Date Noted  . Obesity, Class III, BMI 40-49.9 (morbid obesity) ([place]) 08/27/2015  . OAB (overactive bladder) 08/27/2015  . Mild diastolic dysfunction [id number]  . LVH (left ventricular hypertrophy) due to hypertensive disease 11/23/2014  . Prolonged Q-T interval on ECG 11/04/2014  . OSA treated with BiPAP 09/01/2014  . IFG (impaired fasting glucose) 09/01/2014  . Severe obesity (BMI >= 40) ([place]) 01/29/2014  . BPH (benign prostatic hyperplasia) 01/28/2013  . Obesity 02/09/2012  . Hypertension 10/12/2011  . GERD (gastroesophageal reflux disease) 10/12/2011  . Hyperlipidemia 10/12/2011    Kerin Perna, PTA 12/31/2015 12:38 PM  [place], [place], [address] Phone: [phone]   Fax:  [phone]  Name: Tracy Bennett MRN: [id number] Date of Birth: [birth year]

## 2015-12-31 NOTE — Patient Instructions (Signed)
Radial Deviation: Resisted    With tubing wrapped around left fist and other end secured under foot, bend wrist up (thumb side up) as far as possible. Keep forearm on thigh. Repeat __10__ times per set. Do _2___ sets per session. Do __1__ sessions per day.  Wrist Flexion: Resisted    With tubing wrapped around left fist and other end secured under foot, bend wrist up (palm up) as far as possible. Keep forearm on thigh. Repeat _10___ times per set. Do __1-2__ sets per session. Do __1__ sessions per day.   Wrist Extension: Resisted    With tubing wrapped around left fist and other end secured under foot, bend wrist up (palm down) as far as possible. Keep forearm on thigh. Repeat _10___ times per set. Do __1-2__ sets per session. Do _1___ sessions per day.   [facility] [place], [place] [address]  [phone] (office) [phone] (fax)

## 2016-01-04 ENCOUNTER — Ambulatory Visit (INDEPENDENT_AMBULATORY_CARE_PROVIDER_SITE_OTHER): Payer: Medicare Other | Admitting: Physical Therapy

## 2016-01-04 DIAGNOSIS — M79642 Pain in left hand: Secondary | ICD-10-CM | POA: Diagnosis not present

## 2016-01-04 DIAGNOSIS — R293 Abnormal posture: Secondary | ICD-10-CM

## 2016-01-04 DIAGNOSIS — M2560 Stiffness of unspecified joint, not elsewhere classified: Secondary | ICD-10-CM | POA: Diagnosis not present

## 2016-01-04 DIAGNOSIS — R531 Weakness: Secondary | ICD-10-CM | POA: Diagnosis not present

## 2016-01-04 NOTE — Therapy (Signed)
[place], [place], [address] Phone: [phone]   Fax:  [phone]  Physical Therapy Treatment  Patient Details  Name: Tracy Bennett MRN: [id number] Date of Birth: [birth year] Referring Provider: Dr Veatrice Kells  Encounter Date: 01/04/2016      PT End of Session - 01/04/16 0937    Visit Number 7   Number of Visits 8   Date for PT Re-Evaluation 01/12/16   PT Start Time 0933   PT Stop Time 1015   PT Time Calculation (min) 42 min   Activity Tolerance Patient tolerated treatment well;No increased pain      Past Medical History  Diagnosis Date  . Hypertension   . Hyperlipidemia   . Kidney stones     x 4   . GERD (gastroesophageal reflux disease)     Past Surgical History  Procedure Laterality Date  . Left knee surgery      ACL repair   . Umbilical hernia repair    . Carpal tunnel release      Right   . Cervical fusion  2000    C5-6  . Foot tendon surgery  11/14/2011    Dr. Sharol Given    There were no vitals filed for this visit.  Visit Diagnosis:  Weakness generalized  Pain of left hand  Generalized stiffness  Abnormal posture      Subjective Assessment - 01/04/16 0939    Subjective Pt reports he continues to have "hard, stiff spot" in the Lt wrist, incisional area.  Bought a prohand grip master, medium strength and feels it is improving his grip .  Neck is stiff but not painful.    Currently in Pain? No/denies            [facility] PT Assessment - 01/04/16 0001    Assessment   Medical Diagnosis cervical stenosis, with surgery, Lt carpal tunnel release    Onset Date/Surgical Date 10/25/16   Hand Dominance Right   Next MD Visit 01/20/16   AROM   Right Wrist Extension 72 Degrees   Left Wrist Extension 71 Degrees   Left Wrist Flexion 75 Degrees   Strength   Right/Left hand Right;Left   Right Hand Grip (lbs) 105   Right Hand Lateral Pinch 11 lbs   Left Hand Grip (lbs) 80   Left Hand Lateral Pinch 11 lbs                     [facility] Adult PT Treatment/Exercise - 01/04/16 0001    Elbow Exercises   Wrist Extension Limitations Velcro board with pinch grip (each finger individually with thumb); roller brush with wrist flex/ ext   Shoulder Exercises: ROM/Strengthening   UBE (Upper Arm Bike) L3 x 4' alt FWD/BWD   Wrist Exercises   Bar Weights/Barbell (Forearm Supination) 3 lbs;4 lbs  x 10 reps Lt, 2nd set with 4#   Bar Weights/Barbell (Forearm Pronation) 3 lbs;4 lbs  x 10 reps Lt, 2nd set 4#   Other wrist exercises wrist flex and ext with 4# x 10 reps    Other wrist exercises stretch wrist flex/ext 30 sec x 2 reps Lt  2 sets.  Lt thumb stretch.    Modalities   Modalities Ultrasound   Ultrasound   Ultrasound Location Lt wrist    Ultrasound Parameters 100%, 3.3 mHz, 1.3 w/cm2    Ultrasound Goals --  tightness  PT Long Term Goals - 12/28/15 0939    PT LONG TERM GOAL #1   Title I wth HEP ( 01/19/16)    Status On-going   PT LONG TERM GOAL #2   Title increase wrist ext/flexion =/> 75 degrees ( 01/19/16)    Status On-going   PT LONG TERM GOAL #3   Title tolerate carrying/using his blower and yard equipment without difficulty ( 01/19/16)    Status On-going   PT LONG TERM GOAL #4   Title increase strength in Lt wrist =/> 5-/5 ( 01/19/16)    Status On-going   PT LONG TERM GOAL #5   Title improve Lt grip =/> 50# ( 01/19/16)    Status Achieved   PT LONG TERM GOAL #6   Title improve FOTO =/< 39% limited,  ( 01/19/16)    Status On-going               Plan - 01/04/16 [id number]    Clinical Impression Statement Pt demonstrated improved grip strength bilaterally, as well as improved wrist ROM on Lt.  Pt tolerated increased resistance with exercise without pain/ symptoms.  Progressing well towards established goals.    Pt will benefit from skilled therapeutic intervention in order to improve on the following deficits  Postural dysfunction;Decreased strength;Pain;Impaired UE functional use;Decreased range of motion   Rehab Potential Good   PT Frequency 2x / week   PT Duration 4 weeks   PT Treatment/Interventions Ultrasound;Neuromuscular re-education;Patient/family education;Passive range of motion;Dry needling;Electrical Stimulation;Cryotherapy;Moist Heat;Therapeutic exercise;Manual techniques;Vasopneumatic Device   PT Next Visit Plan  Continue progressive wrist and postural strengthening.  Progress neck/shoulder HEP as tolerated.  End of POC, assess goals. Renewal vs d/c.    Consulted and Agree with Plan of Care Patient        Problem List Patient Active Problem List   Diagnosis Date Noted  . Obesity, Class III, BMI 40-49.9 (morbid obesity) ([place]) 08/27/2015  . OAB (overactive bladder) 08/27/2015  . Mild diastolic dysfunction [id number]  . LVH (left ventricular hypertrophy) due to hypertensive disease 11/23/2014  . Prolonged Q-T interval on ECG 11/04/2014  . OSA treated with BiPAP 09/01/2014  . IFG (impaired fasting glucose) 09/01/2014  . Severe obesity (BMI >= 40) ([place]) 01/29/2014  . BPH (benign prostatic hyperplasia) 01/28/2013  . Obesity 02/09/2012  . Hypertension 10/12/2011  . GERD (gastroesophageal reflux disease) 10/12/2011  . Hyperlipidemia 10/12/2011   Kerin Perna, PTA 01/04/2016 10:17 AM  [facility] [place], [place], [address] Phone: [phone]   Fax:  [phone]  Name: Tracy Bennett MRN: [id number] Date of Birth: Mar 03, 1946

## 2016-01-07 ENCOUNTER — Ambulatory Visit (INDEPENDENT_AMBULATORY_CARE_PROVIDER_SITE_OTHER): Payer: Medicare Other | Admitting: Physical Therapy

## 2016-01-07 DIAGNOSIS — R531 Weakness: Secondary | ICD-10-CM

## 2016-01-07 DIAGNOSIS — M2560 Stiffness of unspecified joint, not elsewhere classified: Secondary | ICD-10-CM

## 2016-01-07 DIAGNOSIS — R293 Abnormal posture: Secondary | ICD-10-CM | POA: Diagnosis not present

## 2016-01-07 DIAGNOSIS — M79642 Pain in left hand: Secondary | ICD-10-CM | POA: Diagnosis not present

## 2016-01-07 NOTE — Therapy (Signed)
[place], [place], [address] Phone: [phone]   Fax:  [phone]  Physical Therapy Treatment  Patient Details  Name: Tracy Bennett MRN: [id number] Date of Birth: 1946/03/28 Referring Provider: Dr. Veatrice Kells  Encounter Date: 01/07/2016      PT End of Session - 01/07/16 0938    Visit Number 8   Number of Visits 8   Date for PT Re-Evaluation 01/12/16   PT Start Time 0935  pt arrived late   PT Stop Time 1018   PT Time Calculation (min) 43 min      Past Medical History  Diagnosis Date  . Hypertension   . Hyperlipidemia   . Kidney stones     x 4   . GERD (gastroesophageal reflux disease)     Past Surgical History  Procedure Laterality Date  . Left knee surgery      ACL repair   . Umbilical hernia repair    . Carpal tunnel release      Right   . Cervical fusion  2000    C5-6  . Foot tendon surgery  11/14/2011    Dr. Sharol Given    There were no vitals filed for this visit.  Visit Diagnosis:  Weakness generalized  Pain of left hand  Generalized stiffness  Abnormal posture      Subjective Assessment - 01/07/16 0940    Subjective Pt used leaf blower yesterday without any issues.     Currently in Pain? No/denies            [facility] PT Assessment - 01/07/16 0001    Assessment   Medical Diagnosis cervical stenosis, with surgery, Lt carpal tunnel release    Referring Provider Dr. Veatrice Kells   Onset Date/Surgical Date 10/25/16   Hand Dominance Right   Next MD Visit 01/20/16   Observation/Other Assessments   Focus on Therapeutic Outcomes (FOTO)  48% limited    ROM / Strength   AROM / PROM / Strength AROM;Strength   AROM   AROM Assessment Site Cervical   Cervical Flexion 49   Cervical Extension 32   Cervical - Right Side Bend 30   Cervical - Left Side Bend 25   Cervical - Right Rotation 60   Cervical - Left Rotation 60   Strength   Strength Assessment Site Wrist   Right/Left Wrist Left   Left Wrist Flexion 5/5   Left Wrist Extension 5/5   Right/Left hand Right;Left   Right Hand Grip (lbs) 105   Left Hand Grip (lbs) 80           [facility] Adult PT Treatment/Exercise - 01/07/16 0001    Exercises   Exercises Neck   Neck Exercises: Supine   Other Supine Exercise deep neck flexors, slow head lifts x 5 reps x 2 sets.    Shoulder Exercises: Supine   Horizontal ABduction Both;10 reps;Strengthening;Theraband   Theraband Level (Shoulder Horizontal ABduction) Level 3 (Green)   External Rotation Strengthening;Both;10 reps;Theraband   Theraband Level (Shoulder External Rotation) Level 3 (Green)   Flexion Strengthening;Both;10 reps;Theraband   Theraband Level (Shoulder Flexion) Level 3 (Green)   Other Supine Exercises D1 flexion, 10 reps, green band each side (sash)   Shoulder Exercises: ROM/Strengthening   UBE (Upper Arm Bike) L3 x 4' alt FWD/BWD   Shoulder Exercises: Stretch   Other Shoulder Stretches doorway stretch , low and mid-level arms x 20 sec each; high level with unilateral arm x 20 sec  Wrist Exercises   Other wrist exercises stretch wrist flex/ext 30 sec x 2 reps Lt  2 sets.  Lt thumb stretch.    Modalities   Modalities Ultrasound   Ultrasound   Ultrasound Location Lt wrist (incision area/ thenar and hypothenar eminance   Ultrasound Parameters 100%, 3.3 mHz, 1.2 w/cm 2, 8 min    Ultrasound Goals Pain  tightness   Neck Exercises: Stretches   Upper Trapezius Stretch 2 reps;30 seconds                     PT Long Term Goals - 01/07/16 1126    PT LONG TERM GOAL #1   Title I wth HEP ( 01/19/16)    Time 4   Period Weeks   Status On-going   PT LONG TERM GOAL #2   Title increase wrist ext/flexion =/> 75 degrees ( 01/19/16)    Time 4   Period Weeks   Status On-going   PT LONG TERM GOAL #3   Title tolerate carrying/using his blower and yard equipment without difficulty ( 01/19/16)    Time 4   Period Weeks   Status Achieved    PT LONG TERM GOAL #4   Title increase strength in Lt wrist =/> 5-/5 ( 01/19/16)    Time 4   Period Weeks   Status Achieved   PT LONG TERM GOAL #5   Title improve Lt grip =/> 50# ( 01/19/16)    Time 4   Period Weeks   Status Achieved   PT LONG TERM GOAL #6   Title improve FOTO =/< 39% limited,  ( 01/19/16)    Time 4   Period Weeks   Status On-going               Plan - 01/07/16 1128    Clinical Impression Statement Pt demonstrated improved Lt wrist strength, has met LTG #4.  Improved cervical ROM as well.  Pt required some VC for improved exercise for shoulders; able to tolerate increased resistance without increase in symptoms.  Pt has met LTG #3 and is making good progress towards remaining goals.  Pt is interested in continuation of therapy to meet stated goals.    Pt will benefit from skilled therapeutic intervention in order to improve on the following deficits Postural dysfunction;Decreased strength;Pain;Impaired UE functional use;Decreased range of motion   Rehab Potential Good   PT Frequency 2x / week   PT Duration 4 weeks   PT Treatment/Interventions Ultrasound;Neuromuscular re-education;Patient/family education;Passive range of motion;Dry needling;Electrical Stimulation;Cryotherapy;Moist Heat;Therapeutic exercise;Manual techniques;Vasopneumatic Device   PT Next Visit Plan Spoke with supervising PT regarding pt's progress and desire to continue therapy.  Will continue progressive strengthening/stretching for neck and wrist.    Consulted and Agree with Plan of Care Patient        Problem List Patient Active Problem List   Diagnosis Date Noted  . Obesity, Class III, BMI 40-49.9 (morbid obesity) ([place]) 08/27/2015  . OAB (overactive bladder) 08/27/2015  . Mild diastolic dysfunction 93/81/0175  . LVH (left ventricular hypertrophy) due to hypertensive disease 11/23/2014  . Prolonged Q-T interval on ECG 11/04/2014  . OSA treated with BiPAP 09/01/2014  . IFG (impaired  fasting glucose) 09/01/2014  . Severe obesity (BMI >= 40) ([place]) 01/29/2014  . BPH (benign prostatic hyperplasia) 01/28/2013  . Obesity 02/09/2012  . Hypertension 10/12/2011  . GERD (gastroesophageal reflux disease) 10/12/2011  . Hyperlipidemia 10/12/2011   Kerin Perna, PTA 01/07/2016 11:45 AM  [facility] [place], [place], [address] Phone: [phone]   Fax:  [phone]  Name: Ashaz Robling MRN: [id number] Date of Birth: 10-05-1946

## 2016-01-11 ENCOUNTER — Ambulatory Visit (INDEPENDENT_AMBULATORY_CARE_PROVIDER_SITE_OTHER): Payer: Medicare Other | Admitting: Physical Therapy

## 2016-01-11 DIAGNOSIS — R531 Weakness: Secondary | ICD-10-CM

## 2016-01-11 DIAGNOSIS — M2560 Stiffness of unspecified joint, not elsewhere classified: Secondary | ICD-10-CM

## 2016-01-11 DIAGNOSIS — M79642 Pain in left hand: Secondary | ICD-10-CM | POA: Diagnosis not present

## 2016-01-11 DIAGNOSIS — R293 Abnormal posture: Secondary | ICD-10-CM

## 2016-01-11 NOTE — Patient Instructions (Signed)
High Row: Standing    Face anchor, feet shoulder width apart. Palms down, pull arms back, squeezing shoulder blades together. Repeat _10_ times per set. Do _2_ sets per session. Do _3 _ sessions per week. Anchor Height: Chest  [email or web address]    [facility] [place], [place] [address]  [phone] (office) [phone] (fax)

## 2016-01-11 NOTE — Therapy (Signed)
[place], [place], [address] Phone: [phone]   Fax:  [phone]  Physical Therapy Treatment  Patient Details  Name: Tracy Bennett MRN: [id number] Date of Birth: May 25, 1946 Referring Provider: Dr. Veatrice Kells   Encounter Date: 01/11/2016      PT End of Session - 01/11/16 0940    Visit Number 9   Number of Visits 12   Date for PT Re-Evaluation 01/20/16   PT Start Time 0935   PT Stop Time 1028   PT Time Calculation (min) 53 min      Past Medical History  Diagnosis Date  . Hypertension   . Hyperlipidemia   . Kidney stones     x 4   . GERD (gastroesophageal reflux disease)     Past Surgical History  Procedure Laterality Date  . Left knee surgery      ACL repair   . Umbilical hernia repair    . Carpal tunnel release      Right   . Cervical fusion  2000    C5-6  . Foot tendon surgery  11/14/2011    Dr. Sharol Given    There were no vitals filed for this visit.  Visit Diagnosis:  Weakness generalized  Pain of left hand  Generalized stiffness  Abnormal posture      Subjective Assessment - 01/11/16 0937    Subjective "I think I overdid it over the weekend" with yard work.  Pt complaining of neck pain today.  Pt returned to taking oxycodone for pain relief over weekend.     Currently in Pain? Yes   Pain Score 5    Pain Location Neck   Pain Orientation Posterior   Pain Descriptors / Indicators Aching;Dull   Aggravating Factors  yard work.    Pain Relieving Factors heat, ice             [facility] PT Assessment - 01/11/16 0001    Assessment   Medical Diagnosis cervical stenosis, with surgery, Lt carpal tunnel release    Referring Provider Dr. Veatrice Kells    Onset Date/Surgical Date 10/25/16   Hand Dominance Right   Next MD Visit 01/20/16          [facility] Adult PT Treatment/Exercise - 01/11/16 0001    Shoulder Exercises: Supine   Horizontal ABduction Both;10  reps;Strengthening;Theraband   Theraband Level (Shoulder Horizontal ABduction) Level 3 (Green)   External Rotation Strengthening;Both;10 reps;Theraband   Theraband Level (Shoulder External Rotation) Level 3 (Green)   Flexion Strengthening;Both;10 reps;Theraband   Theraband Level (Shoulder Flexion) Level 3 (Green)   Other Supine Exercises D1 flexion, 10 reps, green band each side (sash)   Shoulder Exercises: ROM/Strengthening   UBE (Upper Arm Bike) L2 x 4' alt FWD/BWD   Shoulder Exercises: Stretch   Other Shoulder Stretches doorway stretch , low and mid-level arms x 20 sec each; high level with unilateral arm x 20 sec    Other Shoulder Stretches supine snow angels to tolerance x 10 reps    Modalities   Modalities Electrical Stimulation;Ultrasound   Cryotherapy   Number Minutes Cryotherapy 15 Minutes   Cryotherapy Location Cervical   Type of Cryotherapy Ice pack   Electrical Stimulation   Electrical Stimulation Location cervical paraspinals and upper trap    Electrical Stimulation Action IFC   Electrical Stimulation Parameters to tolerance    Electrical Stimulation Goals Pain   Ultrasound   Ultrasound Location Lt wrist (incision/ thenar emminance)  Ultrasound Parameters 100%, 3.3 mHz, 1.2 w/cm2, 8 min    Ultrasound Goals Pain  tightness   Manual Therapy   Manual Therapy Myofascial release   Myofascial Release suboccipital release, MFR to bilat upper trap and cervical paraspinals.       Education:  Pt issued green band. Pt issued handout with HEP of rowing (to perform in addition to existing HEP). Pt verbalized understanding.       PT Long Term Goals - 01/11/16 1117    PT LONG TERM GOAL #1   Title I wth HEP ( 01/19/16)    Time 4   Period Weeks   PT LONG TERM GOAL #2   Title increase wrist ext/flexion =/> 75 degrees ( 01/19/16)    Time 4   Period Weeks   Status On-going   PT LONG TERM GOAL #3   Title tolerate carrying/using his blower and yard equipment without difficulty  ( 01/19/16)    Time 4   Period Weeks   Status Achieved   PT LONG TERM GOAL #4   Title increase strength in Lt wrist =/> 5-/5 ( 01/19/16)    Time 4   Period Weeks   Status Achieved   PT LONG TERM GOAL #5   Title improve Lt grip =/> 50# ( 01/19/16)    Time 4   Period Weeks   Status Achieved   PT LONG TERM GOAL #6   Title improve FOTO =/< 39% limited,  ( 01/19/16)    Time 4   Period Weeks   Status On-going               Plan - 01/11/16 1113    Clinical Impression Statement Pt had flare up in neck over weekend, present with increased neck pain.  Pt tolerated supine exercises without increase in pain.  Pt reported decrease in neck pain at end of session after ice/ estim.     Pt will benefit from skilled therapeutic intervention in order to improve on the following deficits Postural dysfunction;Decreased strength;Pain;Impaired UE functional use;Decreased range of motion   Rehab Potential Good   PT Frequency 2x / week   PT Duration 4 weeks   PT Treatment/Interventions Ultrasound;Neuromuscular re-education;Patient/family education;Passive range of motion;Dry needling;Electrical Stimulation;Cryotherapy;Moist Heat;Therapeutic exercise;Manual techniques;Vasopneumatic Device   PT Next Visit Plan Continue progressive strengthening/stretching for neck and wrist.    Consulted and Agree with Plan of Care Patient        Problem List Patient Active Problem List   Diagnosis Date Noted  . Obesity, Class III, BMI 40-49.9 (morbid obesity) ([place]) 08/27/2015  . OAB (overactive bladder) 08/27/2015  . Mild diastolic dysfunction [id number]  . LVH (left ventricular hypertrophy) due to hypertensive disease 11/23/2014  . Prolonged Q-T interval on ECG 11/04/2014  . OSA treated with BiPAP 09/01/2014  . IFG (impaired fasting glucose) 09/01/2014  . Severe obesity (BMI >= 40) ([place]) 01/29/2014  . BPH (benign prostatic hyperplasia) 01/28/2013  . Obesity 02/09/2012  . Hypertension 10/12/2011  . GERD  (gastroesophageal reflux disease) 10/12/2011  . Hyperlipidemia 10/12/2011    Kerin Perna, PTA 01/11/2016 11:18 AM  [place], [place], [address] Phone: [phone]   Fax:  [phone]  Name: Tracy Bennett MRN: [id number] Date of Birth: 03-24-1946

## 2016-01-14 ENCOUNTER — Ambulatory Visit (INDEPENDENT_AMBULATORY_CARE_PROVIDER_SITE_OTHER): Payer: Medicare Other | Admitting: Physical Therapy

## 2016-01-14 DIAGNOSIS — R293 Abnormal posture: Secondary | ICD-10-CM

## 2016-01-14 DIAGNOSIS — M79642 Pain in left hand: Secondary | ICD-10-CM

## 2016-01-14 DIAGNOSIS — M2560 Stiffness of unspecified joint, not elsewhere classified: Secondary | ICD-10-CM | POA: Diagnosis not present

## 2016-01-14 DIAGNOSIS — R531 Weakness: Secondary | ICD-10-CM

## 2016-01-14 NOTE — Therapy (Addendum)
[place], [place], [address] Phone: [phone]   Fax:  [phone]  Physical Therapy Treatment  Patient Details  Name: Tracy Bennett MRN: [id number] Date of Birth: 02/11/1946 Referring Provider: Dr. Veatrice Kells   Encounter Date: 01/14/2016      PT End of Session - 01/14/16 0934    Visit Number 10   Number of Visits 12   Date for PT Re-Evaluation 01/20/16   PT Start Time 0930   PT Stop Time 3212   PT Time Calculation (min) 65 min   Activity Tolerance Patient tolerated treatment well;No increased pain      Past Medical History  Diagnosis Date  . Hypertension   . Hyperlipidemia   . Kidney stones     x 4   . GERD (gastroesophageal reflux disease)     Past Surgical History  Procedure Laterality Date  . Left knee surgery      ACL repair   . Umbilical hernia repair    . Carpal tunnel release      Right   . Cervical fusion  2000    C5-6  . Foot tendon surgery  11/14/2011    Dr. Sharol Given    There were no vitals filed for this visit.  Visit Diagnosis:  Weakness generalized  Pain of left hand  Generalized stiffness  Abnormal posture          [facility] PT Assessment - 01/14/16 0001    AROM   Right Wrist Extension 75 Degrees   Right Wrist Flexion 81 Degrees   Left Wrist Extension 77 Degrees   Left Wrist Flexion 75 Degrees   Strength   Right/Left hand Right;Left   Right Hand Grip (lbs) 105   Left Hand Grip (lbs) 90          [facility] Adult PT Treatment/Exercise - 01/14/16 0001    Shoulder Exercises: Standing   External Rotation Both;10 reps;Theraband   Theraband Level (Shoulder External Rotation) Level 2 (Red)   Extension Strengthening;12 reps;Theraband   Theraband Level (Shoulder Extension) Level 2 (Red)   Row Strengthening;Both;12 reps   Theraband Level (Shoulder Row) Level 3 (Green)   Other Standing Exercises D2 sash with red band x 10 each arm  - with mirror for visual  feedback.    Shoulder Exercises: ROM/Strengthening   UBE (Upper Arm Bike) L3 x 4' alt FWD/BWD   Shoulder Exercises: Stretch   Other Shoulder Stretches doorway stretch , low and mid-level arms x 20 sec each; high level with unilateral arm x 20 sec    Other Shoulder Stretches supine snow angels to tolerance x 10 reps    Wrist Exercises   Other wrist exercises stretch wrist flex/ext 30 sec x 2 reps Lt  2 sets.  Lt thumb stretch.    Cryotherapy   Number Minutes Cryotherapy 15 Minutes   Cryotherapy Location Cervical   Type of Cryotherapy Ice pack   Electrical Stimulation   Electrical Stimulation Location cervical paraspinals and upper trap    Electrical Stimulation Action IFC   Electrical Stimulation Parameters to tolerance    Electrical Stimulation Goals Pain   Ultrasound   Ultrasound Location Lt wrist (incision area) x 5 min; cervical paraspinals and Lt upper trap x 9 min    Ultrasound Parameters 100%, 3.3 mHz, 1.2 w/cm2    Ultrasound Goals Pain   Neck Exercises: Stretches   Upper Trapezius Stretch 30 seconds;3 reps  with sheet anchoring shoulder    Levator  Stretch 2 reps;20 seconds                PT Education - 01/14/16 1057    Education provided Yes   Education Details HEP   Person(s) Educated Patient   Methods Handout;Demonstration;Explanation   Comprehension Verbalized understanding;Returned demonstration             PT Long Term Goals - 01/14/16 1055    PT LONG TERM GOAL #1   Title I wth HEP ( 01/19/16)    Time 4   Period Weeks   Status On-going   PT LONG TERM GOAL #2   Title increase wrist ext/flexion =/> 75 degrees ( 01/19/16)    Time 4   Period Weeks   Status Achieved   PT LONG TERM GOAL #3   Title tolerate carrying/using his blower and yard equipment without difficulty ( 01/19/16)    Time 4   Period Weeks   Status Achieved   PT LONG TERM GOAL #4   Title increase strength in Lt wrist =/> 5-/5 ( 01/19/16)    Time 4   Period Weeks   Status  Achieved   PT LONG TERM GOAL #5   Title improve Lt grip =/> 50# ( 01/19/16)    Time 4   Period Weeks   Status Achieved   PT LONG TERM GOAL #6   Title improve FOTO =/< 39% limited,  ( 01/19/16)    Time 4   Period Weeks   Status On-going  scored 42% limited, improved from 48% 1 wk ago.                Plan - 01/14/16 0950    Clinical Impression Statement Pt demonstrated improved Lt wrist ROM; has met LTG #2. Pt tolerated standing postural exercises without increase in pain. Pt reported decreased tension and pain in neck at end of session.  Progressing towards remaining goals.    Pt will benefit from skilled therapeutic intervention in order to improve on the following deficits Postural dysfunction;Decreased strength;Pain;Impaired UE functional use;Decreased range of motion   Rehab Potential Good   PT Frequency 2x / week   PT Duration 4 weeks   PT Treatment/Interventions Ultrasound;Neuromuscular re-education;Patient/family education;Passive range of motion;Dry needling;Electrical Stimulation;Cryotherapy;Moist Heat;Therapeutic exercise;Manual techniques;Vasopneumatic Device   PT Next Visit Plan Continue progressive strengthening/stretching for neck and wrist.  Write MD note.    Consulted and Agree with Plan of Care Patient        Problem List Patient Active Problem List   Diagnosis Date Noted  . Obesity, Class III, BMI 40-49.9 (morbid obesity) ([place]) 08/27/2015  . OAB (overactive bladder) 08/27/2015  . Mild diastolic dysfunction 55/97/4163  . LVH (left ventricular hypertrophy) due to hypertensive disease 11/23/2014  . Prolonged Q-T interval on ECG 11/04/2014  . OSA treated with BiPAP 09/01/2014  . IFG (impaired fasting glucose) 09/01/2014  . Severe obesity (BMI >= 40) ([place]) 01/29/2014  . BPH (benign prostatic hyperplasia) 01/28/2013  . Obesity 02/09/2012  . Hypertension 10/12/2011  . GERD (gastroesophageal reflux disease) 10/12/2011  . Hyperlipidemia 10/12/2011     Kerin Perna, PTA 01/14/2016 10:57 AM  [facility] [place], [place], [address] Phone: [phone]   Fax:  [phone]  Name: Tracy Bennett MRN: [id number] Date of Birth: February 02, 1946

## 2016-01-14 NOTE — Patient Instructions (Signed)
Resisted External Rotation: in Neutral - Bilateral   PALMS UP Sit or stand, tubing in both hands, elbows at sides, bent to 90, forearms forward. Pinch shoulder blades together and rotate forearms out. Keep elbows at sides. Repeat __10__ times per set. Do _2-3___ sets per session. Do _2-3___ sessions per day.   Low Row: Standing   Face anchor, feet shoulder width apart. Palms up, pull arms back, squeezing shoulder blades together. Repeat 10__ times per set. Do 2-3__ sets per session. Do 2-3__ sessions per week. Anchor Height: Waist     Strengthening: Resisted Extension   Hold tubing in right hand, arm forward. Pull arm back, elbow straight. Repeat _10___ times per set. Do 2-3____ sets per session. Do 2-3____ sessions per day.   [facility] [place], [place] [address]  [phone] (office) [phone] (fax)

## 2016-01-18 ENCOUNTER — Ambulatory Visit (INDEPENDENT_AMBULATORY_CARE_PROVIDER_SITE_OTHER): Payer: Medicare Other | Admitting: Physical Therapy

## 2016-01-18 DIAGNOSIS — R293 Abnormal posture: Secondary | ICD-10-CM

## 2016-01-18 DIAGNOSIS — R531 Weakness: Secondary | ICD-10-CM

## 2016-01-18 DIAGNOSIS — M2560 Stiffness of unspecified joint, not elsewhere classified: Secondary | ICD-10-CM

## 2016-01-18 DIAGNOSIS — M79642 Pain in left hand: Secondary | ICD-10-CM | POA: Diagnosis not present

## 2016-01-18 NOTE — Therapy (Signed)
[place], [place], [address] Phone: [phone]   Fax:  [phone]  Physical Therapy Treatment  Patient Details  Name: Tracy Bennett MRN: [id number] Date of Birth: 08/17/1946 Referring Provider: Dr. Veatrice Kells   Encounter Date: 01/18/2016      PT End of Session - 01/18/16 0851    Visit Number 11   Number of Visits 12   Date for PT Re-Evaluation 01/20/16   PT Start Time [id number]   PT Stop Time 0948   PT Time Calculation (min) 61 min      Past Medical History  Diagnosis Date  . Hypertension   . Hyperlipidemia   . Kidney stones     x 4   . GERD (gastroesophageal reflux disease)     Past Surgical History  Procedure Laterality Date  . Left knee surgery      ACL repair   . Umbilical hernia repair    . Carpal tunnel release      Right   . Cervical fusion  2000    C5-6  . Foot tendon surgery  11/14/2011    Dr. Sharol Given    There were no vitals filed for this visit.  Visit Diagnosis:  Weakness generalized  Pain of left hand  Generalized stiffness  Abnormal posture      Subjective Assessment - 01/18/16 0851    Subjective Pt complains that he has continued soreness/ stiffness in neck. Pt had relief from last session until he worked in his yard yesterday.  His Lt hand is no longer bothering him, "except for when I mash it".     Currently in Pain? Yes   Pain Score 4    Pain Location Neck   Pain Orientation Right;Left;Posterior   Pain Descriptors / Indicators Dull;Sore   Aggravating Factors  yard work   Pain Relieving Factors heat, ice.             [facility] PT Assessment - 01/18/16 0001    Assessment   Medical Diagnosis cervical stenosis, with surgery, Lt carpal tunnel release    Referring Provider Dr. Veatrice Kells    Onset Date/Surgical Date 10/25/16   Hand Dominance Right   Next MD Visit 01/20/16   AROM   Cervical Flexion 48   Cervical Extension 35   Cervical - Right Side  Bend 32   Cervical - Left Side Bend 27  with pain    Cervical - Right Rotation 60   Cervical - Left Rotation 60   Strength   Right/Left hand Right;Left   Right Hand Grip (lbs) 115   Left Hand Grip (lbs) 91           [facility] Adult PT Treatment/Exercise - 01/18/16 0001    Neck Exercises: Supine   Other Supine Exercise deep neck flexors, slow head lifts x 5 reps x 2 sets.    Shoulder Exercises: Standing   External Rotation Strengthening;Both;10 reps;Theraband   Theraband Level (Shoulder External Rotation) Level 3 (Green)  with scap squeeze at pool noodle   Shoulder Exercises: ROM/Strengthening   UBE (Upper Arm Bike) L3 x 4' alt FWD/BWD   Shoulder Exercises: Stretch   Other Shoulder Stretches doorway stretch , low and mid-level arms x 20 sec each; high level with unilateral arm x 20 sec    Other Shoulder Stretches supine snow angels to tolerance x 10 reps    Wrist Exercises   Wrist Radial Deviation Strengthening;Left;10 reps;Theraband  2 sets  Theraband Level (Radial Deviation) Level 3 (Green)   Other wrist exercises Lt Wrist flex and ext with green band x 10 reps x 2 sets    Other wrist exercises Lt wrist flex/ ext stretch x 30 sec x 3 reps; Lt thumb stretch    Cryotherapy   Number Minutes Cryotherapy 15 Minutes   Cryotherapy Location Cervical   Type of Cryotherapy Ice pack   Electrical Stimulation   Electrical Stimulation Location cervical paraspinals and upper trap    Electrical Stimulation Action IFC   Electrical Stimulation Parameters to tolerance    Electrical Stimulation Goals Pain   Manual Therapy   Manual Therapy Myofascial release;Passive ROM;Soft tissue mobilization   Soft tissue mobilization to upper trap, levator    Myofascial Release suboccipital release, MFR to bilat upper trap and Rt scalene   Passive ROM lateral neck flexion Lt (gentle)                     PT Long Term Goals - 01/14/16 1055    PT LONG TERM GOAL #1   Title I wth HEP (  01/19/16)    Time 4   Period Weeks   Status On-going   PT LONG TERM GOAL #2   Title increase wrist ext/flexion =/> 75 degrees ( 01/19/16)    Time 4   Period Weeks   Status Achieved   PT LONG TERM GOAL #3   Title tolerate carrying/using his blower and yard equipment without difficulty ( 01/19/16)    Time 4   Period Weeks   Status Achieved   PT LONG TERM GOAL #4   Title increase strength in Lt wrist =/> 5-/5 ( 01/19/16)    Time 4   Period Weeks   Status Achieved   PT LONG TERM GOAL #5   Title improve Lt grip =/> 50# ( 01/19/16)    Time 4   Period Weeks   Status Achieved   PT LONG TERM GOAL #6   Title improve FOTO =/< 39% limited,  ( 01/19/16)    Time 4   Period Weeks   Status On-going  scored 42% limited, improved from 48% 1 wk ago.                Plan - 01/18/16 1254    Clinical Impression Statement Pt neck ROM similar to last assessment.  Pt tolerated new exercises without increase in symptoms.  Pt reported decreased soreness and pain at end of session, with manual therapy and estim/ice.  Pt making good progress towards remaining goals.    Pt will benefit from skilled therapeutic intervention in order to improve on the following deficits Postural dysfunction;Decreased strength;Pain;Impaired UE functional use;Decreased range of motion   Rehab Potential Good   PT Frequency 2x / week   PT Duration 4 weeks   PT Treatment/Interventions Ultrasound;Neuromuscular re-education;Patient/family education;Passive range of motion;Dry needling;Electrical Stimulation;Cryotherapy;Moist Heat;Therapeutic exercise;Manual techniques;Vasopneumatic Device   PT Next Visit Plan Pt returning to MD, will await any new instructions.  Assess goals and need for continuation of therapy vs d/c to HEP.    Consulted and Agree with Plan of Care Patient        Problem List Patient Active Problem List   Diagnosis Date Noted  . Obesity, Class III, BMI 40-49.9 (morbid obesity) ([place]) 08/27/2015  . OAB  (overactive bladder) 08/27/2015  . Mild diastolic dysfunction [id number]  . LVH (left ventricular hypertrophy) due to hypertensive disease 11/23/2014  . Prolonged Q-T interval on ECG  11/04/2014  . OSA treated with BiPAP 09/01/2014  . IFG (impaired fasting glucose) 09/01/2014  . Severe obesity (BMI >= 40) ([place]) 01/29/2014  . BPH (benign prostatic hyperplasia) 01/28/2013  . Obesity 02/09/2012  . Hypertension 10/12/2011  . GERD (gastroesophageal reflux disease) 10/12/2011  . Hyperlipidemia 10/12/2011   Kerin Perna, PTA 01/18/2016 1:00 PM  [place], [place], [address] Phone: [phone]   Fax:  [phone]  Name: Anyelo Shockley MRN: [id number] Date of Birth: 11-15-1946

## 2016-01-20 DIAGNOSIS — M4802 Spinal stenosis, cervical region: Secondary | ICD-10-CM | POA: Diagnosis not present

## 2016-01-20 DIAGNOSIS — Z6841 Body Mass Index (BMI) 40.0 and over, adult: Secondary | ICD-10-CM | POA: Diagnosis not present

## 2016-01-26 ENCOUNTER — Encounter: Payer: Self-pay | Admitting: Physical Therapy

## 2016-01-26 ENCOUNTER — Ambulatory Visit (INDEPENDENT_AMBULATORY_CARE_PROVIDER_SITE_OTHER): Payer: Medicare Other | Admitting: Physical Therapy

## 2016-01-26 DIAGNOSIS — M542 Cervicalgia: Secondary | ICD-10-CM

## 2016-01-26 DIAGNOSIS — R293 Abnormal posture: Secondary | ICD-10-CM

## 2016-01-26 DIAGNOSIS — M2560 Stiffness of unspecified joint, not elsewhere classified: Secondary | ICD-10-CM | POA: Diagnosis not present

## 2016-01-26 NOTE — Therapy (Signed)
[place], [place], [address] Phone: [phone]   Fax:  [phone]  Physical Therapy Treatment  Patient Details  Name: Tracy Bennett MRN: [id number] Date of Birth: 09-06-1946 Referring Provider: Dr Leeroy Cha  Encounter Date: 01/26/2016      PT End of Session - 01/26/16 0936    Visit Number 12   Number of Visits 20   Date for PT Re-Evaluation 02/23/16   PT Start Time 0936   PT Stop Time 1033   PT Time Calculation (min) 57 min      Past Medical History  Diagnosis Date  . Hypertension   . Hyperlipidemia   . Kidney stones     x 4   . GERD (gastroesophageal reflux disease)     Past Surgical History  Procedure Laterality Date  . Left knee surgery      ACL repair   . Umbilical hernia repair    . Carpal tunnel release      Right   . Cervical fusion  2000    C5-6  . Foot tendon surgery  11/14/2011    Dr. Sharol Given    There were no vitals filed for this visit.  Visit Diagnosis:  Abnormal posture - Plan: PT plan of care cert/re-cert  Generalized stiffness - Plan: PT plan of care cert/re-cert  Cervical pain (neck) - Plan: PT plan of care cert/re-cert      Subjective Assessment - 01/26/16 0936    Subjective Tracy Bennett saw the MD and he wants him to have his neck treated now.  Explained to the patient the surgery and the plate in the front of his spine for the fusion.  Told him his singing voice may take up to 6 months to get better.    Patient Stated Goals increase strength in arms, perform yard work/use blower, golf, work on his computer   Currently in Pain? No/denies  no only stiffness in his neck            [facility] PT Assessment - 01/26/16 0001    Assessment   Medical Diagnosis cervical stenosis, with surgery, Lt carpal tunnel release    Referring Provider Dr Leeroy Cha   Onset Date/Surgical Date 10/25/16   Hand Dominance Right   Next MD Visit 03/01/16  approximately   ROM /  Strength   AROM / PROM / Strength AROM;Strength   AROM   AROM Assessment Site Cervical   Cervical Flexion 45   Cervical Extension 35   Cervical - Right Side Bend 21  some discomfort   Cervical - Left Side Bend 21  some discomfort   Cervical - Right Rotation 65   Cervical - Left Rotation 54   Strength   Overall Strength Comments bilat shoulders WNL                     [facility] Adult PT Treatment/Exercise - 01/26/16 0001    Neck Exercises: Machines for Strengthening   UBE (Upper Arm Bike) L3x2'FWD, 3'BWD   Modalities   Modalities Electrical Stimulation;Moist Probation officer Location bilat upper traps   Electrical Stimulation Action Premod   Electrical Stimulation Parameters to tolerance   Electrical Stimulation Goals Pain;Tone   Manual Therapy   Manual Therapy Soft tissue mobilization   Soft tissue mobilization to bilat upper traps in sitting, very tight both sides.           Trigger Point Dry  Needling - 01/26/16 1014    Consent Given? Yes   Education Handout Provided No   Muscles Treated Upper Body Upper trapezius  bilat   Upper Trapezius Response Palpable increased muscle length;Twitch reponse elicited                   PT Long Term Goals - 01/26/16 1038    PT LONG TERM GOAL #1   Title I wth HEP ( 02/23/16)    Time 4   Period Weeks   Status On-going   PT LONG TERM GOAL #2   Title increase wrist ext/flexion =/> 75 degrees ( 01/19/16)    Status Achieved   PT LONG TERM GOAL #3   Title tolerate carrying/using his blower and yard equipment without difficulty ( 01/19/16)    Status Achieved   PT LONG TERM GOAL #4   Title increase strength in Lt wrist =/> 5-/5 ( 01/19/16)    Status Achieved   PT LONG TERM GOAL #5   Title improve Lt grip =/> 50# ( 01/19/16)    Status Achieved   PT LONG TERM GOAL #6   Title improve FOTO =/< 39% limited,  ( 02/23/16)    Time 4   Period Weeks   Status On-going   PT LONG TERM  GOAL #7   Title report =/> 75% improvement in reports of cervical stiffness. ( 02/23/16)    Time 4   Period Weeks   Status New   PT LONG TERM GOAL #8   Title report =/> 75% reduction in upper shoulder pain ( 02/23/16   Time 4   Period Weeks   Status New               Plan - 01/26/16 1340    Clinical Impression Statement Tracy Bennett returned to treatment for [place] to focus on his neck.  He is pleased with his Rt hand function and met those goals.  He has significant tightness in bilateral upper shoulders and neck.  OVerall his ROM is fairly good and UE strength good also.  He does have postural changes.    Pt will benefit from skilled therapeutic intervention in order to improve on the following deficits Postural dysfunction;Decreased strength;Pain;Impaired UE functional use;Decreased range of motion   Rehab Potential Good   PT Frequency 2x / week   PT Duration 4 weeks   PT Treatment/Interventions Ultrasound;Neuromuscular re-education;Patient/family education;Passive range of motion;Dry needling;Electrical Stimulation;Cryotherapy;Moist Heat;Therapeutic exercise;Manual techniques;Vasopneumatic Device   PT Next Visit Plan STW/manual therapy to his neck and shoulders.  Postural correction ther ex.    Consulted and Agree with Plan of Care Patient        Problem List Patient Active Problem List   Diagnosis Date Noted  . Obesity, Class III, BMI 40-49.9 (morbid obesity) ([place]) 08/27/2015  . OAB (overactive bladder) 08/27/2015  . Mild diastolic dysfunction 38/46/6599  . LVH (left ventricular hypertrophy) due to hypertensive disease 11/23/2014  . Prolonged Q-T interval on ECG 11/04/2014  . OSA treated with BiPAP 09/01/2014  . IFG (impaired fasting glucose) 09/01/2014  . Severe obesity (BMI >= 40) ([place]) 01/29/2014  . BPH (benign prostatic hyperplasia) 01/28/2013  . Obesity 02/09/2012  . Hypertension 10/12/2011  . GERD (gastroesophageal reflux disease) 10/12/2011  . Hyperlipidemia 10/12/2011     Jeral Pinch PT 01/26/2016, 1:45 PM  [facility] [place], [place], [address] Phone: [phone]   Fax:  [phone]  Name: Tracy Bennett MRN: [id number] Date of  Birth: 1945/12/01

## 2016-02-01 ENCOUNTER — Ambulatory Visit (INDEPENDENT_AMBULATORY_CARE_PROVIDER_SITE_OTHER): Payer: Medicare Other | Admitting: Physical Therapy

## 2016-02-01 DIAGNOSIS — R293 Abnormal posture: Secondary | ICD-10-CM | POA: Diagnosis not present

## 2016-02-01 DIAGNOSIS — R531 Weakness: Secondary | ICD-10-CM | POA: Diagnosis not present

## 2016-02-01 DIAGNOSIS — M542 Cervicalgia: Secondary | ICD-10-CM

## 2016-02-01 DIAGNOSIS — M2560 Stiffness of unspecified joint, not elsewhere classified: Secondary | ICD-10-CM

## 2016-02-01 NOTE — Therapy (Signed)
[place], [place], [address] Phone: [phone]   Fax:  [phone]  Physical Therapy Treatment  Patient Details  Name: Tracy Bennett MRN: [id number] Date of Birth: 05-24-1946 Referring Provider: Dr. Veatrice Kells  Encounter Date: 02/01/2016      PT End of Session - 02/01/16 0935    Visit Number 13   Number of Visits 20   Date for PT Re-Evaluation 02/23/16   PT Start Time 0935   PT Stop Time [id number]   PT Time Calculation (min) 60 min      Past Medical History  Diagnosis Date  . Hypertension   . Hyperlipidemia   . Kidney stones     x 4   . GERD (gastroesophageal reflux disease)     Past Surgical History  Procedure Laterality Date  . Left knee surgery      ACL repair   . Umbilical hernia repair    . Carpal tunnel release      Right   . Cervical fusion  2000    C5-6  . Foot tendon surgery  11/14/2011    Dr. Sharol Given    There were no vitals filed for this visit.  Visit Diagnosis:  No diagnosis found.      Subjective Assessment - 02/01/16 0935    Subjective Pt reports he was sore in shoulders and neck after TDN; "I didn't notice a big difference after the needling".  Pt used a less firm pillow last night, neck doesn't feel as tight. Has been using heat, TENS and biofreeze for pain relief.    Currently in Pain? No/denies  just stiffness             [facility] PT Assessment - 02/01/16 0001    Assessment   Medical Diagnosis cervical stenosis, with surgery, Lt carpal tunnel release    Referring Provider Dr. Veatrice Kells   Onset Date/Surgical Date 10/25/16   Hand Dominance Right   Next MD Visit 03/01/16  approximately   AROM   Cervical Flexion 50   Cervical Extension 35   Cervical - Right Side Bend 30   Cervical - Left Side Bend 31   Cervical - Right Rotation 60   Cervical - Left Rotation 65                     [facility] Adult PT Treatment/Exercise - 02/01/16 0001    Shoulder Exercises: Standing   Extension Both;12 reps;Theraband   Theraband Level (Shoulder Extension) Level 3 (Green)   Other Standing Exercises Rt/Lt shoulder depression with blue band x 15; wood chop x 10 each side with green band; reverse wood chop x 10 each side; shoulder shrug with blue band x 8 reps    Shoulder Exercises: ROM/Strengthening   UBE (Upper Arm Bike) L4 x 4' alt FWD/BWD   Shoulder Exercises: Stretch   Other Shoulder Stretches doorway stretch , low and mid-level arms x 20 sec each; high level with unilateral arm x 20 sec    Cryotherapy   Number Minutes Cryotherapy 15 Minutes   Cryotherapy Location Cervical   Type of Cryotherapy Ice pack   Electrical Stimulation   Electrical Stimulation Location bilat upper traps/ cervical paraspinals   Electrical Stimulation Action IFC   Electrical Stimulation Parameters  to tolerance   pt supine   Electrical Stimulation Goals Pain;Tone   Manual Therapy   Manual Therapy Myofascial release;Passive ROM;Soft tissue mobilization   Soft tissue mobilization to upper  trap, levator, cervical paraspinals.     Myofascial Release suboccipital release, MFR to bilat upper trap and Rt scalene   Neck Exercises: Stretches   Upper Trapezius Stretch 2 reps;10 seconds   Levator Stretch 2 reps;20 seconds                     PT Long Term Goals - 01/26/16 1038    PT LONG TERM GOAL #1   Title I wth HEP ( 02/23/16)    Time 4   Period Weeks   Status On-going   PT LONG TERM GOAL #2   Title increase wrist ext/flexion =/> 75 degrees ( 01/19/16)    Status Achieved   PT LONG TERM GOAL #3   Title tolerate carrying/using his blower and yard equipment without difficulty ( 01/19/16)    Status Achieved   PT LONG TERM GOAL #4   Title increase strength in Lt wrist =/> 5-/5 ( 01/19/16)    Status Achieved   PT LONG TERM GOAL #5   Title improve Lt grip =/> 50# ( 01/19/16)    Status Achieved   PT LONG TERM GOAL #6   Title improve FOTO =/< 39% limited,   ( 02/23/16)    Time 4   Period Weeks   Status On-going   PT LONG TERM GOAL #7   Title report =/> 75% improvement in reports of cervical stiffness. ( 02/23/16)    Time 4   Period Weeks   Status New   PT LONG TERM GOAL #8   Title report =/> 75% reduction in upper shoulder pain ( 02/23/16   Time 4   Period Weeks   Status New               Plan - 02/01/16 1305    Clinical Impression Statement Pt's cervical ROM improved this visit.  Pt tolerated new exercises without any increase in pain.  Pt reported decrease in stiffness of neck with manual therapy and estim.  Progressing towards remaining goals.    Pt will benefit from skilled therapeutic intervention in order to improve on the following deficits Postural dysfunction;Decreased strength;Pain;Impaired UE functional use;Decreased range of motion   Rehab Potential Good   PT Frequency 2x / week   PT Duration 4 weeks   PT Treatment/Interventions Ultrasound;Neuromuscular re-education;Patient/family education;Passive range of motion;Dry needling;Electrical Stimulation;Cryotherapy;Moist Heat;Therapeutic exercise;Manual techniques;Vasopneumatic Device   PT Next Visit Plan STW/manual therapy to his neck and shoulders.  Postural correction ther ex.    Consulted and Agree with Plan of Care Patient        Problem List Patient Active Problem List   Diagnosis Date Noted  . Obesity, Class III, BMI 40-49.9 (morbid obesity) ([place]) 08/27/2015  . OAB (overactive bladder) 08/27/2015  . Mild diastolic dysfunction [id number]  . LVH (left ventricular hypertrophy) due to hypertensive disease 11/23/2014  . Prolonged Q-T interval on ECG 11/04/2014  . OSA treated with BiPAP 09/01/2014  . IFG (impaired fasting glucose) 09/01/2014  . Severe obesity (BMI >= 40) ([place]) 01/29/2014  . BPH (benign prostatic hyperplasia) 01/28/2013  . Obesity 02/09/2012  . Hypertension 10/12/2011  . GERD (gastroesophageal reflux disease) 10/12/2011  . Hyperlipidemia  10/12/2011    Kerin Perna, PTA 02/01/2016 1:10 PM  [facility] [place], [place], [address] Phone: [phone]   Fax:  [phone]  Name: Tracy Bennett MRN: [id number] Date of Birth: [birth year]

## 2016-02-04 ENCOUNTER — Encounter: Payer: Self-pay | Admitting: Rehabilitative and Restorative Service Providers"

## 2016-02-04 ENCOUNTER — Ambulatory Visit (INDEPENDENT_AMBULATORY_CARE_PROVIDER_SITE_OTHER): Payer: Medicare Other | Admitting: Rehabilitative and Restorative Service Providers"

## 2016-02-04 DIAGNOSIS — M2560 Stiffness of unspecified joint, not elsewhere classified: Secondary | ICD-10-CM | POA: Diagnosis not present

## 2016-02-04 DIAGNOSIS — R293 Abnormal posture: Secondary | ICD-10-CM | POA: Diagnosis not present

## 2016-02-04 DIAGNOSIS — M79642 Pain in left hand: Secondary | ICD-10-CM | POA: Diagnosis not present

## 2016-02-04 DIAGNOSIS — M542 Cervicalgia: Secondary | ICD-10-CM | POA: Diagnosis not present

## 2016-02-04 DIAGNOSIS — R531 Weakness: Secondary | ICD-10-CM

## 2016-02-04 NOTE — Patient Instructions (Signed)
Mouse forward keyboard tray  Shoulder Blade Squeeze    Rotate shoulders back, then squeeze shoulder blades down and back. Hold 10 -20 sec Repeat _10___ times. Do __several__ sessions per day.

## 2016-02-04 NOTE — Therapy (Signed)
[place], [place], [address] Phone: [phone]   Fax:  [phone]  Physical Therapy Treatment  Patient Details  Name: Tracy Bennett MRN: [id number] Date of Birth: Jun 27, 1946 Referring Provider: Dr. Veatrice Kells  Encounter Date: 02/04/2016      PT End of Session - 02/04/16 1243    Visit Number 13   Number of Visits 20   Date for PT Re-Evaluation 02/23/16   PT Start Time 0934   PT Stop Time 1035   PT Time Calculation (min) 61 min   Activity Tolerance Patient tolerated treatment well      Past Medical History  Diagnosis Date  . Hypertension   . Hyperlipidemia   . Kidney stones     x 4   . GERD (gastroesophageal reflux disease)     Past Surgical History  Procedure Laterality Date  . Left knee surgery      ACL repair   . Umbilical hernia repair    . Carpal tunnel release      Right   . Cervical fusion  2000    C5-6  . Foot tendon surgery  11/14/2011    Dr. Sharol Given    There were no vitals filed for this visit.  Visit Diagnosis:  Abnormal posture  Generalized stiffness  Cervical pain (neck)  Weakness generalized  Pain of left hand      Subjective Assessment - 02/04/16 0941    Subjective Patient reports that he is sore ths am - always has some stiffness when he wakes up.    Currently in Pain? No/denies  aching and sore around neck and shoulders                          [facility] Adult PT Treatment/Exercise - 02/04/16 0001    Self-Care   Self-Care --  education re Optometrist Activites    Therapeutic Activities --  suboccipital release using golf balls for home 1-2 min    Shoulder Exercises: Standing   Extension Both;12 reps;Theraband   Theraband Level (Shoulder Extension) Level 3 (Green)   Row Strengthening;Both;12 reps   Theraband Level (Shoulder Row) Level 3 (Green)   Other Standing Exercises scap squeeze with noodle 10 sec x 10     Shoulder Exercises: ROM/Strengthening   UBE (Upper Arm Bike) L4 x 4' alt FWD/BWD   Shoulder Exercises: Stretch   Other Shoulder Stretches doorway stretch , low and mid-level arms x 20 sec each; high level with unilateral arm x 20 sec    Other Shoulder Stretches scap retraction - pec stretch with noodle PT assist    Cryotherapy   Number Minutes Cryotherapy 15 Minutes   Cryotherapy Location Cervical   Type of Cryotherapy Ice pack   Electrical Stimulation   Electrical Stimulation Location bilat upper traps/ cervical paraspinals   Electrical Stimulation Action IFC   Electrical Stimulation Parameters to tolerance   Electrical Stimulation Goals Pain;Tone   Manual Therapy   Manual Therapy Myofascial release;Passive ROM;Soft tissue mobilization   Manual therapy comments pt supine    Soft tissue mobilization ant/lat/posterior cervical musculature, upper trap, levator, cervical paraspinals.     Myofascial Release suboccipital release, MFR to bilat upper trap and Rt scalene                PT Education - 02/04/16 0955    Education provided Yes   Education Details HEP   Person(s)  Educated Patient   Methods Explanation;Demonstration;Tactile cues;Verbal cues;Handout   Comprehension Verbalized understanding;Returned demonstration;Verbal cues required;Tactile cues required             PT Long Term Goals - 01/26/16 1038    PT LONG TERM GOAL #1   Title I wth HEP ( 02/23/16)    Time 4   Period Weeks   Status On-going   PT LONG TERM GOAL #2   Title increase wrist ext/flexion =/> 75 degrees ( 01/19/16)    Status Achieved   PT LONG TERM GOAL #3   Title tolerate carrying/using his blower and yard equipment without difficulty ( 01/19/16)    Status Achieved   PT LONG TERM GOAL #4   Title increase strength in Lt wrist =/> 5-/5 ( 01/19/16)    Status Achieved   PT LONG TERM GOAL #5   Title improve Lt grip =/> 50# ( 01/19/16)    Status Achieved   PT LONG TERM GOAL #6   Title improve  FOTO =/< 39% limited,  ( 02/23/16)    Time 4   Period Weeks   Status On-going   PT LONG TERM GOAL #7   Title report =/> 75% improvement in reports of cervical stiffness. ( 02/23/16)    Time 4   Period Weeks   Status New   PT LONG TERM GOAL #8   Title report =/> 75% reduction in upper shoulder pain ( 02/23/16   Time 4   Period Weeks   Status New               Plan - 02/04/16 1243    Clinical Impression Statement Tracy Bennett reports some improvement in symptoms. He continues to demonstrate progress toward stated goals of treatment.    Pt will benefit from skilled therapeutic intervention in order to improve on the following deficits Postural dysfunction;Decreased strength;Pain;Impaired UE functional use;Decreased range of motion   Rehab Potential Good   PT Frequency 2x / week   PT Duration 4 weeks   PT Treatment/Interventions Ultrasound;Neuromuscular re-education;Patient/family education;Passive range of motion;Dry needling;Electrical Stimulation;Cryotherapy;Moist Heat;Therapeutic exercise;Manual techniques;Vasopneumatic Device   PT Next Visit Plan STW/manual therapy to his neck and shoulders.  Postural correction ther ex.    PT Home Exercise Plan posture/alignment; HEP    Consulted and Agree with Plan of Care Patient        Problem List Patient Active Problem List   Diagnosis Date Noted  . Obesity, Class III, BMI 40-49.9 (morbid obesity) ([place]) 08/27/2015  . OAB (overactive bladder) 08/27/2015  . Mild diastolic dysfunction [id number]  . LVH (left ventricular hypertrophy) due to hypertensive disease 11/23/2014  . Prolonged Q-T interval on ECG 11/04/2014  . OSA treated with BiPAP 09/01/2014  . IFG (impaired fasting glucose) 09/01/2014  . Severe obesity (BMI >= 40) ([place]) 01/29/2014  . BPH (benign prostatic hyperplasia) 01/28/2013  . Obesity 02/09/2012  . Hypertension 10/12/2011  . GERD (gastroesophageal reflux disease) 10/12/2011  . Hyperlipidemia 10/12/2011    Tracy Bennett Nilda Simmer PT, MPH  02/04/2016, 12:49 PM  [facility] [place], [place], [address] Phone: [phone]   Fax:  [phone]  Name: Tracy Bennett MRN: [id number] Date of Birth: 29-Aug-1946

## 2016-02-08 ENCOUNTER — Ambulatory Visit (INDEPENDENT_AMBULATORY_CARE_PROVIDER_SITE_OTHER): Payer: Medicare Other | Admitting: Physical Therapy

## 2016-02-08 DIAGNOSIS — M542 Cervicalgia: Secondary | ICD-10-CM

## 2016-02-08 DIAGNOSIS — M2560 Stiffness of unspecified joint, not elsewhere classified: Secondary | ICD-10-CM

## 2016-02-08 DIAGNOSIS — R293 Abnormal posture: Secondary | ICD-10-CM | POA: Diagnosis not present

## 2016-02-08 DIAGNOSIS — R531 Weakness: Secondary | ICD-10-CM | POA: Diagnosis not present

## 2016-02-08 NOTE — Therapy (Signed)
[place], [place], [address] Phone: [phone]   Fax:  [phone]  Physical Therapy Treatment  Patient Details  Name: Tracy Bennett MRN: [id number] Date of Birth: 10/19/1946 Referring Provider: Dr. Veatrice Kells  Encounter Date: 02/08/2016      PT End of Session - 02/08/16 0935    Visit Number 14   Number of Visits 20   Date for PT Re-Evaluation 02/23/16   PT Start Time 0932   PT Stop Time 1031   PT Time Calculation (min) 59 min      Past Medical History  Diagnosis Date  . Hypertension   . Hyperlipidemia   . Kidney stones     x 4   . GERD (gastroesophageal reflux disease)     Past Surgical History  Procedure Laterality Date  . Left knee surgery      ACL repair   . Umbilical hernia repair    . Carpal tunnel release      Right   . Cervical fusion  2000    C5-6  . Foot tendon surgery  11/14/2011    Dr. Sharol Given    There were no vitals filed for this visit.  Visit Diagnosis:  Abnormal posture  Generalized stiffness  Cervical pain (neck)  Weakness generalized      Subjective Assessment - 02/08/16 0937    Subjective Pt reports he doesn't have pain in neck this morning, just stiff. Pt gets achey in back of neck after sitting for awhile watching TV.  Nothing new to report.    Currently in Pain? No/denies            [facility] PT Assessment - 02/08/16 0001    Assessment   Medical Diagnosis cervical stenosis, with surgery, Lt carpal tunnel release    Onset Date/Surgical Date 10/25/16   Hand Dominance Right   Next MD Visit 03/01/16   AR[facility]   Cervical - Right Side Bend 35   Cervical - Left Side Bend 40          [facility] Adult PT Treatment/Exercise - 02/08/16 0001    Shoulder Exercises: Standing   Extension Both;12 reps;Theraband   Theraband Level (Shoulder Extension) Level 3 (Green)   Row Strengthening;Both;12 reps  45 deg abd   Theraband Level (Shoulder Row) Level 3  (Green)   Other Standing Exercises Reverse wood chop with blue band x 10 reps each side   Shoulder Exercises: ROM/Strengthening   UBE (Upper Arm Bike) L5 x 4' alt FWD/BWD   Moist Heat Therapy   Number Minutes Moist Heat 15 Minutes   Moist Heat Location Lumbar Spine   Cryotherapy   Number Minutes Cryotherapy 15 Minutes   Cryotherapy Location Cervical   Type of Cryotherapy Ice pack   Electrical Stimulation   Electrical Stimulation Location bilat upper traps/ cervical paraspinals   Electrical Stimulation Action IFC   Electrical Stimulation Parameters to tolerance    Electrical Stimulation Goals Pain;Tone   Manual Therapy   Manual Therapy Myofascial release;Soft tissue mobilization   Soft tissue mobilization ant/lat/posterior cervical musculature, upper trap, levator, cervical paraspinals.     Myofascial Release suboccipital release, MFR to bilat upper trap and Rt scalene   Neck Exercises: Stretches   Upper Trapezius Stretch 2 reps;10 seconds  towel as anchor   Other Neck Stretches 3 way doorway  sec x 3 reps each position  PT Long Term Goals - 01/26/16 1038    PT LONG TERM GOAL #1   Title I wth HEP ( 02/23/16)    Time 4   Period Weeks   Status On-going   PT LONG TERM GOAL #2   Title increase wrist ext/flexion =/> 75 degrees ( 01/19/16)    Status Achieved   PT LONG TERM GOAL #3   Title tolerate carrying/using his blower and yard equipment without difficulty ( 01/19/16)    Status Achieved   PT LONG TERM GOAL #4   Title increase strength in Lt wrist =/> 5-/5 ( 01/19/16)    Status Achieved   PT LONG TERM GOAL #5   Title improve Lt grip =/> 50# ( 01/19/16)    Status Achieved   PT LONG TERM GOAL #6   Title improve FOTO =/< 39% limited,  ( 02/23/16)    Time 4   Period Weeks   Status On-going   PT LONG TERM GOAL #7   Title report =/> 75% improvement in reports of cervical stiffness. ( 02/23/16)    Time 4   Period Weeks   Status New   PT LONG TERM  GOAL #8   Title report =/> 75% reduction in upper shoulder pain ( 02/23/16   Time 4   Period Weeks   Status New               Plan - 02/08/16 1316    Clinical Impression Statement Pt tolerated all exercises with no increase in neck pain/symptoms, however reverse wood chop caused increased discomfort in LB (pt reported after completing activity).  Pt reported decreased stiffness/soreness at end of session.  Progressing towards remaining goals.  Pt demonstrated improved lateral neck ROM.    Pt will benefit from skilled therapeutic intervention in order to improve on the following deficits Postural dysfunction;Decreased strength;Pain;Impaired UE functional use;Decreased range of motion   Rehab Potential Good   PT Frequency 2x / week   PT Duration 4 weeks   PT Treatment/Interventions Ultrasound;Neuromuscular re-education;Patient/family education;Passive range of motion;Dry needling;Electrical Stimulation;Cryotherapy;Moist Heat;Therapeutic exercise;Manual techniques;Vasopneumatic Device   PT Next Visit Plan STW/manual therapy to his neck and shoulders.  Postural correction ther ex.    Consulted and Agree with Plan of Care Patient        Problem List Patient Active Problem List   Diagnosis Date Noted  . Obesity, Class III, BMI 40-49.9 (morbid obesity) ([place]) 08/27/2015  . OAB (overactive bladder) 08/27/2015  . Mild diastolic dysfunction [id number]  . LVH (left ventricular hypertrophy) due to hypertensive disease 11/23/2014  . Prolonged Q-T interval on ECG 11/04/2014  . OSA treated with BiPAP 09/01/2014  . IFG (impaired fasting glucose) 09/01/2014  . Severe obesity (BMI >= 40) ([place]) 01/29/2014  . BPH (benign prostatic hyperplasia) 01/28/2013  . Obesity 02/09/2012  . Hypertension 10/12/2011  . GERD (gastroesophageal reflux disease) 10/12/2011  . Hyperlipidemia 10/12/2011    Kerin Perna, PTA 02/08/2016 1:23 PM  [facility]  [place], [place], [address] Phone: [phone]   Fax:  [phone]  Name: Tracy Bennett MRN: [id number] Date of Birth: 05/17/1946

## 2016-02-11 ENCOUNTER — Ambulatory Visit (INDEPENDENT_AMBULATORY_CARE_PROVIDER_SITE_OTHER): Payer: Medicare Other | Admitting: Rehabilitative and Restorative Service Providers"

## 2016-02-11 ENCOUNTER — Encounter: Payer: Self-pay | Admitting: Rehabilitative and Restorative Service Providers"

## 2016-02-11 DIAGNOSIS — M79642 Pain in left hand: Secondary | ICD-10-CM | POA: Diagnosis not present

## 2016-02-11 DIAGNOSIS — M542 Cervicalgia: Secondary | ICD-10-CM

## 2016-02-11 DIAGNOSIS — R293 Abnormal posture: Secondary | ICD-10-CM

## 2016-02-11 DIAGNOSIS — R531 Weakness: Secondary | ICD-10-CM

## 2016-02-11 DIAGNOSIS — M2560 Stiffness of unspecified joint, not elsewhere classified: Secondary | ICD-10-CM | POA: Diagnosis not present

## 2016-02-11 NOTE — Patient Instructions (Signed)
Neurovascular: Median Nerve Stretch - Supine    Lie with neck supported, side-bent away from moving arm. Hold right arm out to side, elbow bent, thumb down, fingers and wrist bent back. Slowly straighten elbowas far as possible without pain. Hold for _1 minute ___ seconds. No pain! Repeat _2___ times per set. Do __2__ times a day.  Scar Tissue Massage    Place pad of fingertip on scar area. Apply steady downward pressure while moving in circular fashion. Use another fin-ger on top to assist. Repeat until entire scar has been covered. Repeat __5 min . Do __2__ sessions per day.

## 2016-02-11 NOTE — Therapy (Signed)
[place], [place], [address] Phone: [phone]   Fax:  [phone]  Physical Therapy Treatment  Patient Details  Name: Tracy Bennett MRN: [id number] Date of Birth: 07/27/1946 Referring Provider: Dr. Veatrice Kells  Encounter Date: 02/11/2016      PT End of Session - 02/11/16 0952    Visit Number 15   Number of Visits 20   Date for PT Re-Evaluation 02/23/16   PT Start Time 0933   PT Stop Time 1029   PT Time Calculation (min) 56 min   Activity Tolerance Patient tolerated treatment well      Past Medical History  Diagnosis Date  . Hypertension   . Hyperlipidemia   . Kidney stones     x 4   . GERD (gastroesophageal reflux disease)     Past Surgical History  Procedure Laterality Date  . Left knee surgery      ACL repair   . Umbilical hernia repair    . Carpal tunnel release      Right   . Cervical fusion  2000    C5-6  . Foot tendon surgery  11/14/2011    Dr. Sharol Given    There were no vitals filed for this visit.  Visit Diagnosis:  Abnormal posture  Generalized stiffness  Cervical pain (neck)  Weakness generalized  Pain of left hand      Subjective Assessment - 02/11/16 0947    Subjective patient reports that he feels that he is making progress. Got a chair massage this week which felt good. Making progress.    Currently in Pain? No/denies                         [facility] Adult PT Treatment/Exercise - 02/11/16 0001    Neck Exercises: Seated   Other Seated Exercise hand pull with postural work and head positioning for humming to improve vocal tone and range 4 trials varied vocalizations    Neck Exercises: Supine   Other Supine Exercise neural mobilization bilat UE's 1 min x 2 each side    Shoulder Exercises: Standing   Other Standing Exercises scap squeeze with nodle 10 sec x 10    Shoulder Exercises: ROM/Strengthening   UBE (Upper Arm Bike) L5 x 4' alt  FWD/BWD   Shoulder Exercises: Stretch   Other Shoulder Stretches doorway stretch , low and mid-level arms x 20 sec each; high level with unilateral arm x 20 sec    Other Shoulder Stretches scap retraction - pec stretch with noodle PT assist    Moist Heat Therapy   Number Minutes Moist Heat 15 Minutes   Moist Heat Location Cervical;Shoulder   Electrical Stimulation   Electrical Stimulation Location bilat upper traps/ cervical paraspinals   Electrical Stimulation Action IFC   Electrical Stimulation Parameters to tolerance   Electrical Stimulation Goals Pain;Tone   Manual Therapy   Manual Therapy Myofascial release;Soft tissue mobilization   Manual therapy comments pt supine    Soft tissue mobilization ant/lat/posterior cervical musculature, upper trap, levator, cervical paraspinals.     Myofascial Release suboccipital release, MFR to bilat upper trap and Rt scalene   Neural Stretch supine median nerve stretch 1 min x 2 each UE                 PT Education - 02/11/16 0952    Education provided Yes   Education Details cont work on posture and alignment at home  Person(s) Educated Patient   Methods Explanation;Demonstration;Tactile cues;Verbal cues   Comprehension Verbalized understanding;Returned demonstration;Verbal cues required;Tactile cues required             PT Long Term Goals - 01/26/16 1038    PT LONG TERM GOAL #1   Title I wth HEP ( 02/23/16)    Time 4   Period Weeks   Status On-going   PT LONG TERM GOAL #2   Title increase wrist ext/flexion =/> 75 degrees ( 01/19/16)    Status Achieved   PT LONG TERM GOAL #3   Title tolerate carrying/using his blower and yard equipment without difficulty ( 01/19/16)    Status Achieved   PT LONG TERM GOAL #4   Title increase strength in Lt wrist =/> 5-/5 ( 01/19/16)    Status Achieved   PT LONG TERM GOAL #5   Title improve Lt grip =/> 50# ( 01/19/16)    Status Achieved   PT LONG TERM GOAL #6   Title improve FOTO =/< 39%  limited,  ( 02/23/16)    Time 4   Period Weeks   Status On-going   PT LONG TERM GOAL #7   Title report =/> 75% improvement in reports of cervical stiffness. ( 02/23/16)    Time 4   Period Weeks   Status New   PT LONG TERM GOAL #8   Title report =/> 75% reduction in upper shoulder pain ( 02/23/16   Time 4   Period Weeks   Status New               Plan - 02/11/16 1026    Clinical Impression Statement Pt reports that he is improving. He is still concerned with his vocal performance with singing. Addressed this with activities that pt will work on at home. Progressing well toward stated goals of therapy.    Pt will benefit from skilled therapeutic intervention in order to improve on the following deficits Postural dysfunction;Decreased strength;Pain;Impaired UE functional use;Decreased range of motion   Rehab Potential Good   PT Frequency 2x / week   PT Duration 4 weeks   PT Treatment/Interventions Ultrasound;Neuromuscular re-education;Patient/family education;Passive range of motion;Dry needling;Electrical Stimulation;Cryotherapy;Moist Heat;Therapeutic exercise;Manual techniques;Vasopneumatic Device   PT Next Visit Plan STW/manual therapy to his neck and shoulders.  Postural correction ther ex.    PT Home Exercise Plan posture/alignment; HEP    Consulted and Agree with Plan of Care Patient        Problem List Patient Active Problem List   Diagnosis Date Noted  . Obesity, Class III, BMI 40-49.9 (morbid obesity) ([place]) 08/27/2015  . OAB (overactive bladder) 08/27/2015  . Mild diastolic dysfunction [id number]  . LVH (left ventricular hypertrophy) due to hypertensive disease 11/23/2014  . Prolonged Q-T interval on ECG 11/04/2014  . OSA treated with BiPAP 09/01/2014  . IFG (impaired fasting glucose) 09/01/2014  . Severe obesity (BMI >= 40) ([place]) 01/29/2014  . BPH (benign prostatic hyperplasia) 01/28/2013  . Obesity 02/09/2012  . Hypertension 10/12/2011  . GERD  (gastroesophageal reflux disease) 10/12/2011  . Hyperlipidemia 10/12/2011    Tracy Bennett PT, MPH  02/11/2016, 10:28 AM  [facility] [place], [place], [address] Phone: [phone]   Fax:  [phone]  Name: Tracy Bennett MRN: [id number] Date of Birth: [birth year]

## 2016-02-14 ENCOUNTER — Encounter: Payer: Self-pay | Admitting: Rehabilitative and Restorative Service Providers"

## 2016-02-14 ENCOUNTER — Ambulatory Visit (INDEPENDENT_AMBULATORY_CARE_PROVIDER_SITE_OTHER): Payer: Medicare Other | Admitting: Rehabilitative and Restorative Service Providers"

## 2016-02-14 DIAGNOSIS — R293 Abnormal posture: Secondary | ICD-10-CM | POA: Diagnosis not present

## 2016-02-14 DIAGNOSIS — R531 Weakness: Secondary | ICD-10-CM

## 2016-02-14 DIAGNOSIS — M79642 Pain in left hand: Secondary | ICD-10-CM | POA: Diagnosis not present

## 2016-02-14 DIAGNOSIS — M2560 Stiffness of unspecified joint, not elsewhere classified: Secondary | ICD-10-CM | POA: Diagnosis not present

## 2016-02-14 DIAGNOSIS — M542 Cervicalgia: Secondary | ICD-10-CM

## 2016-02-14 NOTE — Therapy (Signed)
[place], [place], [address] Phone: [phone]   Fax:  [phone]  Physical Therapy Treatment  Patient Details  Name: Tracy Bennett MRN: [id number] Date of Birth: Jun 20, 1946 Referring Provider: Dr. Joya Salm  Encounter Date: 02/14/2016      PT End of Session - 02/14/16 0941    Visit Number 16   Number of Visits 20   Date for PT Re-Evaluation 02/23/16   PT Start Time 0932   PT Stop Time 1028   PT Time Calculation (min) 56 min      Past Medical History  Diagnosis Date  . Hypertension   . Hyperlipidemia   . Kidney stones     x 4   . GERD (gastroesophageal reflux disease)     Past Surgical History  Procedure Laterality Date  . Left knee surgery      ACL repair   . Umbilical hernia repair    . Carpal tunnel release      Right   . Cervical fusion  2000    C5-6  . Foot tendon surgery  11/14/2011    Dr. Sharol Given    There were no vitals filed for this visit.  Visit Diagnosis:  Abnormal posture  Generalized stiffness  Cervical pain (neck)  Weakness generalized  Pain of left hand      Subjective Assessment - 02/14/16 0942    Subjective No significant change - no pain today    Currently in Pain? No/denies            [facility] PT Assessment - 02/14/16 0001    Assessment   Medical Diagnosis cervical stenosis, with surgery, Lt carpal tunnel release    Referring Provider Dr. Joya Salm   Onset Date/Surgical Date 10/25/16   Hand Dominance Right   Next MD Visit 03/01/16   AROM   Cervical Flexion 50   Cervical Extension 40   Cervical - Right Side Bend 35   Cervical - Left Side Bend 40   Cervical - Right Rotation 65   Cervical - Left Rotation 63   Strength   Overall Strength Comments bilat shoulders WNL                     [facility] Adult PT Treatment/Exercise - 02/14/16 0001    Neck Exercises: Seated   Other Seated Exercise hand pull with postural work and head  positioning for humming to improve vocal tone and range 4 trials varied vocalizations    Neck Exercises: Supine   Other Supine Exercise neural mobilization bilat UE's 1 min x 2 each side    Shoulder Exercises: Standing   Extension Both;Theraband;20 reps   Theraband Level (Shoulder Extension) Level 4 (Blue)   Row Strengthening;Both;20 reps;Theraband   Theraband Level (Shoulder Row) Level 4 (Blue)   Other Standing Exercises scap squeeze with nodle 10 sec x 10    Shoulder Exercises: ROM/Strengthening   UBE (Upper Arm Bike) L5 x 4' alt FWD/BWD   Shoulder Exercises: Stretch   Other Shoulder Stretches doorway stretch , low and mid-level arms x 20 sec each; high level with unilateral arm x 20 sec    Other Shoulder Stretches scap retraction - pec stretch with noodle PT assist    Shoulder Exercises: Body Blade   Flexion 60 seconds;2 reps   Moist Heat Therapy   Number Minutes Moist Heat 15 Minutes   Moist Heat Location Cervical;Shoulder   Electrical Stimulation   Electrical Stimulation Location bilat upper  traps/ cervical paraspinals   Electrical Stimulation Action IFC   Electrical Stimulation Parameters to tolerance   Electrical Stimulation Goals Pain;Tone   Manual Therapy   Manual Therapy Myofascial release;Soft tissue mobilization   Manual therapy comments pt supine    Soft tissue mobilization ant/lat/posterior cervical musculature, upper trap, levator, cervical paraspinals.     Myofascial Release suboccipital release, MFR to bilat upper trap and Rt scalene   Neural Stretch supine median nerve stretch 1 min x 2 each UE                 PT Education - 02/14/16 1021    Education provided Yes   Education Details HEP    Person(s) Educated Patient   Methods Explanation   Comprehension Verbalized understanding             PT Long Term Goals - 02/14/16 1024    PT LONG TERM GOAL #1   Title I wth HEP ( 02/23/16)    Time 4   Period Weeks   Status On-going   PT LONG TERM GOAL  #2   Title increase wrist ext/flexion =/> 75 degrees ( 01/19/16)    Time 4   Period Weeks   Status Achieved   PT LONG TERM GOAL #3   Title tolerate carrying/using his blower and yard equipment without difficulty ( 01/19/16)    Time 4   Period Weeks   Status Achieved   PT LONG TERM GOAL #4   Title increase strength in Lt wrist =/> 5-/5 ( 01/19/16)    Time 4   Period Weeks   Status Achieved   PT LONG TERM GOAL #5   Title improve Lt grip =/> 50# ( 01/19/16)    Time 4   Period Weeks   Status Achieved   PT LONG TERM GOAL #6   Title improve FOTO =/< 39% limited,  ( 02/23/16)    Time 4   Period Weeks   Status On-going   PT LONG TERM GOAL #7   Title report =/> 75% improvement in reports of cervical stiffness. ( 02/23/16)    Time 4   Period Weeks   Status On-going   PT LONG TERM GOAL #8   Title report =/> 75% reduction in upper shoulder pain ( 02/23/16   Time 4   Period Weeks   Status On-going               Plan - 02/14/16 1022    Clinical Impression Statement Patient reports that he is doing pretty good. Has some tightness and discomfort in his neck in the mornings - just feels stiff and tight. Feels the manual therapy helps. Progressing well with treatment. Patient considering continuing with I HEP after next appointments.    Pt will benefit from skilled therapeutic intervention in order to improve on the following deficits Postural dysfunction;Decreased strength;Pain;Impaired UE functional use;Decreased range of motion   Rehab Potential Good   PT Frequency 2x / week   PT Duration 4 weeks   PT Treatment/Interventions Ultrasound;Neuromuscular re-education;Patient/family education;Passive range of motion;Dry needling;Electrical Stimulation;Cryotherapy;Moist Heat;Therapeutic exercise;Manual techniques;Vasopneumatic Device   PT Next Visit Plan STW/manual therapy to his neck and shoulders.  Postural correction ther ex.    PT Home Exercise Plan posture/alignment; HEP    Consulted  and Agree with Plan of Care Patient        Problem List Patient Active Problem List   Diagnosis Date Noted  . Obesity, Class III, BMI 40-49.9 (morbid obesity) ([place])  08/27/2015  . OAB (overactive bladder) 08/27/2015  . Mild diastolic dysfunction [id number]  . LVH (left ventricular hypertrophy) due to hypertensive disease 11/23/2014  . Prolonged Q-T interval on ECG 11/04/2014  . OSA treated with BiPAP 09/01/2014  . IFG (impaired fasting glucose) 09/01/2014  . Severe obesity (BMI >= 40) ([place]) 01/29/2014  . BPH (benign prostatic hyperplasia) 01/28/2013  . Obesity 02/09/2012  . Hypertension 10/12/2011  . GERD (gastroesophageal reflux disease) 10/12/2011  . Hyperlipidemia 10/12/2011    Deette Revak Nilda Simmer PT, MPH  02/14/2016, 10:26 AM  [facility] [place], [place], [address] Phone: [phone]   Fax:  [phone]  Name: Tracy Bennett MRN: [id number] Date of Birth: Dec 29, 1945

## 2016-02-22 ENCOUNTER — Encounter: Payer: No Typology Code available for payment source | Admitting: Physical Therapy

## 2016-02-23 ENCOUNTER — Ambulatory Visit (INDEPENDENT_AMBULATORY_CARE_PROVIDER_SITE_OTHER): Payer: Medicare Other | Admitting: Rehabilitative and Restorative Service Providers"

## 2016-02-23 ENCOUNTER — Encounter: Payer: Self-pay | Admitting: Rehabilitative and Restorative Service Providers"

## 2016-02-23 DIAGNOSIS — M2560 Stiffness of unspecified joint, not elsewhere classified: Secondary | ICD-10-CM | POA: Diagnosis not present

## 2016-02-23 DIAGNOSIS — R293 Abnormal posture: Secondary | ICD-10-CM

## 2016-02-23 DIAGNOSIS — R531 Weakness: Secondary | ICD-10-CM

## 2016-02-23 DIAGNOSIS — M79642 Pain in left hand: Secondary | ICD-10-CM | POA: Diagnosis not present

## 2016-02-23 DIAGNOSIS — M542 Cervicalgia: Secondary | ICD-10-CM

## 2016-02-23 NOTE — Therapy (Signed)
[place], [place], [address] Phone: [phone]   Fax:  [phone]  Physical Therapy Treatment  Patient Details  Name: Tracy Bennett MRN: [id number] Date of Birth: 09/02/1946 Referring Provider: Dr. Joya Salm  Encounter Date: 02/23/2016      PT End of Session - 02/23/16 0938    Visit Number 17   Number of Visits 20   Date for PT Re-Evaluation 02/23/16   PT Start Time 0932   PT Stop Time 1027   PT Time Calculation (min) 55 min   Activity Tolerance Patient tolerated treatment well      Past Medical History  Diagnosis Date  . Hypertension   . Hyperlipidemia   . Kidney stones     x 4   . GERD (gastroesophageal reflux disease)     Past Surgical History  Procedure Laterality Date  . Left knee surgery      ACL repair   . Umbilical hernia repair    . Carpal tunnel release      Right   . Cervical fusion  2000    C5-6  . Foot tendon surgery  11/14/2011    Dr. Sharol Given    There were no vitals filed for this visit.  Visit Diagnosis:  Abnormal posture  Generalized stiffness  Cervical pain (neck)  Weakness generalized  Pain of left hand      Subjective Assessment - 02/23/16 0938    Subjective Doing OK - no real pain just still tight through the neck and shoulder area    Currently in Pain? No/denies            [facility] PT Assessment - 02/23/16 0001    Assessment   Medical Diagnosis cervical stenosis, with surgery, Lt carpal tunnel release    Referring Provider Dr. Joya Salm   Onset Date/Surgical Date 10/25/16   Hand Dominance Right   Next MD Visit 03/01/16   AROM   Cervical Flexion 55   Cervical Extension 42   Cervical - Right Side Bend 35   Cervical - Left Side Bend 34   Cervical - Right Rotation 65   Cervical - Left Rotation 64                     [facility] Adult PT Treatment/Exercise - 02/23/16 0001    Neck Exercises: Seated   Other Seated Exercise hand pull(added  push) with postural work and head positioning for humming to improve vocal tone and range 4 trials varied vocalizations    Neck Exercises: Supine   Other Supine Exercise neural mobilization bilat UE's 1 min x 2 each side    Other Supine Exercise lying on swim noodle in snow angle position 2 min    Shoulder Exercises: Standing   Other Standing Exercises scap squeeze with nodle 10 sec x 10    Shoulder Exercises: ROM/Strengthening   UBE (Upper Arm Bike) L5 x 4' alt FWD/BWD   Shoulder Exercises: Stretch   Other Shoulder Stretches doorway stretch , low and mid-level arms x 20 sec each; high level with unilateral arm x 20 sec    Other Shoulder Stretches scap retraction - pec stretch with noodle PT assist    Moist Heat Therapy   Number Minutes Moist Heat 15 Minutes   Moist Heat Location Cervical;Shoulder   Electrical Stimulation   Electrical Stimulation Location bilat upper traps/ cervical paraspinals   Electrical Stimulation Action IFC   Electrical Stimulation Parameters to tolerance  Electrical Stimulation Goals Pain;Tone   Manual Therapy   Manual Therapy Myofascial release;Soft tissue mobilization   Manual therapy comments pt supine    Soft tissue mobilization ant/lat/posterior cervical musculature, upper trap, levator, cervical paraspinals.     Myofascial Release suboccipital release, MFR to bilat upper trap and Rt scalene   Neural Stretch supine median nerve stretch 1 min x 2 each UE                 PT Education - 02/23/16 1014    Education provided Yes   Education Details HEP importance of consistent HEP    Person(s) Educated Patient   Methods Explanation   Comprehension Verbalized understanding             PT Long Term Goals - 02/23/16 1015    PT LONG TERM GOAL #1   Title I wth HEP ( 02/23/16)    Time 4   Period Weeks   Status Achieved   PT LONG TERM GOAL #2   Title increase wrist ext/flexion =/> 75 degrees ( 01/19/16)    Time 4   Period Weeks   Status  Achieved   PT LONG TERM GOAL #3   Title tolerate carrying/using his blower and yard equipment without difficulty ( 01/19/16)    Time 4   Status Achieved   PT LONG TERM GOAL #4   Title increase strength in Lt wrist =/> 5-/5 ( 01/19/16)    Time 4   Period Weeks   Status Achieved   PT LONG TERM GOAL #5   Title improve Lt grip =/> 50# ( 01/19/16)    Time 4   Period Weeks   Status Achieved   PT LONG TERM GOAL #6   Title improve FOTO =/< 39% limited,  ( 02/23/16)    Time 4   Period Weeks   PT LONG TERM GOAL #7   Title report =/> 75% improvement in reports of cervical stiffness. ( 02/23/16)    Time 4   Period Weeks   Status Achieved   PT LONG TERM GOAL #8   Title report =/> 75% reduction in upper shoulder pain ( 02/23/16   Time 4   Period Weeks   Status Achieved               Problem List Patient Active Problem List   Diagnosis Date Noted  . Obesity, Class III, BMI 40-49.9 (morbid obesity) ([place]) 08/27/2015  . OAB (overactive bladder) 08/27/2015  . Mild diastolic dysfunction 56/31/4970  . LVH (left ventricular hypertrophy) due to hypertensive disease 11/23/2014  . Prolonged Q-T interval on ECG 11/04/2014  . OSA treated with BiPAP 09/01/2014  . IFG (impaired fasting glucose) 09/01/2014  . Severe obesity (BMI >= 40) ([place]) 01/29/2014  . BPH (benign prostatic hyperplasia) 01/28/2013  . Obesity 02/09/2012  . Hypertension 10/12/2011  . GERD (gastroesophageal reflux disease) 10/12/2011  . Hyperlipidemia 10/12/2011    Deniese Oberry Nilda Simmer PT, MPH  02/23/2016, 10:17 AM  [facility] [place] [employer], [place], [address] Phone: [phone]   Fax:  [phone]  Name: Tracy Bennett MRN: [id number] Date of Birth: 1946/03/29    PHYSICAL THERAPY DISCHARGE SUMMARY  Visits from Start of Care: 17  Current functional level related to goals / functional outcomes: Good AROM through Lt wrist/hand and cervical spine;  decreased tightness; no pain; returned to normal functional activity level   Remaining deficits: Tightness deep cervical musculature    Education / Equipment:  HEP  Plan: Patient agrees to discharge.  Patient goals were met. Patient is being discharged due to meeting the stated rehab goals.  ?????    Stepfon Rawles P. Helene Kelp PT, MPH 02/23/2016 11:57 AM

## 2016-02-25 ENCOUNTER — Encounter: Payer: No Typology Code available for payment source | Admitting: Physical Therapy

## 2016-03-09 ENCOUNTER — Other Ambulatory Visit: Payer: Self-pay | Admitting: Family Medicine

## 2016-03-15 ENCOUNTER — Other Ambulatory Visit: Payer: Self-pay | Admitting: Family Medicine

## 2016-03-15 DIAGNOSIS — D485 Neoplasm of uncertain behavior of skin: Secondary | ICD-10-CM | POA: Diagnosis not present

## 2016-03-15 DIAGNOSIS — L570 Actinic keratosis: Secondary | ICD-10-CM | POA: Diagnosis not present

## 2016-03-15 DIAGNOSIS — D045 Carcinoma in situ of skin of trunk: Secondary | ICD-10-CM | POA: Diagnosis not present

## 2016-03-15 DIAGNOSIS — R21 Rash and other nonspecific skin eruption: Secondary | ICD-10-CM | POA: Diagnosis not present

## 2016-03-23 DIAGNOSIS — Z6841 Body Mass Index (BMI) 40.0 and over, adult: Secondary | ICD-10-CM | POA: Diagnosis not present

## 2016-03-23 DIAGNOSIS — M4802 Spinal stenosis, cervical region: Secondary | ICD-10-CM | POA: Diagnosis not present

## 2016-03-28 ENCOUNTER — Encounter: Payer: Self-pay | Admitting: Family Medicine

## 2016-03-28 ENCOUNTER — Ambulatory Visit (INDEPENDENT_AMBULATORY_CARE_PROVIDER_SITE_OTHER): Payer: Medicare Other | Admitting: Family Medicine

## 2016-03-28 VITALS — BP 139/67 | HR 66 | Wt 311.0 lb

## 2016-03-28 DIAGNOSIS — M545 Low back pain, unspecified: Secondary | ICD-10-CM

## 2016-03-28 DIAGNOSIS — R7301 Impaired fasting glucose: Secondary | ICD-10-CM

## 2016-03-28 DIAGNOSIS — J0100 Acute maxillary sinusitis, unspecified: Secondary | ICD-10-CM

## 2016-03-28 DIAGNOSIS — I10 Essential (primary) hypertension: Secondary | ICD-10-CM | POA: Diagnosis not present

## 2016-03-28 DIAGNOSIS — J209 Acute bronchitis, unspecified: Secondary | ICD-10-CM

## 2016-03-28 LAB — POCT GLYCOSYLATED HEMOGLOBIN (HGB A1C): HEMOGLOBIN A1C: 5.9

## 2016-03-28 LAB — BASIC METABOLIC PANEL
BUN: 17 mg/dL (ref 7–25)
CALCIUM: 9.2 mg/dL (ref 8.6–10.3)
CO2: 28 mmol/L (ref 20–31)
Chloride: 104 mmol/L (ref 98–110)
Creat: 1.29 mg/dL — ABNORMAL HIGH (ref 0.70–1.25)
Glucose, Bld: 104 mg/dL — ABNORMAL HIGH (ref 65–99)
POTASSIUM: 4.4 mmol/L (ref 3.5–5.3)
SODIUM: 140 mmol/L (ref 135–146)

## 2016-03-28 MED ORDER — PREDNISONE 20 MG PO TABS: 40.0000 mg | ORAL_TABLET | Freq: Every day | ORAL | Status: DC

## 2016-03-28 MED ORDER — AZITHROMYCIN 250 MG PO TABS: ORAL_TABLET | ORAL | Status: AC

## 2016-03-28 MED ORDER — TIZANIDINE HCL 4 MG PO TABS
4.0000 mg | ORAL_TABLET | Freq: Three times a day (TID) | ORAL | Status: DC | PRN
Start: 2016-03-28 — End: 2017-11-12

## 2016-03-28 MED ORDER — HYDROCODONE-HOMATROPINE 5-1.5 MG/5ML PO SYRP: 5.0000 mL | ORAL_SOLUTION | Freq: Every evening | ORAL | Status: DC | PRN

## 2016-03-28 NOTE — Addendum Note (Signed)
Addended by: Beatrice Lecher D on: 03/28/2016 10:29 AM   Modules accepted: Orders, SmartSet

## 2016-03-28 NOTE — Patient Instructions (Signed)

## 2016-03-28 NOTE — Progress Notes (Signed)
Subjective:    Patient ID: Tracy Bennett, male    DOB: August 08, 1946, 70 y.o.   MRN: [id number]  HPI Hypertension- Pt denies chest pain, SOB, dizziness, or heart palpitations.  Taking meds as directed w/o problems.  Denies medication side effects.    IFG - No increased thirst or urination.  Patient complains of sinus congestion and pressure and sore throat for greater than one week. He says this started with sinus symptoms and now he is also developed a cough. The cough is productive now. He denies any shortness of breath or wheezing. He also has a persistently runny nose. He says it's been very difficult to wear his BiPAP at night cousin this. He complains of facial pressure under both eyes bilaterally. He did have one day where he felt like he had some chills and sweats but no objective temperature. He is not currently taking any cough medications that he is taking Claritin daily.  He also complains of right-sided low back pain that's been going on for the last couple of weeks. He doesn't number any injury or trauma per se but has a prior history of osteoporosis of the spine and actually recently had cervical spine surgery. He denies any numbness or tingling of the extremities. He has not been doing any specific treatment for it.  Review of Systems  BP 139/67 mmHg  Pulse 66  Wt 311 lb (141.069 kg)  SpO2 98%    Allergies  Allergen Reactions  . Amoxicillin Swelling    Tongue sores  . Contrast Media [Iodinated Diagnostic Agents] Shortness Of Breath  . Lisinopril Cough    Past Medical History  Diagnosis Date  . Hypertension   . Hyperlipidemia   . Kidney stones     x 4   . GERD (gastroesophageal reflux disease)     Past Surgical History  Procedure Laterality Date  . Left knee surgery      ACL repair   . Umbilical hernia repair    . Carpal tunnel release      Right   . Cervical fusion  2000    C5-6  . Foot tendon surgery  11/14/2011    Dr. Sharol Given    Social History    Social History  . Marital Status: Married    Spouse Name: Kalman Shan   . Number of Children: 1  . Years of Education: N/A   Occupational History  . Retired      [employer]    Social History Main Topics  . Smoking status: Former Smoker    Quit date: 02/09/1980  . Smokeless tobacco: Not on file  . Alcohol Use: Yes  . Drug Use: No  . Sexual Activity: Yes   Other Topics Concern  . Not on file   Social History Narrative   Golf about 2 times per week.  No regular exercise.     Family History  Problem Relation Age of Onset  . COPD Mother   . Hypertension Mother   . Emphysema Mother     Outpatient Encounter Prescriptions as of 03/28/2016  Medication Sig  . finasteride (PROSCAR) 5 MG tablet Take 1 tablet (5 mg total) by mouth daily.  . pantoprazole (PROTONIX) 40 MG tablet   . Probiotic Product (ALIGN PO) Take 1 tablet by mouth daily.   . simvastatin (ZOCOR) 40 MG tablet Take 1 tablet (40 mg total) by mouth daily at 6 PM.  . Tamsulosin HCl (FLOMAX) 0.4 MG CAPS Take 0.4 mg by mouth daily.   Marland Kitchen  valsartan-hydrochlorothiazide (DIOVAN-HCT) 80-12.5 MG tablet Take 1 tablet by mouth daily. *ADDITIONAL REFILLS REQUIRE AN APPOINTMENT. PLEASE CALL THE OFFICE TO SCHEDULE.*  . predniSONE (DELTASONE) 20 MG tablet Take 2 tablets (40 mg total) by mouth daily.  Marland Kitchen tiZANidine (ZANAFLEX) 4 MG tablet Take 1 tablet (4 mg total) by mouth every 8 (eight) hours as needed.  . [DISCONTINUED] diazepam (VALIUM) 5 MG tablet Take 0.5-1 tablets (2.5-5 mg total) by mouth every 8 (eight) hours as needed for anxiety.  . [DISCONTINUED] sildenafil (REVATIO) 20 MG tablet Take 100 mg by mouth. Reported on 12/15/2015  . [DISCONTINUED] solifenacin (VESICARE) 5 MG tablet Take 5 mg by mouth. Reported on 12/15/2015  . [DISCONTINUED] traMADol (ULTRAM) 50 MG tablet Take 1 tablet (50 mg total) by mouth every 8 (eight) hours as needed.   No facility-administered encounter medications on file as of 03/28/2016.           Objective:   Physical Exam  Constitutional: He is oriented to person, place, and time. He appears well-developed and well-nourished.  HENT:  Head: Normocephalic and atraumatic.  Right Ear: External ear normal.  Left Ear: External ear normal.  Nose: Nose normal.  Mouth/Throat: Oropharynx is clear and moist.  TMs and canals are clear.   Eyes: Conjunctivae and EOM are normal. Pupils are equal, round, and reactive to light.  Neck: Neck supple. No thyromegaly present.  Cardiovascular: Normal rate and normal heart sounds.   Pulmonary/Chest: Effort normal and breath sounds normal.  Musculoskeletal:  nontender over the lumbar spine or SI joints. Negative straight leg raise bilaterally. Hip, knee, ankle strength is 5 out of 5 bilaterally. Normal lumbar flexion and extension and rotation right and left. He had more pain with extension of the lumbar spine.  Lymphadenopathy:    He has no cervical adenopathy.  Neurological: He is alert and oriented to person, place, and time.  Skin: Skin is warm and dry.  Psychiatric: He has a normal mood and affect.          Assessment & Plan:  HTN- Well controlled. Continue current regimen. Follow up in 6 mo  IFG - stable. Continue current regimen. Follow up in 6 months. Lab Results  Component Value Date   HGBA1C 5.9 03/28/2016    Sinusitis/bronchitis-the lung exam is clear today. We'll go ahead and treat with azithromycin because of penicillin allergy. He also requested a bottle of cough syrup for bedtime since the cough has been keeping him awake at night.  Right low back pain-most consistent with muscular skeletal strain-given handout on exercises to do on his own at home since he just completed physical therapy for his carpal tunnel in his neck. We'll give 5 day prednisone burst as well as a muscle relaxer to use as needed. Next

## 2016-03-29 ENCOUNTER — Encounter: Payer: Self-pay | Admitting: Family Medicine

## 2016-03-29 DIAGNOSIS — N183 Chronic kidney disease, stage 3 unspecified: Secondary | ICD-10-CM | POA: Insufficient documentation

## 2016-03-29 DIAGNOSIS — N1832 Chronic kidney disease, stage 3b: Secondary | ICD-10-CM | POA: Insufficient documentation

## 2016-04-03 DIAGNOSIS — M9903 Segmental and somatic dysfunction of lumbar region: Secondary | ICD-10-CM | POA: Diagnosis not present

## 2016-04-03 DIAGNOSIS — M47816 Spondylosis without myelopathy or radiculopathy, lumbar region: Secondary | ICD-10-CM | POA: Diagnosis not present

## 2016-04-03 DIAGNOSIS — M9904 Segmental and somatic dysfunction of sacral region: Secondary | ICD-10-CM | POA: Diagnosis not present

## 2016-04-03 DIAGNOSIS — M791 Myalgia: Secondary | ICD-10-CM | POA: Diagnosis not present

## 2016-04-07 DIAGNOSIS — M47816 Spondylosis without myelopathy or radiculopathy, lumbar region: Secondary | ICD-10-CM | POA: Diagnosis not present

## 2016-04-07 DIAGNOSIS — M791 Myalgia: Secondary | ICD-10-CM | POA: Diagnosis not present

## 2016-04-07 DIAGNOSIS — M9904 Segmental and somatic dysfunction of sacral region: Secondary | ICD-10-CM | POA: Diagnosis not present

## 2016-04-07 DIAGNOSIS — M9903 Segmental and somatic dysfunction of lumbar region: Secondary | ICD-10-CM | POA: Diagnosis not present

## 2016-04-09 ENCOUNTER — Other Ambulatory Visit: Payer: Self-pay | Admitting: Family Medicine

## 2016-04-10 DIAGNOSIS — M791 Myalgia: Secondary | ICD-10-CM | POA: Diagnosis not present

## 2016-04-10 DIAGNOSIS — M9903 Segmental and somatic dysfunction of lumbar region: Secondary | ICD-10-CM | POA: Diagnosis not present

## 2016-04-10 DIAGNOSIS — M47816 Spondylosis without myelopathy or radiculopathy, lumbar region: Secondary | ICD-10-CM | POA: Diagnosis not present

## 2016-04-10 DIAGNOSIS — M9904 Segmental and somatic dysfunction of sacral region: Secondary | ICD-10-CM | POA: Diagnosis not present

## 2016-04-13 DIAGNOSIS — C44529 Squamous cell carcinoma of skin of other part of trunk: Secondary | ICD-10-CM | POA: Diagnosis not present

## 2016-04-14 DIAGNOSIS — M9903 Segmental and somatic dysfunction of lumbar region: Secondary | ICD-10-CM | POA: Diagnosis not present

## 2016-04-14 DIAGNOSIS — M47816 Spondylosis without myelopathy or radiculopathy, lumbar region: Secondary | ICD-10-CM | POA: Diagnosis not present

## 2016-04-14 DIAGNOSIS — M9904 Segmental and somatic dysfunction of sacral region: Secondary | ICD-10-CM | POA: Diagnosis not present

## 2016-04-14 DIAGNOSIS — M791 Myalgia: Secondary | ICD-10-CM | POA: Diagnosis not present

## 2016-04-17 DIAGNOSIS — M9904 Segmental and somatic dysfunction of sacral region: Secondary | ICD-10-CM | POA: Diagnosis not present

## 2016-04-17 DIAGNOSIS — M791 Myalgia: Secondary | ICD-10-CM | POA: Diagnosis not present

## 2016-04-17 DIAGNOSIS — M9903 Segmental and somatic dysfunction of lumbar region: Secondary | ICD-10-CM | POA: Diagnosis not present

## 2016-04-17 DIAGNOSIS — M47816 Spondylosis without myelopathy or radiculopathy, lumbar region: Secondary | ICD-10-CM | POA: Diagnosis not present

## 2016-06-08 ENCOUNTER — Other Ambulatory Visit: Payer: Self-pay | Admitting: Family Medicine

## 2016-06-15 DIAGNOSIS — H6063 Unspecified chronic otitis externa, bilateral: Secondary | ICD-10-CM | POA: Diagnosis not present

## 2016-07-04 DIAGNOSIS — H02051 Trichiasis without entropian right upper eyelid: Secondary | ICD-10-CM | POA: Diagnosis not present

## 2016-07-04 DIAGNOSIS — Z79899 Other long term (current) drug therapy: Secondary | ICD-10-CM | POA: Diagnosis not present

## 2016-07-04 DIAGNOSIS — Z961 Presence of intraocular lens: Secondary | ICD-10-CM | POA: Diagnosis not present

## 2016-07-04 DIAGNOSIS — H02054 Trichiasis without entropian left upper eyelid: Secondary | ICD-10-CM | POA: Diagnosis not present

## 2016-07-04 DIAGNOSIS — H04123 Dry eye syndrome of bilateral lacrimal glands: Secondary | ICD-10-CM | POA: Diagnosis not present

## 2016-07-04 DIAGNOSIS — L908 Other atrophic disorders of skin: Secondary | ICD-10-CM | POA: Diagnosis not present

## 2016-08-09 DIAGNOSIS — H02056 Trichiasis without entropian left eye, unspecified eyelid: Secondary | ICD-10-CM | POA: Diagnosis not present

## 2016-08-09 DIAGNOSIS — H02831 Dermatochalasis of right upper eyelid: Secondary | ICD-10-CM | POA: Diagnosis not present

## 2016-08-09 DIAGNOSIS — H02053 Trichiasis without entropian right eye, unspecified eyelid: Secondary | ICD-10-CM | POA: Diagnosis not present

## 2016-08-09 DIAGNOSIS — Z961 Presence of intraocular lens: Secondary | ICD-10-CM | POA: Diagnosis not present

## 2016-08-09 DIAGNOSIS — H01022 Squamous blepharitis right lower eyelid: Secondary | ICD-10-CM | POA: Diagnosis not present

## 2016-08-09 DIAGNOSIS — H01024 Squamous blepharitis left upper eyelid: Secondary | ICD-10-CM | POA: Diagnosis not present

## 2016-08-09 DIAGNOSIS — Z888 Allergy status to other drugs, medicaments and biological substances status: Secondary | ICD-10-CM | POA: Diagnosis not present

## 2016-08-09 DIAGNOSIS — Z9889 Other specified postprocedural states: Secondary | ICD-10-CM | POA: Diagnosis not present

## 2016-08-09 DIAGNOSIS — H01021 Squamous blepharitis right upper eyelid: Secondary | ICD-10-CM | POA: Diagnosis not present

## 2016-08-09 DIAGNOSIS — Z9842 Cataract extraction status, left eye: Secondary | ICD-10-CM | POA: Diagnosis not present

## 2016-08-09 DIAGNOSIS — H02834 Dermatochalasis of left upper eyelid: Secondary | ICD-10-CM | POA: Diagnosis not present

## 2016-08-09 DIAGNOSIS — H01025 Squamous blepharitis left lower eyelid: Secondary | ICD-10-CM | POA: Diagnosis not present

## 2016-08-09 DIAGNOSIS — H02403 Unspecified ptosis of bilateral eyelids: Secondary | ICD-10-CM | POA: Diagnosis not present

## 2016-08-09 DIAGNOSIS — Z9849 Cataract extraction status, unspecified eye: Secondary | ICD-10-CM | POA: Diagnosis not present

## 2016-08-09 DIAGNOSIS — Z880 Allergy status to penicillin: Secondary | ICD-10-CM | POA: Diagnosis not present

## 2016-08-09 DIAGNOSIS — Z9841 Cataract extraction status, right eye: Secondary | ICD-10-CM | POA: Diagnosis not present

## 2016-08-15 DIAGNOSIS — Z08 Encounter for follow-up examination after completed treatment for malignant neoplasm: Secondary | ICD-10-CM | POA: Diagnosis not present

## 2016-08-15 DIAGNOSIS — Z85828 Personal history of other malignant neoplasm of skin: Secondary | ICD-10-CM | POA: Diagnosis not present

## 2016-08-15 DIAGNOSIS — L570 Actinic keratosis: Secondary | ICD-10-CM | POA: Diagnosis not present

## 2016-08-22 DIAGNOSIS — B369 Superficial mycosis, unspecified: Secondary | ICD-10-CM | POA: Diagnosis not present

## 2016-08-30 DIAGNOSIS — B369 Superficial mycosis, unspecified: Secondary | ICD-10-CM | POA: Diagnosis not present

## 2016-09-01 DIAGNOSIS — I10 Essential (primary) hypertension: Secondary | ICD-10-CM | POA: Diagnosis not present

## 2016-09-01 DIAGNOSIS — H02403 Unspecified ptosis of bilateral eyelids: Secondary | ICD-10-CM | POA: Diagnosis not present

## 2016-09-01 DIAGNOSIS — G4733 Obstructive sleep apnea (adult) (pediatric): Secondary | ICD-10-CM | POA: Diagnosis not present

## 2016-09-01 DIAGNOSIS — Z9989 Dependence on other enabling machines and devices: Secondary | ICD-10-CM | POA: Diagnosis not present

## 2016-09-01 DIAGNOSIS — Z888 Allergy status to other drugs, medicaments and biological substances status: Secondary | ICD-10-CM | POA: Diagnosis not present

## 2016-09-01 DIAGNOSIS — Z961 Presence of intraocular lens: Secondary | ICD-10-CM | POA: Diagnosis not present

## 2016-09-01 DIAGNOSIS — H02834 Dermatochalasis of left upper eyelid: Secondary | ICD-10-CM | POA: Diagnosis not present

## 2016-09-01 DIAGNOSIS — Z87891 Personal history of nicotine dependence: Secondary | ICD-10-CM | POA: Diagnosis not present

## 2016-09-01 DIAGNOSIS — Z91041 Radiographic dye allergy status: Secondary | ICD-10-CM | POA: Diagnosis not present

## 2016-09-01 DIAGNOSIS — H0259 Other disorders affecting eyelid function: Secondary | ICD-10-CM | POA: Diagnosis not present

## 2016-09-01 DIAGNOSIS — H02831 Dermatochalasis of right upper eyelid: Secondary | ICD-10-CM | POA: Diagnosis not present

## 2016-09-01 DIAGNOSIS — L908 Other atrophic disorders of skin: Secondary | ICD-10-CM | POA: Diagnosis not present

## 2016-09-01 DIAGNOSIS — Z9841 Cataract extraction status, right eye: Secondary | ICD-10-CM | POA: Diagnosis not present

## 2016-09-01 DIAGNOSIS — E785 Hyperlipidemia, unspecified: Secondary | ICD-10-CM | POA: Diagnosis not present

## 2016-09-01 DIAGNOSIS — Z9842 Cataract extraction status, left eye: Secondary | ICD-10-CM | POA: Diagnosis not present

## 2016-09-01 DIAGNOSIS — Z880 Allergy status to penicillin: Secondary | ICD-10-CM | POA: Diagnosis not present

## 2016-09-01 DIAGNOSIS — Z79899 Other long term (current) drug therapy: Secondary | ICD-10-CM | POA: Diagnosis not present

## 2016-09-27 ENCOUNTER — Encounter: Payer: Self-pay | Admitting: Internal Medicine

## 2016-09-27 ENCOUNTER — Ambulatory Visit (INDEPENDENT_AMBULATORY_CARE_PROVIDER_SITE_OTHER): Payer: Medicare Other | Admitting: Internal Medicine

## 2016-09-27 VITALS — BP 160/82 | HR 67 | Ht 70.0 in | Wt 323.0 lb

## 2016-09-27 DIAGNOSIS — R002 Palpitations: Secondary | ICD-10-CM | POA: Diagnosis not present

## 2016-09-27 DIAGNOSIS — I493 Ventricular premature depolarization: Secondary | ICD-10-CM | POA: Diagnosis not present

## 2016-09-27 NOTE — Progress Notes (Signed)
Patient Care Team: Hali Marry, MD as PCP - General (Family Medicine) Sonnie Alamo, MD as Referring Physician (Gastroenterology) Bethann Punches, MD as Referring Physician (Urology) Deboraha Sprang, MD as Consulting Physician (Cardiology) Magnus Sinning, MD as Consulting Physician   HPI  Tracy Bennett is a 70 y.o. male Seen in follow-up for PVCs of review from the right ventricular outflow tract. He had no associated symptoms.  There also been a question at the initial consultation regarding long QT syndrome for which I found no evidence.  Blood pressures at home run 140-45. He does have some edema. There has been less exercise off following cervical neck surgery last fall. His diet is not associated with added salt but he eats out a lot.  Records and Results Reviewed  11/16 echocardiogram EF normal  Past Medical History:  Diagnosis Date  . GERD (gastroesophageal reflux disease)   . Hyperlipidemia   . Hypertension   . Kidney stones    x 4     Past Surgical History:  Procedure Laterality Date  . CARPAL TUNNEL RELEASE     Right   . CERVICAL FUSION  2000   C5-6  . FOOT TENDON SURGERY  11/14/2011   Dr. Sharol Given  . left knee surgery     ACL repair   . UMBILICAL HERNIA REPAIR      Current Outpatient Prescriptions  Medication Sig Dispense Refill  . finasteride (PROSCAR) 5 MG tablet Take 1 tablet (5 mg total) by mouth daily. 30 tablet 1  . HYDROcodone-homatropine (HYCODAN) 5-1.5 MG/5ML syrup Take 5 mLs by mouth at bedtime as needed for cough. 120 mL 0  . pantoprazole (PROTONIX) 40 MG tablet Take 40 mg by mouth daily.     . simvastatin (ZOCOR) 40 MG tablet TAKE 1 TABLET (40 MG TOTAL) BY MOUTH DAILY AT 6 PM. 90 tablet 1  . Tamsulosin HCl (FLOMAX) 0.4 MG CAPS Take 0.4 mg by mouth daily.     Marland Kitchen tiZANidine (ZANAFLEX) 4 MG tablet Take 1 tablet (4 mg total) by mouth every 8 (eight) hours as needed. 30 tablet 0  . valsartan-hydrochlorothiazide (DIOVAN-HCT) 80-12.5 MG  tablet TAKE 1 TABLET BY MOUTH EVERY DAY 90 tablet 1   No current facility-administered medications for this visit.     Allergies  Allergen Reactions  . Amoxicillin Swelling    Tongue sores  . Contrast Media [Iodinated Diagnostic Agents] Shortness Of Breath  . Lisinopril Cough      Review of Systems negative except from HPI and PMH  Physical Exam BP (!) 160/82   Pulse 67   Ht 5\' 10"  (1.778 m)   Wt (!) 323 lb (146.5 kg)   SpO2 97%   BMI 46.35 kg/m  Well developed and well nourished in no acute distress HENT normal E scleral and icterus clear Neck Supple JVP flat; carotids brisk and full Clear to ausculation  Regular rate and rhythm, no murmurs gallops or rub Soft with active bowel sounds No clubbing cyanosis 2+ Edema Alert and oriented, grossly normal motor and sensory function Skin Warm and Dry  ECG demonstrates sinus rhythm at 69 Intervals 14/08/40 PVCs with a left bundle inferior axis morphology transitioning and V3 and one couplet  Assessment and  Plan  PVCs-RVOT  Hypertension   He has no treatable symptoms to his PVCs. We will look at LV function to make sure there is no impact on contractility  His blood pressure is poorly controlled. It runs 140+ at  home. However, with his history of nephrolithiasis I am most increased diuretics. Apparently last year when we try to increase his valsartan/HCT he ended up with hypotension. We will try a nonmedicinal approach initially by trying to have him decrease his sodium intake. He is volume intake is quite high because of the kidney stones.      Current medicines are reviewed at length with the patient today .  The patient does not  have concerns regarding medicines.

## 2016-09-27 NOTE — Patient Instructions (Signed)
Medication Instructions: - Your physician recommends that you continue on your current medications as directed. Please refer to the Current Medication list given to you today.  Labwork: - none ordered  Procedures/Testing: - Your physician has requested that you have an echocardiogram. Echocardiography is a painless test that uses sound waves to create images of your heart. It provides your doctor with information about the size and shape of your heart and how well your heart's chambers and valves are working. This procedure takes approximately one hour. There are no restrictions for this procedure.  Follow-Up: - Your physician wants you to follow-up in: 1 year with Dr. Klein. You will receive a reminder letter in the mail two months in advance. If you don't receive a letter, please call our office to schedule the follow-up appointment.   Any Additional Special Instructions Will Be Listed Below (If Applicable).     If you need a refill on your cardiac medications before your next appointment, please call your pharmacy.   

## 2016-09-28 DIAGNOSIS — B369 Superficial mycosis, unspecified: Secondary | ICD-10-CM | POA: Diagnosis not present

## 2016-10-02 DIAGNOSIS — H57819 Brow ptosis, unspecified: Secondary | ICD-10-CM | POA: Insufficient documentation

## 2016-10-02 DIAGNOSIS — H02831 Dermatochalasis of right upper eyelid: Secondary | ICD-10-CM | POA: Insufficient documentation

## 2016-10-02 DIAGNOSIS — H02834 Dermatochalasis of left upper eyelid: Secondary | ICD-10-CM | POA: Insufficient documentation

## 2016-10-13 ENCOUNTER — Other Ambulatory Visit: Payer: Self-pay | Admitting: Family Medicine

## 2016-10-17 ENCOUNTER — Ambulatory Visit (HOSPITAL_COMMUNITY): Payer: Medicare Other | Attending: Cardiovascular Disease

## 2016-10-17 ENCOUNTER — Other Ambulatory Visit: Payer: Self-pay

## 2016-10-17 DIAGNOSIS — I313 Pericardial effusion (noninflammatory): Secondary | ICD-10-CM | POA: Diagnosis not present

## 2016-10-17 DIAGNOSIS — I493 Ventricular premature depolarization: Secondary | ICD-10-CM | POA: Insufficient documentation

## 2016-10-18 DIAGNOSIS — Z23 Encounter for immunization: Secondary | ICD-10-CM | POA: Diagnosis not present

## 2016-10-25 DIAGNOSIS — N401 Enlarged prostate with lower urinary tract symptoms: Secondary | ICD-10-CM | POA: Diagnosis not present

## 2016-10-25 DIAGNOSIS — K5900 Constipation, unspecified: Secondary | ICD-10-CM | POA: Diagnosis not present

## 2016-10-25 DIAGNOSIS — N209 Urinary calculus, unspecified: Secondary | ICD-10-CM | POA: Diagnosis not present

## 2016-10-25 DIAGNOSIS — N200 Calculus of kidney: Secondary | ICD-10-CM | POA: Diagnosis not present

## 2016-10-25 DIAGNOSIS — N3943 Post-void dribbling: Secondary | ICD-10-CM | POA: Diagnosis not present

## 2016-10-25 DIAGNOSIS — I10 Essential (primary) hypertension: Secondary | ICD-10-CM | POA: Diagnosis not present

## 2016-12-07 DIAGNOSIS — Z880 Allergy status to penicillin: Secondary | ICD-10-CM | POA: Diagnosis not present

## 2016-12-07 DIAGNOSIS — Z01818 Encounter for other preprocedural examination: Secondary | ICD-10-CM | POA: Diagnosis not present

## 2016-12-07 DIAGNOSIS — N189 Chronic kidney disease, unspecified: Secondary | ICD-10-CM | POA: Diagnosis not present

## 2016-12-07 DIAGNOSIS — Z888 Allergy status to other drugs, medicaments and biological substances status: Secondary | ICD-10-CM | POA: Diagnosis not present

## 2016-12-07 DIAGNOSIS — H02409 Unspecified ptosis of unspecified eyelid: Secondary | ICD-10-CM | POA: Diagnosis not present

## 2016-12-07 DIAGNOSIS — H02831 Dermatochalasis of right upper eyelid: Secondary | ICD-10-CM | POA: Diagnosis not present

## 2016-12-07 DIAGNOSIS — H02834 Dermatochalasis of left upper eyelid: Secondary | ICD-10-CM | POA: Diagnosis not present

## 2016-12-14 DIAGNOSIS — G4733 Obstructive sleep apnea (adult) (pediatric): Secondary | ICD-10-CM | POA: Diagnosis not present

## 2016-12-14 DIAGNOSIS — K219 Gastro-esophageal reflux disease without esophagitis: Secondary | ICD-10-CM | POA: Diagnosis not present

## 2016-12-14 DIAGNOSIS — H02834 Dermatochalasis of left upper eyelid: Secondary | ICD-10-CM | POA: Diagnosis not present

## 2016-12-14 DIAGNOSIS — H02403 Unspecified ptosis of bilateral eyelids: Secondary | ICD-10-CM | POA: Diagnosis not present

## 2016-12-14 DIAGNOSIS — H02831 Dermatochalasis of right upper eyelid: Secondary | ICD-10-CM | POA: Diagnosis not present

## 2016-12-14 DIAGNOSIS — Z6841 Body Mass Index (BMI) 40.0 and over, adult: Secondary | ICD-10-CM | POA: Diagnosis not present

## 2016-12-14 DIAGNOSIS — Z87891 Personal history of nicotine dependence: Secondary | ICD-10-CM | POA: Diagnosis not present

## 2016-12-14 DIAGNOSIS — N400 Enlarged prostate without lower urinary tract symptoms: Secondary | ICD-10-CM | POA: Diagnosis not present

## 2016-12-14 DIAGNOSIS — H0259 Other disorders affecting eyelid function: Secondary | ICD-10-CM | POA: Diagnosis not present

## 2016-12-14 DIAGNOSIS — I10 Essential (primary) hypertension: Secondary | ICD-10-CM | POA: Diagnosis not present

## 2016-12-22 DIAGNOSIS — Z48810 Encounter for surgical aftercare following surgery on the sense organs: Secondary | ICD-10-CM | POA: Diagnosis not present

## 2016-12-22 DIAGNOSIS — H02831 Dermatochalasis of right upper eyelid: Secondary | ICD-10-CM | POA: Diagnosis not present

## 2016-12-22 DIAGNOSIS — L908 Other atrophic disorders of skin: Secondary | ICD-10-CM | POA: Diagnosis not present

## 2016-12-22 DIAGNOSIS — H02834 Dermatochalasis of left upper eyelid: Secondary | ICD-10-CM | POA: Diagnosis not present

## 2017-01-09 ENCOUNTER — Ambulatory Visit (INDEPENDENT_AMBULATORY_CARE_PROVIDER_SITE_OTHER): Payer: Medicare Other | Admitting: Family Medicine

## 2017-01-09 VITALS — BP 126/67 | HR 76 | Ht 70.0 in | Wt 322.0 lb

## 2017-01-09 DIAGNOSIS — J209 Acute bronchitis, unspecified: Secondary | ICD-10-CM | POA: Diagnosis not present

## 2017-01-09 MED ORDER — PREDNISONE 20 MG PO TABS: 40.0000 mg | ORAL_TABLET | Freq: Every day | ORAL | 0 refills | Status: DC

## 2017-01-09 MED ORDER — HYDROCODONE-HOMATROPINE 5-1.5 MG/5ML PO SYRP: 5.0000 mL | ORAL_SOLUTION | Freq: Every evening | ORAL | 0 refills | Status: DC | PRN

## 2017-01-09 NOTE — Progress Notes (Signed)
   Subjective:    Patient ID: Tracy Bennett, male    DOB: 1946-09-15, 71 y.o.   MRN: [id number]  HPI Cough x 2-3 wks pt reports that he has a deep cough and has been taking mucinex, and tylenol. denies fever, he has had some yellow mucus.  Fevers chills or sweats. He said he felt like he was starting to get better over the last couple days but then last night had a severe coughing fit overnight and feels like he's, had a setback. No nausea vomiting or diarrhea with it. He is not currently taking any cough or cold medication. No facial pressure or nasal congestion or headaches. He has coughed so much to the point that he does feel short of breath at times. No wheezing.    Review of Systems     Objective:   Physical Exam  Constitutional: He is oriented to person, place, and time. He appears well-developed and well-nourished.  HENT:  Head: Normocephalic and atraumatic.  Right Ear: External ear normal.  Left Ear: External ear normal.  Nose: Nose normal.  Mouth/Throat: Oropharynx is clear and moist.  TMs and canals are clear.   Eyes: Conjunctivae and EOM are normal. Pupils are equal, round, and reactive to light.  Neck: Neck supple. No thyromegaly present.  Cardiovascular: Normal rate and normal heart sounds.   Pulmonary/Chest: Effort normal and breath sounds normal.  Lymphadenopathy:    He has no cervical adenopathy.  Neurological: He is alert and oriented to person, place, and time.  Skin: Skin is warm and dry.  Psychiatric: He has a normal mood and affect.       Assessment & Plan:  Acute bronchitis-we'll treat with 5 days of prednisone. If not better at that point can consider treatment with azithromycin. Make sure drinking plenty of water and staying hydrated. Make sure to take prednisone with food and water to avoid GI upset or irritation. He did refill requests a prescription for hydrocodone cough syrup. We last gave him a bottle in May of last year.

## 2017-01-15 ENCOUNTER — Telehealth: Payer: Self-pay | Admitting: Family Medicine

## 2017-01-15 ENCOUNTER — Ambulatory Visit: Payer: Medicare Other | Admitting: Family Medicine

## 2017-01-15 NOTE — Telephone Encounter (Signed)
Pt states he has completed the Rx for Prednisone but still feels as if there is congestion in his chest. States he is not coughing as much though, so he is not able to clear his chest. Appt made with PCP for tomorrow for re-eval.

## 2017-01-16 ENCOUNTER — Encounter: Payer: Self-pay | Admitting: Family Medicine

## 2017-01-16 ENCOUNTER — Ambulatory Visit (INDEPENDENT_AMBULATORY_CARE_PROVIDER_SITE_OTHER): Payer: Medicare Other | Admitting: Family Medicine

## 2017-01-16 VITALS — BP 137/80 | HR 65 | Temp 97.5°F | Ht 70.0 in | Wt 329.0 lb

## 2017-01-16 DIAGNOSIS — J209 Acute bronchitis, unspecified: Secondary | ICD-10-CM | POA: Diagnosis not present

## 2017-01-16 DIAGNOSIS — R0602 Shortness of breath: Secondary | ICD-10-CM

## 2017-01-16 MED ORDER — PREDNISONE 20 MG PO TABS: 40.0000 mg | ORAL_TABLET | Freq: Every day | ORAL | 0 refills | Status: DC

## 2017-01-16 MED ORDER — AZITHROMYCIN 250 MG PO TABS: ORAL_TABLET | ORAL | 0 refills | Status: AC

## 2017-01-16 NOTE — Progress Notes (Signed)
   Subjective:    Patient ID: Tracy Bennett, male    DOB: [birth year], 71 y.o.   MRN: [id number]  HPI  71 year old male comes in today complaining of still not feeling well. He has had a cough for about 3-4 weeks at this point in time. She saw me about a week ago and diagnosed with acute bronchitis and put him on a round of prednisone. He says his cough actually got much better. Decrease in cough and decreasing sputum production. It's now more dry with occasional phlegm but yesterday he actually felt worse and felt short of breath. He denies any fevers chills or sweats or sore throat or ear pain. He actually finished his prednisone about 2 days ago.   Review of Systems     Objective:   Physical Exam  Constitutional: He is oriented to person, place, and time. He appears well-developed and well-nourished.  HENT:  Head: Normocephalic and atraumatic.  Right Ear: External ear normal.  Left Ear: External ear normal.  Nose: Nose normal.  Mouth/Throat: Oropharynx is clear and moist.  TMs and canals are clear.   Eyes: Conjunctivae and EOM are normal. Pupils are equal, round, and reactive to light.  Neck: Neck supple. No thyromegaly present.  Cardiovascular: Normal rate and normal heart sounds.   Pulmonary/Chest: Effort normal and breath sounds normal.  Lymphadenopathy:    He has no cervical adenopathy.  Neurological: He is alert and oriented to person, place, and time.  Skin: Skin is warm and dry.  Psychiatric: He has a normal mood and affect.        Assessment & Plan:  Acute bronchitis with worsening symptoms in the last 48 hours-we'll go ahead and treat with azithromycin. Offered him back on prednisone for 5 more days but he says that it makes him feel flushed and very hungry and would prefer not to take it so I decided to send in a prescription to the pharmacy in case he changes his mind but certainly he can hold off if he prefers. And if he is not feeling significantly better after 72  hours, on Friday, and I requested that he go get a chest x-ray done. I went ahead and placed an order just in case he decides to go.

## 2017-01-19 ENCOUNTER — Ambulatory Visit (INDEPENDENT_AMBULATORY_CARE_PROVIDER_SITE_OTHER): Payer: Medicare Other

## 2017-01-19 DIAGNOSIS — Z87891 Personal history of nicotine dependence: Secondary | ICD-10-CM

## 2017-01-19 DIAGNOSIS — R0602 Shortness of breath: Secondary | ICD-10-CM

## 2017-01-19 DIAGNOSIS — R05 Cough: Secondary | ICD-10-CM | POA: Diagnosis not present

## 2017-01-19 DIAGNOSIS — I517 Cardiomegaly: Secondary | ICD-10-CM

## 2017-02-06 ENCOUNTER — Other Ambulatory Visit: Payer: Self-pay | Admitting: *Deleted

## 2017-02-06 ENCOUNTER — Encounter: Payer: Self-pay | Admitting: Family Medicine

## 2017-02-06 MED ORDER — PANTOPRAZOLE SODIUM 40 MG PO TBEC: 40.0000 mg | DELAYED_RELEASE_TABLET | Freq: Every day | ORAL | 3 refills | Status: DC

## 2017-02-12 ENCOUNTER — Ambulatory Visit: Payer: Medicare Other

## 2017-02-13 DIAGNOSIS — L570 Actinic keratosis: Secondary | ICD-10-CM | POA: Diagnosis not present

## 2017-02-13 DIAGNOSIS — L821 Other seborrheic keratosis: Secondary | ICD-10-CM | POA: Diagnosis not present

## 2017-02-14 ENCOUNTER — Telehealth: Payer: Self-pay | Admitting: Family Medicine

## 2017-02-14 ENCOUNTER — Ambulatory Visit (INDEPENDENT_AMBULATORY_CARE_PROVIDER_SITE_OTHER): Payer: Medicare Other | Admitting: Family Medicine

## 2017-02-14 ENCOUNTER — Encounter: Payer: Self-pay | Admitting: Family Medicine

## 2017-02-14 VITALS — BP 139/61 | HR 63 | Ht 70.0 in | Wt 330.0 lb

## 2017-02-14 DIAGNOSIS — R7301 Impaired fasting glucose: Secondary | ICD-10-CM

## 2017-02-14 DIAGNOSIS — Z23 Encounter for immunization: Secondary | ICD-10-CM

## 2017-02-14 DIAGNOSIS — Z0000 Encounter for general adult medical examination without abnormal findings: Secondary | ICD-10-CM | POA: Diagnosis not present

## 2017-02-14 DIAGNOSIS — I10 Essential (primary) hypertension: Secondary | ICD-10-CM

## 2017-02-14 LAB — LIPID PANEL W/REFLEX DIRECT LDL
CHOLESTEROL: 133 mg/dL (ref <200)
HDL: 35 mg/dL — AB (ref >40)
LDL-Cholesterol: 81 mg/dL
Non-HDL Cholesterol (Calc): 98 mg/dL (ref <130)
Total CHOL/HDL Ratio: 3.8 Ratio (ref <5.0)
Triglycerides: 91 mg/dL (ref <150)

## 2017-02-14 LAB — COMPLETE METABOLIC PANEL WITH GFR
ALBUMIN: 4.1 g/dL (ref 3.6–5.1)
ALK PHOS: 46 U/L (ref 40–115)
ALT: 46 U/L (ref 9–46)
AST: 30 U/L (ref 10–35)
BILIRUBIN TOTAL: 0.9 mg/dL (ref 0.2–1.2)
BUN: 13 mg/dL (ref 7–25)
CALCIUM: 9.2 mg/dL (ref 8.6–10.3)
CO2: 28 mmol/L (ref 20–31)
Chloride: 106 mmol/L (ref 98–110)
Creat: 1.33 mg/dL — ABNORMAL HIGH (ref 0.70–1.18)
GFR, EST NON AFRICAN AMERICAN: 54 mL/min — AB (ref >=60)
GFR, Est African American: 62 mL/min (ref >=60)
GLUCOSE: 121 mg/dL — AB (ref 65–99)
Potassium: 4.2 mmol/L (ref 3.5–5.3)
SODIUM: 140 mmol/L (ref 135–146)
Total Protein: 6.3 g/dL (ref 6.1–8.1)

## 2017-02-14 NOTE — Progress Notes (Signed)
Subjective:   Tracy Bennett is a 71 y.o. male who presents for Medicare Annual/Subsequent preventive examination.  He said he feels like his BiPAP is not working as well as it used to. He's not sleeping as well with it.  Review of Systems:  Comprehensive ROS is negative.        Objective:    Vitals: BP 139/61   Pulse 63   Ht 5\' 10"  (1.778 m)   Wt (!) 330 lb (149.7 kg)   SpO2 95%   BMI 47.35 kg/m   Body mass index is 47.35 kg/m.  Tobacco History  Smoking Status  . Former Smoker  . Quit date: 02/09/1980  Smokeless Tobacco  . Former Systems developer     Counseling given: Not Answered   Past Medical History:  Diagnosis Date  . GERD (gastroesophageal reflux disease)   . Hyperlipidemia   . Hypertension   . Kidney stones    x 4    Past Surgical History:  Procedure Laterality Date  . CARPAL TUNNEL RELEASE     Right   . CERVICAL FUSION  2000   C5-6  . FOOT TENDON SURGERY  11/14/2011   Dr. Sharol Given  . left knee surgery     ACL repair   . UMBILICAL HERNIA REPAIR     Family History  Problem Relation Age of Onset  . COPD Mother   . Hypertension Mother   . Emphysema Mother    History  Sexual Activity  . Sexual activity: Yes    Outpatient Encounter Prescriptions as of 02/14/2017  Medication Sig  . finasteride (PROSCAR) 5 MG tablet Take 1 tablet (5 mg total) by mouth daily.  Marland Kitchen oxybutynin (DITROPAN-XL) 10 MG 24 hr tablet   . pantoprazole (PROTONIX) 40 MG tablet Take 1 tablet (40 mg total) by mouth daily.  . simvastatin (ZOCOR) 40 MG tablet TAKE 1 TABLET (40 MG TOTAL) BY MOUTH DAILY AT 6 PM.  . Tamsulosin HCl (FLOMAX) 0.4 MG CAPS Take 0.4 mg by mouth daily.   Marland Kitchen tiZANidine (ZANAFLEX) 4 MG tablet Take 1 tablet (4 mg total) by mouth every 8 (eight) hours as needed.  . valsartan-hydrochlorothiazide (DIOVAN-HCT) 80-12.5 MG tablet TAKE 1 TABLET BY MOUTH EVERY DAY  . [DISCONTINUED] predniSONE (DELTASONE) 20 MG tablet Take 2 tablets (40 mg total) by mouth daily.   No  facility-administered encounter medications on file as of 02/14/2017.     Activities of Daily Living No flowsheet data found.  Patient Care Team: Hali Marry, MD as PCP - General (Family Medicine) Sonnie Alamo, MD as Referring Physician (Gastroenterology) Bethann Punches, MD as Referring Physician (Urology) Deboraha Sprang, MD as Consulting Physician (Cardiology) Magnus Sinning, MD as Consulting Physician   Assessment:    Medicare WEllness Exam  Exercise Activities and Dietary recommendations    Goals    None     Fall Risk Fall Risk  01/09/2017 01/29/2014 01/29/2014 01/28/2013  Falls in the past year? No No No No   Depression Screen PHQ 2/9 Scores 02/14/2017 01/09/2017 01/29/2014 01/29/2014  PHQ - 2 Score 0 0 0 0    Cognitive Function     6CIT Screen 02/14/2017  What Year? 0 points  What month? 0 points  What time? 0 points  Count back from 20 0 points  Months in reverse 0 points  Repeat phrase 4 points  Total Score 4    Immunization History  Administered Date(s) Administered  . Influenza Split 11/27/2012, 12/16/2012  . Influenza, Seasonal,  Injecte, Preservative Fre 12/11/2013  . Influenza,inj,Quad PF,36+ Mos 09/01/2014, 08/27/2015, 08/27/2016  . Pneumococcal Polysaccharide-23 02/09/2012  . Tdap 01/28/2013  . Zoster 09/15/2011   Screening Tests Health Maintenance  Topic Date Due  . PNA vac Low Risk Adult (2 of 2 - PCV13) 02/08/2013  . TETANUS/TDAP  01/29/2023  . COLONOSCOPY  10/01/2023  . INFLUENZA VACCINE  Addressed  . Hepatitis C Screening  Completed      Plan:    During the course of the visit the patient was educated and counseled about the following appropriate screening and preventive services:   Vaccines to include Pneumoccal, - given Prenvar 13 today.   Cardiovascular Disease  Colorectal cancer screening - UTD  Diabetes screening - due for repeat A1C  Prostate Cancer Screening - follows with Urology.    Glaucoma screening - gets yearly  [facility] with Dr. Marjo Bicker  Obstructive sleep apnea-will call [facility] and see if they can get is a download. May need to make some adjustments to his BiPAP.   Patient Instructions (the written plan) was given to the patient.    Kajsa Butrum, MD  02/14/2017

## 2017-02-14 NOTE — Telephone Encounter (Signed)
Spoke with Tracy Bennett, she will fax over download.

## 2017-02-14 NOTE — Patient Instructions (Signed)
Keep up a regular exercise program and make sure you are eating a healthy diet - set a goal of going to the gym 2 times a week to start. Try to eat 4 servings of dairy a day, or if you are lactose intolerant take a calcium with vitamin D daily.  Your vaccines are up to date - Given Prenvar 13 today.

## 2017-02-14 NOTE — Telephone Encounter (Signed)
Left VM for [employer], requested callback.

## 2017-02-14 NOTE — Telephone Encounter (Signed)
Call [facility] and see if they can get a download off his BiPAP so that we can make adjustments. He feels like it's not been working as effectively. He's been on the same settings for almost 3 years.  Beatrice Lecher, MD

## 2017-02-15 LAB — HEMOGLOBIN A1C
HEMOGLOBIN A1C: 6.2 % — AB (ref <5.7)
MEAN PLASMA GLUCOSE: 131 mg/dL

## 2017-02-21 ENCOUNTER — Telehealth: Payer: Self-pay | Admitting: Family Medicine

## 2017-02-21 NOTE — Telephone Encounter (Signed)
lvm informing pt of results.Tracy Bennett  

## 2017-02-21 NOTE — Telephone Encounter (Signed)
Call patient: I did get his compliance report for his CPAP. 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 He used it for 30 out of 30 days. And he used it consistently for greater than 4 hours every night. On average he was using it for 6 hours and 18 minutes per night. AHI looks fantastic at 0.8 so it looks like it is actually working really well.

## 2017-03-28 DIAGNOSIS — H6123 Impacted cerumen, bilateral: Secondary | ICD-10-CM | POA: Diagnosis not present

## 2017-04-13 ENCOUNTER — Other Ambulatory Visit: Payer: Self-pay | Admitting: Family Medicine

## 2017-04-18 ENCOUNTER — Other Ambulatory Visit: Payer: Self-pay | Admitting: Family Medicine

## 2017-07-16 ENCOUNTER — Ambulatory Visit (INDEPENDENT_AMBULATORY_CARE_PROVIDER_SITE_OTHER): Payer: Medicare Other | Admitting: Physician Assistant

## 2017-07-16 ENCOUNTER — Encounter: Payer: Self-pay | Admitting: Physician Assistant

## 2017-07-16 VITALS — BP 131/78 | HR 65 | Temp 97.5°F | Ht 70.0 in | Wt 330.0 lb

## 2017-07-16 DIAGNOSIS — L237 Allergic contact dermatitis due to plants, except food: Secondary | ICD-10-CM

## 2017-07-16 MED ORDER — PREDNISONE 10 MG (48) PO TBPK: ORAL_TABLET | Freq: Every day | ORAL | 0 refills | Status: DC

## 2017-07-16 MED ORDER — BETAMETHASONE DIPROPIONATE 0.05 % EX OINT: TOPICAL_OINTMENT | CUTANEOUS | 0 refills | Status: DC

## 2017-07-16 MED ORDER — METHYLPREDNISOLONE ACETATE 80 MG/ML IJ SUSP
80.0000 mg | Freq: Once | INTRAMUSCULAR | Status: AC
  Administered 2017-07-16: 80 mg via INTRAMUSCULAR

## 2017-07-16 NOTE — Addendum Note (Signed)
Addended by: Delrae Alfred on: 07/16/2017 09:06 AM   Modules accepted: Orders

## 2017-07-16 NOTE — Patient Instructions (Addendum)
- Injection today - Oral steroid taper to prevent recurrence - Topical steroids applied to arms and extremities. Avoid contact with face/genitalia. Do not use for more than 2 weeks.   Poison Oak Dermatitis Poison oak dermatitis is inflammation of the skin that is caused by contact with the allergens on the leaves of the poison oak (toxicodendron) plant. The skin reaction often includes redness, swelling, blisters, and extreme itching. What are the causes? This condition is caused by a specific chemical (urushiol) that is found in the sap of the poison oak plant. This chemical is sticky and it can be easily spread to people, animals, and objects. You can get poison oak dermatitis by:  Having direct contact with a poison oak plant.  Touching animals, other people, or objects that have come in contact with poison oak and have the chemical on them.  What increases the risk? This condition is more likely to develop in people who:  Are outdoors often.  Go outdoors without wearing protective clothing, such as closed shoes, long pants, and a long-sleeved shirt.  What are the signs or symptoms? Symptoms of this condition include:  Redness of the skin.  A rash that may develop blisters.  Extreme itching.  Swelling. This may occur if the reaction is more severe.  Symptoms usually last for 1-2 weeks. However, the first time you develop this condition, symptoms may last 3-4 weeks. How is this diagnosed? This condition may be diagnosed based on your symptoms and a physical exam. Your health care provider may also ask you about any recent outdoor activity. How is this treated? Treatment for this condition will vary depending on how severe it is. Treatment may include:  Hydrocortisone creams or calamine lotions to relieve itching.  Oatmeal baths to soothe the skin.  Over-the-counter antihistamine tablets.  Oral steroid medicine for more severe outbreaks.  Follow these instructions at  home:  Take or apply over-the-counter and prescription medicines only as told by your health care provider.  Wash exposed skin as soon as possible with soap and cold water.  Use hydrocortisone creams or calamine lotion as needed to soothe the skin and relieve itching.  Take oatmeal baths as needed. Use colloidal oatmeal. You can get this at your local pharmacy or grocery store. Follow the instructions on the packaging.  Do not scratch or rub your skin.  While you have the rash, wash clothes right after you wear them. How is this prevented?  Learn to identify the poison oak plant and avoid contact with the plant. This plant can be recognized by the number of leaves. Generally, poison oak has three leaves with flowering branches on a single stem. The leaves are often a bit fuzzy and have a toothlike edge.  If you have been exposed to poison oak, thoroughly wash with soap and water right away. You have about 30 minutes to remove the plant resin before it will cause the rash. Be sure to wash under your fingernails because any plant resin there will continue to spread the rash.  When hiking or camping, wear clothes that will help you avoid exposure on the skin. This includes long pants, a long-sleeved shirt, tall socks, and hiking boots. You can also apply preventive lotion to your skin to help limit exposure.  If you suspect that your clothes or outdoor gear came in contact with poison oak, rinse them off outside with a garden hose before bringing them inside your house. Contact a health care provider if:  You  have open sores in the rash area.  You have more redness, swelling, or pain in the affected area.  You have redness that spreads beyond the rash area.  You have fluid, blood, or pus coming from the affected area.  You have a fever.  You have a rash over a large area of your body.  You have a rash on your eyes, mouth, or genitals.  Your rash does not improve after a few  days. Get help right away if:  Your face swells or your eyes swell shut.  You have trouble breathing.  You have trouble swallowing. This information is not intended to replace advice given to you by your health care provider. Make sure you discuss any questions you have with your health care provider. Document Released: 05/20/2003 Document Revised: 04/20/2016 Document Reviewed: 04/21/2015 Elsevier Interactive Patient Education  [employer].

## 2017-07-16 NOTE — Progress Notes (Signed)
HPI:                                                                Tracy Bennett is a 71 y.o. male who presents to [facility] today for rash  Onset: 6 days ago Location: started on wrists and forearms, has spread to face Duration: constant Character: pruritic, red Treatments tried: Calamine Associated symptoms: itchy eyes Reports possible exposure to poison oak while mowing the lawn last week  Recent illness / systemic symptoms: none  Medication / drug exposure: none Recent travel: none Animal/insect exposure: none History of allergies: yes, poison ivy poison oak Exposure to new soaps, perfumes, cleaning products: none Exposure to chemicals: none   Past Medical History:  Diagnosis Date  . GERD (gastroesophageal reflux disease)   . Hyperlipidemia   . Hypertension   . Kidney stones    x 4    Past Surgical History:  Procedure Laterality Date  . CARPAL TUNNEL RELEASE     Right   . CERVICAL FUSION  2000   C5-6  . FOOT TENDON SURGERY  11/14/2011   Dr. Sharol Given  . left knee surgery     ACL repair   . UMBILICAL HERNIA REPAIR     Social History  Substance Use Topics  . Smoking status: Former Smoker    Quit date: 02/09/1980  . Smokeless tobacco: Former Systems developer  . Alcohol use Yes   family history includes COPD in his mother; Emphysema in his mother; Hypertension in his mother.  ROS: negative except as noted in the HPI  Medications: Current Outpatient Prescriptions  Medication Sig Dispense Refill  . finasteride (PROSCAR) 5 MG tablet Take 1 tablet (5 mg total) by mouth daily. 30 tablet 1  . oxybutynin (DITROPAN-XL) 10 MG 24 hr tablet     . pantoprazole (PROTONIX) 40 MG tablet Take 1 tablet (40 mg total) by mouth daily. 90 tablet 3  . simvastatin (ZOCOR) 40 MG tablet TAKE 1 TABLET (40 MG TOTAL) BY MOUTH DAILY AT 6 PM. 90 tablet 1  . Tamsulosin HCl (FLOMAX) 0.4 MG CAPS Take 0.4 mg by mouth daily.     Marland Kitchen tiZANidine (ZANAFLEX) 4  MG tablet Take 1 tablet (4 mg total) by mouth every 8 (eight) hours as needed. 30 tablet 0  . valsartan-hydrochlorothiazide (DIOVAN-HCT) 80-12.5 MG tablet TAKE 1 TABLET BY MOUTH DAILY. GENERIC PLEASE 90 tablet 1  . betamethasone dipropionate (DIPROLENE) 0.05 % ointment Apply to arms and affected areas twice a day. Avoid contact with face. 15 g 0  . predniSONE (STERAPRED UNI-PAK 48 TAB) 10 MG (48) TBPK tablet Take by mouth daily. Use as directed for taper 1 tablet 0   No current facility-administered medications for this visit.    Allergies  Allergen Reactions  . Amoxicillin Swelling    Tongue sores  . Contrast Media [Iodinated Diagnostic Agents] Shortness Of Breath  . Lisinopril Cough       Objective:  BP 131/78   Pulse 65   Temp (!) 97.5 F (36.4 C) (Oral)   Ht 5\' 10"  (1.778 m)   Wt (!) 330 lb (149.7 kg)   BMI 47.35 kg/m  Physical Exam  Constitutional: He is oriented to person, place, and time.  HENT:  Head: Normocephalic and atraumatic.  Eyes: Conjunctivae are normal. Right eye exhibits no discharge. Left eye exhibits no discharge.  No scleral injection  Pulmonary/Chest: Effort normal.  Musculoskeletal: He exhibits no deformity.  Neurological: He is alert and oriented to person, place, and time.  Skin: Skin is warm, dry and intact. Rash noted. Rash is papular and vesicular.     Vitals reviewed.     No results found for this or any previous visit (from the past 72 hour(s)). No results found.    Assessment and Plan: 71 y.o. male with   1. Allergic contact dermatitis due to plants, except food - Depomedrol 80mg  given in office. Prednisone taper. Diprolene to affected extremities. - predniSONE (STERAPRED UNI-PAK 48 TAB) 10 MG (48) TBPK tablet; Take by mouth daily. Use as directed for taper  Dispense: 1 tablet; Refill: 0 - betamethasone dipropionate (DIPROLENE) 0.05 % ointment; Apply to arms and affected areas twice a day. Avoid contact with face.  Dispense: 15 g;  Refill: 0  Patient education and anticipatory guidance given Patient agrees with treatment plan Follow-up as needed if symptoms worsen or fail to improve  Darlyne Russian PA-C

## 2017-08-16 DIAGNOSIS — D485 Neoplasm of uncertain behavior of skin: Secondary | ICD-10-CM | POA: Diagnosis not present

## 2017-08-16 DIAGNOSIS — L821 Other seborrheic keratosis: Secondary | ICD-10-CM | POA: Diagnosis not present

## 2017-08-16 DIAGNOSIS — L570 Actinic keratosis: Secondary | ICD-10-CM | POA: Diagnosis not present

## 2017-08-16 DIAGNOSIS — D2362 Other benign neoplasm of skin of left upper limb, including shoulder: Secondary | ICD-10-CM | POA: Diagnosis not present

## 2017-08-20 DIAGNOSIS — Z961 Presence of intraocular lens: Secondary | ICD-10-CM | POA: Diagnosis not present

## 2017-08-20 DIAGNOSIS — Z9889 Other specified postprocedural states: Secondary | ICD-10-CM | POA: Diagnosis not present

## 2017-08-20 DIAGNOSIS — H02836 Dermatochalasis of left eye, unspecified eyelid: Secondary | ICD-10-CM | POA: Diagnosis not present

## 2017-08-20 DIAGNOSIS — Z9849 Cataract extraction status, unspecified eye: Secondary | ICD-10-CM | POA: Diagnosis not present

## 2017-08-20 DIAGNOSIS — H1013 Acute atopic conjunctivitis, bilateral: Secondary | ICD-10-CM | POA: Diagnosis not present

## 2017-08-20 DIAGNOSIS — H02833 Dermatochalasis of right eye, unspecified eyelid: Secondary | ICD-10-CM | POA: Diagnosis not present

## 2017-08-20 DIAGNOSIS — H17813 Minor opacity of cornea, bilateral: Secondary | ICD-10-CM | POA: Diagnosis not present

## 2017-08-20 DIAGNOSIS — J302 Other seasonal allergic rhinitis: Secondary | ICD-10-CM | POA: Diagnosis not present

## 2017-08-20 DIAGNOSIS — H04123 Dry eye syndrome of bilateral lacrimal glands: Secondary | ICD-10-CM | POA: Diagnosis not present

## 2017-09-10 DIAGNOSIS — B369 Superficial mycosis, unspecified: Secondary | ICD-10-CM | POA: Diagnosis not present

## 2017-09-24 DIAGNOSIS — Z23 Encounter for immunization: Secondary | ICD-10-CM | POA: Diagnosis not present

## 2017-09-25 DIAGNOSIS — B369 Superficial mycosis, unspecified: Secondary | ICD-10-CM | POA: Diagnosis not present

## 2017-10-16 ENCOUNTER — Ambulatory Visit (INDEPENDENT_AMBULATORY_CARE_PROVIDER_SITE_OTHER): Payer: Medicare Other | Admitting: Family Medicine

## 2017-10-16 ENCOUNTER — Encounter: Payer: Self-pay | Admitting: Family Medicine

## 2017-10-16 VITALS — BP 144/70 | HR 73 | Temp 97.7°F | Resp 16 | Ht 70.0 in | Wt 327.1 lb

## 2017-10-16 DIAGNOSIS — R05 Cough: Secondary | ICD-10-CM

## 2017-10-16 DIAGNOSIS — R059 Cough, unspecified: Secondary | ICD-10-CM

## 2017-10-16 MED ORDER — HYDROCODONE-HOMATROPINE 5-1.5 MG/5ML PO SYRP: 5.0000 mL | ORAL_SOLUTION | Freq: Three times a day (TID) | ORAL | 0 refills | Status: DC | PRN

## 2017-10-16 MED ORDER — PREDNISONE 10 MG PO TABS: 30.0000 mg | ORAL_TABLET | Freq: Every day | ORAL | 0 refills | Status: DC

## 2017-10-16 MED ORDER — AZITHROMYCIN 250 MG PO TABS: 250.0000 mg | ORAL_TABLET | Freq: Every day | ORAL | 0 refills | Status: DC

## 2017-10-16 NOTE — Patient Instructions (Signed)
Thank you for coming in today. Call or go to the emergency room if you get worse, have trouble breathing, have chest pains, or palpitations.  Take prednisone and cough medicine.  Use azithromycin if not better.    Acute Bronchitis, Adult Acute bronchitis is when air tubes (bronchi) in the lungs suddenly get swollen. The condition can make it hard to breathe. It can also cause these symptoms:  A cough.  Coughing up clear, yellow, or green mucus.  Wheezing.  Chest congestion.  Shortness of breath.  A fever.  Body aches.  Chills.  A sore throat.  Follow these instructions at home: Medicines  Take over-the-counter and prescription medicines only as told by your doctor.  If you were prescribed an antibiotic medicine, take it as told by your doctor. Do not stop taking the antibiotic even if you start to feel better. General instructions  Rest.  Drink enough fluids to keep your pee (urine) clear or pale yellow.  Avoid smoking and secondhand smoke. If you smoke and you need help quitting, ask your doctor. Quitting will help your lungs heal faster.  Use an inhaler, cool mist vaporizer, or humidifier as told by your doctor.  Keep all follow-up visits as told by your doctor. This is important. How is this prevented? To lower your risk of getting this condition again:  Wash your hands often with soap and water. If you cannot use soap and water, use hand sanitizer.  Avoid contact with people who have cold symptoms.  Try not to touch your hands to your mouth, nose, or eyes.  Make sure to get the flu shot every year.  Contact a doctor if:  Your symptoms do not get better in 2 weeks. Get help right away if:  You cough up blood.  You have chest pain.  You have very bad shortness of breath.  You become dehydrated.  You faint (pass out) or keep feeling like you are going to pass out.  You keep throwing up (vomiting).  You have a very bad headache.  Your fever or  chills gets worse. This information is not intended to replace advice given to you by your health care provider. Make sure you discuss any questions you have with your health care provider. Document Released: 05/01/2008 Document Revised: 06/21/2016 Document Reviewed: 05/03/2016 Elsevier Interactive Patient Education  2017 [employer].

## 2017-10-16 NOTE — Progress Notes (Signed)
Tracy Bennett is a 71 y.o. male who presents to [place]: [place] today for 5 days history worsening cough and sputum production. Patient states that he woke up Friday feeling poorly, with chills and had to stay in bed all day. Improved slightly on Monday, but has been worse since then. Patient states cough is worst in the morning with some dark sputum production. Sputum is yellow throughout most of the day. Patient states cough responds well to hydrocodone cough medicine - taking only at night.   Patient only endorsing minimal shortness of breath, especially during coughing bout. Has some facial congestion, but no sensitivity or headache. Denies fevers, GI distress, and nausea/vomiting.  Patient has had similar symptoms in years past. Those illnesses have responded well to both azithromycin and prednisone.    Past Medical History:  Diagnosis Date  . GERD (gastroesophageal reflux disease)   . Hyperlipidemia   . Hypertension   . Kidney stones    x 4    Past Surgical History:  Procedure Laterality Date  . CARPAL TUNNEL RELEASE     Right   . CERVICAL FUSION  2000   C5-6  . FOOT TENDON SURGERY  11/14/2011   Dr. Sharol Given  . left knee surgery     ACL repair   . UMBILICAL HERNIA REPAIR     Social History   Tobacco Use  . Smoking status: Former Smoker    Last attempt to quit: 02/09/1980    Years since quitting: 37.7  . Smokeless tobacco: Former Network engineer Use Topics  . Alcohol use: Yes   family history includes COPD in his mother; Emphysema in his mother; Hypertension in his mother.  ROS as above:  Medications: Current Outpatient Medications  Medication Sig Dispense Refill  . betamethasone dipropionate (DIPROLENE) 0.05 % ointment Apply to arms and affected areas twice a day. Avoid contact with face. 15 g 0  . finasteride (PROSCAR) 5 MG tablet Take 1 tablet (5 mg  total) by mouth daily. 30 tablet 1  . loratadine (CLARITIN) 10 MG tablet Take 10 mg by mouth daily.    Marland Kitchen oxybutynin (DITROPAN-XL) 10 MG 24 hr tablet     . pantoprazole (PROTONIX) 40 MG tablet Take 1 tablet (40 mg total) by mouth daily. 90 tablet 3  . simvastatin (ZOCOR) 40 MG tablet TAKE 1 TABLET (40 MG TOTAL) BY MOUTH DAILY AT 6 PM. 90 tablet 1  . Tamsulosin HCl (FLOMAX) 0.4 MG CAPS Take 0.4 mg by mouth daily.     Marland Kitchen tiZANidine (ZANAFLEX) 4 MG tablet Take 1 tablet (4 mg total) by mouth every 8 (eight) hours as needed. 30 tablet 0  . valsartan-hydrochlorothiazide (DIOVAN-HCT) 80-12.5 MG tablet TAKE 1 TABLET BY MOUTH DAILY. GENERIC PLEASE 90 tablet 1  . azithromycin (ZITHROMAX) 250 MG tablet Take 1 tablet (250 mg total) by mouth daily. Take first 2 tablets together, then 1 every day until finished. 6 tablet 0  . HYDROcodone-homatropine (HYCODAN) 5-1.5 MG/5ML syrup Take 5 mLs by mouth every 8 (eight) hours as needed for cough. 120 mL 0  . predniSONE (DELTASONE) 10 MG tablet Take 3 tablets (30 mg total) by mouth daily with breakfast. 15 tablet 0   No current facility-administered medications for this visit.    Allergies  Allergen Reactions  . Amoxicillin Swelling    Tongue sores  . Contrast Media [Iodinated Diagnostic Agents] Shortness Of Breath  . Lisinopril Cough    Health  Maintenance Health Maintenance  Topic Date Due  . TETANUS/TDAP  01/29/2023  . COLONOSCOPY  10/01/2023  . INFLUENZA VACCINE  Completed  . Hepatitis C Screening  Completed  . PNA vac Low Risk Adult  Completed     Exam:  BP (!) 144/70   Pulse 73   Temp 97.7 F (36.5 C) (Oral)   Resp 16   Ht 5\' 10"  (1.778 m)   Wt (!) 327 lb 1.9 oz (148.4 kg)   SpO2 96%   BMI 46.94 kg/m  Gen: Well NAD HEENT: EOMI,  MMM, no tenderness of sinus. Mouth and ears without erythema. No effusion behind TM.  Lungs: Normal work of breathing. CTABL. No crackles or wheezes. Heart: RRR no MRG Abd: NABS, Soft. Nondistended,  Nontender Exts: Brisk capillary refill, warm and well perfused.   OLD EKG reviewed. Qtc 420s   No results found for this or any previous visit (from the past 72 hour(s)). No results found.  Assessment and Plan: 71 y.o. male with 5 day history of worsening productive cough, low energy, and chills. Patient has not had fever or signs of systemic illness. Patient likely with bronchitis secondary to viral process. Low suspicion for bacterial etiology at this juncture. Low concern for pneumonia or sinusitis at this point.   He has responded well to prednisone in the past. We will presrcibe 5 day course of prednisone. Patient can continue to take hycodan cough syrup. Prescribed azithromycin as back up if patient worsens or develops fevers over the next several days.   Patient can call or come in to follow-up if not responding to therapy or worsening over the next several days.   No orders of the defined types were placed in this encounter.  Meds ordered this encounter  Medications  . loratadine (CLARITIN) 10 MG tablet    Sig: Take 10 mg by mouth daily.  . predniSONE (DELTASONE) 10 MG tablet    Sig: Take 3 tablets (30 mg total) by mouth daily with breakfast.    Dispense:  15 tablet    Refill:  0  . HYDROcodone-homatropine (HYCODAN) 5-1.5 MG/5ML syrup    Sig: Take 5 mLs by mouth every 8 (eight) hours as needed for cough.    Dispense:  120 mL    Refill:  0  . azithromycin (ZITHROMAX) 250 MG tablet    Sig: Take 1 tablet (250 mg total) by mouth daily. Take first 2 tablets together, then 1 every day until finished.    Dispense:  6 tablet    Refill:  0     Discussed warning signs or symptoms. Please see discharge instructions. Patient expresses understanding.

## 2017-10-26 DIAGNOSIS — N3943 Post-void dribbling: Secondary | ICD-10-CM | POA: Diagnosis not present

## 2017-10-26 DIAGNOSIS — I878 Other specified disorders of veins: Secondary | ICD-10-CM | POA: Diagnosis not present

## 2017-10-26 DIAGNOSIS — N200 Calculus of kidney: Secondary | ICD-10-CM | POA: Diagnosis not present

## 2017-10-26 DIAGNOSIS — I10 Essential (primary) hypertension: Secondary | ICD-10-CM | POA: Diagnosis not present

## 2017-10-26 DIAGNOSIS — N401 Enlarged prostate with lower urinary tract symptoms: Secondary | ICD-10-CM | POA: Diagnosis not present

## 2017-10-29 ENCOUNTER — Ambulatory Visit (INDEPENDENT_AMBULATORY_CARE_PROVIDER_SITE_OTHER): Payer: Medicare Other

## 2017-10-29 ENCOUNTER — Encounter: Payer: Self-pay | Admitting: Family Medicine

## 2017-10-29 ENCOUNTER — Ambulatory Visit (INDEPENDENT_AMBULATORY_CARE_PROVIDER_SITE_OTHER): Payer: Medicare Other | Admitting: Family Medicine

## 2017-10-29 VITALS — BP 146/80 | HR 71 | Temp 97.5°F | Ht 70.0 in | Wt 325.0 lb

## 2017-10-29 DIAGNOSIS — R059 Cough, unspecified: Secondary | ICD-10-CM

## 2017-10-29 DIAGNOSIS — R05 Cough: Secondary | ICD-10-CM

## 2017-10-29 DIAGNOSIS — R0602 Shortness of breath: Secondary | ICD-10-CM | POA: Diagnosis not present

## 2017-10-29 MED ORDER — ALBUTEROL SULFATE HFA 108 (90 BASE) MCG/ACT IN AERS: 2.0000 | INHALATION_SPRAY | Freq: Four times a day (QID) | RESPIRATORY_TRACT | 0 refills | Status: DC | PRN

## 2017-10-29 MED ORDER — HYDROCODONE-HOMATROPINE 5-1.5 MG/5ML PO SYRP: 5.0000 mL | ORAL_SOLUTION | Freq: Three times a day (TID) | ORAL | 0 refills | Status: DC | PRN

## 2017-10-29 NOTE — Patient Instructions (Signed)
Thank you for coming in today. Get xray today.  Schedule a follow up visit with Dr Madilyn Fireman in about 2 weeks.  Return sooner if needed.  If not better the next step would be ECHO for heart  If your belly pain worsens, or you have high fever, bad vomiting, blood in your stool or black tarry stool go to the Emergency Room.    Acute Bronchitis, Adult Acute bronchitis is when air tubes (bronchi) in the lungs suddenly get swollen. The condition can make it hard to breathe. It can also cause these symptoms:  A cough.  Coughing up clear, yellow, or green mucus.  Wheezing.  Chest congestion.  Shortness of breath.  A fever.  Body aches.  Chills.  A sore throat.  Follow these instructions at home: Medicines  Take over-the-counter and prescription medicines only as told by your doctor.  If you were prescribed an antibiotic medicine, take it as told by your doctor. Do not stop taking the antibiotic even if you start to feel better. General instructions  Rest.  Drink enough fluids to keep your pee (urine) clear or pale yellow.  Avoid smoking and secondhand smoke. If you smoke and you need help quitting, ask your doctor. Quitting will help your lungs heal faster.  Use an inhaler, cool mist vaporizer, or humidifier as told by your doctor.  Keep all follow-up visits as told by your doctor. This is important. How is this prevented? To lower your risk of getting this condition again:  Wash your hands often with soap and water. If you cannot use soap and water, use hand sanitizer.  Avoid contact with people who have cold symptoms.  Try not to touch your hands to your mouth, nose, or eyes.  Make sure to get the flu shot every year.  Contact a doctor if:  Your symptoms do not get better in 2 weeks. Get help right away if:  You cough up blood.  You have chest pain.  You have very bad shortness of breath.  You become dehydrated.  You faint (pass out) or keep feeling  like you are going to pass out.  You keep throwing up (vomiting).  You have a very bad headache.  Your fever or chills gets worse. This information is not intended to replace advice given to you by your health care provider. Make sure you discuss any questions you have with your health care provider. Document Released: 05/01/2008 Document Revised: 06/21/2016 Document Reviewed: 05/03/2016 Elsevier Interactive Patient Education  2017 [employer].

## 2017-10-29 NOTE — Progress Notes (Signed)
Tracy Bennett is a 71 y.o. male who presents to [place]: [place] today for cough and shortness of breath. Tracy Bennett was seen about 2 weeks ago for cough and sinusitis. He was empirically treated with prednisone and azithromycin and notes that his sinus infection symptoms improved. However he continues to experience a cough and notes shortness of breath with exertion. He denies any chest pain or leg swelling or orthopnea. He continues to use the Hycodan cough syrup that I prescribed 2 weeks ago as well which does help some. He notes that he is about to run out of this.   Past Medical History:  Diagnosis Date  . GERD (gastroesophageal reflux disease)   . Hyperlipidemia   . Hypertension   . Kidney stones    x 4    Past Surgical History:  Procedure Laterality Date  . CARPAL TUNNEL RELEASE     Right   . CERVICAL FUSION  2000   C5-6  . FOOT TENDON SURGERY  11/14/2011   Dr. Sharol Given  . left knee surgery     ACL repair   . UMBILICAL HERNIA REPAIR     Social History   Tobacco Use  . Smoking status: Former Smoker    Last attempt to quit: 02/09/1980    Years since quitting: 37.7  . Smokeless tobacco: Former Network engineer Use Topics  . Alcohol use: Yes   family history includes COPD in his mother; Emphysema in his mother; Hypertension in his mother.  ROS as above:  Medications: Current Outpatient Medications  Medication Sig Dispense Refill  . betamethasone dipropionate (DIPROLENE) 0.05 % ointment Apply to arms and affected areas twice a day. Avoid contact with face. 15 g 0  . finasteride (PROSCAR) 5 MG tablet Take 1 tablet (5 mg total) by mouth daily. 30 tablet 1  . HYDROcodone-homatropine (HYCODAN) 5-1.5 MG/5ML syrup Take 5 mLs by mouth every 8 (eight) hours as needed for cough. 120 mL 0  . loratadine (CLARITIN) 10 MG tablet Take 10 mg by mouth daily.    Marland Kitchen oxybutynin  (DITROPAN-XL) 10 MG 24 hr tablet     . pantoprazole (PROTONIX) 40 MG tablet Take 1 tablet (40 mg total) by mouth daily. 90 tablet 3  . simvastatin (ZOCOR) 40 MG tablet TAKE 1 TABLET (40 MG TOTAL) BY MOUTH DAILY AT 6 PM. 90 tablet 1  . Tamsulosin HCl (FLOMAX) 0.4 MG CAPS Take 0.4 mg by mouth daily.     Marland Kitchen tiZANidine (ZANAFLEX) 4 MG tablet Take 1 tablet (4 mg total) by mouth every 8 (eight) hours as needed. 30 tablet 0  . valsartan-hydrochlorothiazide (DIOVAN-HCT) 80-12.5 MG tablet TAKE 1 TABLET BY MOUTH DAILY. GENERIC PLEASE 90 tablet 1  . albuterol (PROVENTIL HFA;VENTOLIN HFA) 108 (90 Base) MCG/ACT inhaler Inhale 2 puffs into the lungs every 6 (six) hours as needed for wheezing or shortness of breath. 1 Inhaler 0   No current facility-administered medications for this visit.    Allergies  Allergen Reactions  . Amoxicillin Swelling    Tongue sores  . Contrast Media [Iodinated Diagnostic Agents] Shortness Of Breath  . Lisinopril Cough    Health Maintenance Health Maintenance  Topic Date Due  . TETANUS/TDAP  01/29/2023  . COLONOSCOPY  10/01/2023  . INFLUENZA VACCINE  Completed  . Hepatitis C Screening  Completed  . PNA vac Low Risk Adult  Completed     Exam:  BP (!) 146/80   Pulse 71  Temp (!) 97.5 F (36.4 C) (Oral)   Ht 5\' 10"  (1.778 m)   Wt (!) 325 lb (147.4 kg)   SpO2 95%   BMI 46.63 kg/m  Gen: Well NAD HEENT: EOMI,  MMM no increased JVD Lungs: Normal work of breathing. CTABL Heart: RRR no MRG Abd: NABS, Soft. Nondistended, Nontender Exts: Brisk capillary refill, warm and well perfused. No edema   No results found for this or any previous visit (from the past 72 hour(s)). Dg Chest 2 View  Result Date: 10/29/2017 CLINICAL DATA:  Cough for several weeks, shortness of Breath EXAM: CHEST  2 VIEW COMPARISON:  01/19/2017 FINDINGS: Cardiac shadow is stable. Mild aortic calcifications are noted without aneurysmal dilatation. The lungs are well aerated bilaterally without  focal infiltrate or sizable effusion. Mild degenerative change thoracic spine is noted. IMPRESSION: No active cardiopulmonary disease. Electronically Signed   By: Inez Catalina M.D.   On: 10/29/2017 11:44      Assessment and Plan: 71 y.o. male with persistent cough and shortness of breath. Likely post viral cough however the shortness of breath causes unclear. Plan to use albuterol and Hycodan cough syrup for symptomatic control. I think the next step should probably be echocardiogram to assess for failure symptoms. 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   Follow up with PCP in 2 weeks.    Orders Placed This Encounter  Procedures  . DG Chest 2 View    Order Specific Question:   Reason for exam:    Answer:   Cough, assess intra-thoracic pathology    Order Specific Question:   Preferred imaging location?    Answer:   [facility]  . ECHOCARDIOGRAM COMPLETE    Standing Status:   Future    Standing Expiration Date:   01/28/2019    Order Specific Question:   Where should this test be performed    Answer:   [facility]    Order Specific Question:   Perflutren DEFINITY (image enhancing agent) should be administered unless hypersensitivity or allergy exist    Answer:   Administer Perflutren    Order Specific Question:   Expected Date:    Answer:   1 month   Meds ordered this encounter  Medications  . HYDROcodone-homatropine (HYCODAN) 5-1.5 MG/5ML syrup    Sig: Take 5 mLs by mouth every 8 (eight) hours as needed for cough.    Dispense:  120 mL    Refill:  0  . albuterol (PROVENTIL HFA;VENTOLIN HFA) 108 (90 Base) MCG/ACT inhaler    Sig: Inhale 2 puffs into the lungs every 6 (six) hours as needed for wheezing or shortness of breath.    Dispense:  1 Inhaler    Refill:  0     Discussed warning signs or symptoms. Please see discharge instructions. Patient expresses understanding.

## 2017-10-30 ENCOUNTER — Ambulatory Visit: Payer: Medicare Other | Admitting: Family Medicine

## 2017-11-12 ENCOUNTER — Encounter: Payer: Self-pay | Admitting: Family Medicine

## 2017-11-12 ENCOUNTER — Ambulatory Visit (INDEPENDENT_AMBULATORY_CARE_PROVIDER_SITE_OTHER): Payer: Medicare Other | Admitting: Family Medicine

## 2017-11-12 VITALS — BP 147/80 | HR 70 | Wt 322.0 lb

## 2017-11-12 DIAGNOSIS — I10 Essential (primary) hypertension: Secondary | ICD-10-CM

## 2017-11-12 DIAGNOSIS — R059 Cough, unspecified: Secondary | ICD-10-CM

## 2017-11-12 DIAGNOSIS — R05 Cough: Secondary | ICD-10-CM

## 2017-11-12 DIAGNOSIS — E65 Localized adiposity: Secondary | ICD-10-CM | POA: Diagnosis not present

## 2017-11-12 DIAGNOSIS — R7301 Impaired fasting glucose: Secondary | ICD-10-CM | POA: Diagnosis not present

## 2017-11-12 NOTE — Progress Notes (Signed)
Subjective:    Patient ID: Tracy Bennett, male    DOB: 1946/01/14, 71 y.o.   MRN: [id number]  HPI F/u cough and SOB.  71 year old male was originally seen November 20 for sinus and cough symptoms.  After couple weeks he did not prevent significantly and returned.  He did have a chest x-ray performed at that time which was essentially negative.  He was encouraged to follow-up today to make sure that he was improving.  There is also consideration that some of his symptoms could be cardiac in nature. He was given albuterol and cough syrup.  Overall he feels like he is about 80% better.  The shortness of breath seems to have resolved.  He said it really was rough for the last several weeks he did not even put up a lot of his Christmas lights and that he normally does.  He still using just a little small amount of the cough syrup at bedtime to help him sleep and to keep the cough from waking him up at night.  Does have a follow-up with cardiology scheduled on December 27, Dr. Caryl Comes.  Impaired fasting glucose-no increased thirst or urination. No symptoms consistent with hypoglycemia.  Hypertension- Pt denies chest pain, SOB, dizziness, or heart palpitations.  Taking meds as directed w/o problems.  Denies medication side effects.  Only on Diovan HCT.  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  He says is to the point where people are commenting on it.  He feels like it is actually possibly impacting his range of motion of his cervical spine that he has had 2 prior surgeries.  He says it is hard to lay flat now.  Review of Systems  BP (!) 147/80 (BP Location: Left Arm, Cuff Size: Large)   Pulse 70   Wt (!) 322 lb (146.1 kg)   SpO2 97%   BMI 46.20 kg/m     Allergies  Allergen Reactions  . Amoxicillin Swelling    Tongue sores  . Contrast Media [Iodinated Diagnostic Agents] Shortness Of Breath  . Lisinopril Cough    Past Medical  History:  Diagnosis Date  . GERD (gastroesophageal reflux disease)   . Hyperlipidemia   . Hypertension   . Kidney stones    x 4     Past Surgical History:  Procedure Laterality Date  . CARPAL TUNNEL RELEASE     Right   . CERVICAL FUSION  2000   C5-6  . FOOT TENDON SURGERY  11/14/2011   Dr. Sharol Given  . left knee surgery     ACL repair   . UMBILICAL HERNIA REPAIR      Social History   Socioeconomic History  . Marital status: Married    Spouse name: Rose   . Number of children: 1  . Years of education: Not on file  . Highest education level: Not on file  Social Needs  . Financial resource strain: Not on file  . Food insecurity - worry: Not on file  . Food insecurity - inability: Not on file  . Transportation needs - medical: Not on file  . Transportation needs - non-medical: Not on file  Occupational History  . Occupation: Retired     Comment: Print production planner   Tobacco Use  . Smoking status: Former Smoker    Last attempt to quit: 02/09/1980    Years since quitting: 37.7  . Smokeless tobacco: Former Network engineer  and Sexual Activity  . Alcohol use: Yes  . Drug use: No  . Sexual activity: Yes  Other Topics Concern  . Not on file  Social History Narrative   Golf about 2 times per week.  No regular exercise.     Family History  Problem Relation Age of Onset  . COPD Mother   . Hypertension Mother   . Emphysema Mother     Outpatient Encounter Medications as of 11/12/2017  Medication Sig  . albuterol (PROVENTIL HFA;VENTOLIN HFA) 108 (90 Base) MCG/ACT inhaler Inhale 2 puffs into the lungs every 6 (six) hours as needed for wheezing or shortness of breath.  . betamethasone dipropionate (DIPROLENE) 0.05 % ointment Apply to arms and affected areas twice a day. Avoid contact with face.  . finasteride (PROSCAR) 5 MG tablet Take 1 tablet (5 mg total) by mouth daily.  Marland Kitchen HYDROcodone-homatropine (HYCODAN) 5-1.5 MG/5ML syrup Take 5 mLs by mouth every 8 (eight) hours as needed for  cough.  . loratadine (CLARITIN) 10 MG tablet Take 10 mg by mouth daily.  Marland Kitchen oxybutynin (DITROPAN-XL) 10 MG 24 hr tablet   . pantoprazole (PROTONIX) 40 MG tablet Take 1 tablet (40 mg total) by mouth daily.  . simvastatin (ZOCOR) 40 MG tablet TAKE 1 TABLET (40 MG TOTAL) BY MOUTH DAILY AT 6 PM.  . Tamsulosin HCl (FLOMAX) 0.4 MG CAPS Take 0.4 mg by mouth daily.   . valsartan-hydrochlorothiazide (DIOVAN-HCT) 80-12.5 MG tablet TAKE 1 TABLET BY MOUTH DAILY. GENERIC PLEASE  . [DISCONTINUED] tiZANidine (ZANAFLEX) 4 MG tablet Take 1 tablet (4 mg total) by mouth every 8 (eight) hours as needed.   No facility-administered encounter medications on file as of 11/12/2017.          Objective:   Physical Exam  Constitutional: He is oriented to person, place, and time. He appears well-developed and well-nourished.  HENT:  Head: Normocephalic and atraumatic.  Cardiovascular: Normal rate, regular rhythm and normal heart sounds.  Pulmonary/Chest: Effort normal and breath sounds normal.  Neurological: He is alert and oriented to person, place, and time.  Skin: Skin is warm and dry.  Psychiatric: He has a normal mood and affect. His behavior is normal.        Assessment & Plan:  Cough/SOB - viral URI - he if finally 80% better.  Explained that it could still take another week or 2 for his symptoms to completely resolve and he needs to go ahead and start weaning the cough syrup.  IFG - due to recheck A1C.  Hasn't follow up at 6 month mark.  With results.  Continue to work on healthy diet and regular exercise.  She is down 3 pounds which is fantastic.  HTN -she is elevated today but he says he did not take his medicine yet.  When he was here a couple weeks ago his blood pressure looks great.  Due for BMP.    Buffalo hump -we discussed the option of referral to gen surgery for a consultation for possible surgical removal.

## 2017-11-13 LAB — BASIC METABOLIC PANEL WITH GFR
BUN/Creatinine Ratio: 13 (calc) (ref 6–22)
BUN: 16 mg/dL (ref 7–25)
CALCIUM: 9.7 mg/dL (ref 8.6–10.3)
CHLORIDE: 105 mmol/L (ref 98–110)
CO2: 27 mmol/L (ref 20–32)
Creat: 1.24 mg/dL — ABNORMAL HIGH (ref 0.70–1.18)
GFR, EST AFRICAN AMERICAN: 67 mL/min/{1.73_m2} (ref >=60)
GFR, Est Non African American: 58 mL/min/{1.73_m2} — ABNORMAL LOW (ref >=60)
Glucose, Bld: 119 mg/dL — ABNORMAL HIGH (ref 65–99)
POTASSIUM: 4.1 mmol/L (ref 3.5–5.3)
Sodium: 140 mmol/L (ref 135–146)

## 2017-11-13 LAB — HEMOGLOBIN A1C
EAG (MMOL/L): 7.6 (calc)
Hgb A1c MFr Bld: 6.4 % of total Hgb — ABNORMAL HIGH (ref <5.7)
MEAN PLASMA GLUCOSE: 137 (calc)

## 2017-11-21 ENCOUNTER — Other Ambulatory Visit: Payer: Self-pay | Admitting: Family Medicine

## 2017-11-22 ENCOUNTER — Ambulatory Visit (INDEPENDENT_AMBULATORY_CARE_PROVIDER_SITE_OTHER): Payer: Medicare Other | Admitting: Internal Medicine

## 2017-11-22 VITALS — BP 138/80 | HR 72 | Ht 70.0 in | Wt 326.0 lb

## 2017-11-22 DIAGNOSIS — E785 Hyperlipidemia, unspecified: Secondary | ICD-10-CM

## 2017-11-22 DIAGNOSIS — I493 Ventricular premature depolarization: Secondary | ICD-10-CM

## 2017-11-22 DIAGNOSIS — I10 Essential (primary) hypertension: Secondary | ICD-10-CM

## 2017-11-22 DIAGNOSIS — R002 Palpitations: Secondary | ICD-10-CM

## 2017-11-22 MED ORDER — FUROSEMIDE 20 MG PO TABS: 20.0000 mg | ORAL_TABLET | ORAL | 0 refills | Status: DC

## 2017-11-22 NOTE — Progress Notes (Addendum)
Patient Care Team: Hali Marry, MD as PCP - General (Family Medicine) Sonnie Alamo, MD as Referring Physician (Gastroenterology) Bethann Punches, MD as Referring Physician (Urology) Deboraha Sprang, MD as Consulting Physician (Cardiology) Magnus Sinning, MD as Consulting Physician   HPI  Tracy Bennett is a 71 y.o. male Seen in follow-up for PVCs of review from the right ventricular outflow tract. He had no associated symptoms.  There also been a question at the initial consultation regarding long QT syndrome for which I found no evidence.   The patient denies chest pain There have been no palpitations, lightheadedness or syncope.   Wieght is up consideably and activity is limited by knees and dyspnea  Records and Results Reviewed   DATE TEST    11/16 Echo    EF 60 %   11/17 Echo    EF 60-65 %           Date Cr K LDL  9/16   75  12/18 1.24 4.1            Past Medical History:  Diagnosis Date  . GERD (gastroesophageal reflux disease)   . Hyperlipidemia   . Hypertension   . Kidney stones    x 4     Past Surgical History:  Procedure Laterality Date  . CARPAL TUNNEL RELEASE     Right   . CERVICAL FUSION  2000   C5-6  . FOOT TENDON SURGERY  11/14/2011   Dr. Sharol Given  . left knee surgery     ACL repair   . UMBILICAL HERNIA REPAIR      Current Outpatient Medications  Medication Sig Dispense Refill  . albuterol (PROVENTIL HFA;VENTOLIN HFA) 108 (90 Base) MCG/ACT inhaler TAKE 2 PUFFS BY MOUTH EVERY 6 HOURS AS NEEDED FOR WHEEZE OR SHORTNESS OF BREATH 18 Inhaler 0  . betamethasone dipropionate (DIPROLENE) 0.05 % ointment Apply to arms and affected areas twice a day. Avoid contact with face. 15 g 0  . finasteride (PROSCAR) 5 MG tablet Take 1 tablet (5 mg total) by mouth daily. 30 tablet 1  . HYDROcodone-homatropine (HYCODAN) 5-1.5 MG/5ML syrup Take 5 mLs by mouth every 8 (eight) hours as needed for cough. 120 mL 0  . loratadine (CLARITIN) 10 MG  tablet Take 10 mg by mouth daily.    Marland Kitchen oxybutynin (DITROPAN-XL) 10 MG 24 hr tablet     . pantoprazole (PROTONIX) 40 MG tablet Take 1 tablet (40 mg total) by mouth daily. 90 tablet 3  . simvastatin (ZOCOR) 40 MG tablet TAKE 1 TABLET (40 MG TOTAL) BY MOUTH DAILY AT 6 PM. 90 tablet 1  . Tamsulosin HCl (FLOMAX) 0.4 MG CAPS Take 0.4 mg by mouth daily.     . valsartan-hydrochlorothiazide (DIOVAN-HCT) 80-12.5 MG tablet TAKE 1 TABLET BY MOUTH DAILY. GENERIC PLEASE 90 tablet 1   No current facility-administered medications for this visit.     Allergies  Allergen Reactions  . Amoxicillin Swelling    Tongue sores  . Contrast Media [Iodinated Diagnostic Agents] Shortness Of Breath  . Lisinopril Cough      Review of Systems negative except from HPI and PMH  Physical Exam BP 138/80   Pulse 72   Ht 5\' 10"  (1.778 m)   Wt (!) 326 lb (147.9 kg)   SpO2 96%   BMI 46.78 kg/m  Well developed and well nourished in no acute distress HENT normal E scleral and icterus clear Neck Supple JVP flat; carotids brisk  and full Clear to ausculation  Regular rate and rhythm, no murmurs gallops or rub Soft with active bowel sounds No clubbing cyanosis 2+ Edema Alert and oriented, grossly normal motor and sensory function Skin Warm and Dry  ECG demonstrates sinus rhythm at 69 Intervals 14/08/40 PVCs with a left bundle inferior axis morphology transitioning and V3 and one couplet  Assessment and  Plan  PVCs-RVOT  Hypertension  Morbidly obese  Continues with frequent but asymptomatic PVCs.  No effect on LV function discern last year.  We will plan to repeat it next year.  Encourage weight loss discussed exercise including water aerobics.  Blood pressures at home have been reasonably controlled.  We spent more than 50% of our >25 min visit in face to face counseling regarding the above    Current medicines are reviewed at length with the patient today .  The patient does not  have concerns  regarding medicines.

## 2017-11-22 NOTE — Patient Instructions (Addendum)
Medication Instructions: Your physician has recommended you make the following change in your medication:  -1) START Furosemide (Lasix) 20 mg - Take 1 tablet (20 mg) by mouth every other day for 4 DAYS. You make take it AS NEEDED for swelling after the 4 doses   Labwork: Your physician has recommended that you have lab work today - Lipid Panel with Direct LDL  Procedures/Testing: None Ordered  Follow-Up: Your physician wants you to follow-up in: 1 YEAR with Dr. Caryl Comes. You will receive a reminder letter in the mail two months in advance. If you don't receive a letter, please call our office to schedule the follow-up appointment.   If you need a refill on your cardiac medications before your next appointment, please call your pharmacy.

## 2017-11-23 LAB — LP+LDL DIRECT
CHOLESTEROL TOTAL: 136 mg/dL (ref 100–199)
HDL: 34 mg/dL — ABNORMAL LOW (ref >39)
LDL CALC: 53 mg/dL (ref 0–99)
LDL Direct: 80 mg/dL (ref 0–99)
Triglycerides: 244 mg/dL — ABNORMAL HIGH (ref 0–149)
VLDL Cholesterol Cal: 49 mg/dL — ABNORMAL HIGH (ref 5–40)

## 2017-11-26 ENCOUNTER — Telehealth: Payer: Self-pay

## 2017-11-26 NOTE — Telephone Encounter (Signed)
Pt is aware and agreeable to LDL being within range but pt was concerned with how high his Triglycerides were in comparison to last results. I told him he is more then welcome to talk to PCP about that. He is agreeable. He viewed results via [employer]

## 2017-11-28 ENCOUNTER — Ambulatory Visit: Payer: Self-pay | Admitting: Surgery

## 2017-11-28 DIAGNOSIS — R222 Localized swelling, mass and lump, trunk: Secondary | ICD-10-CM | POA: Diagnosis not present

## 2017-11-28 NOTE — H&P (Signed)
[name] Documented: 11/28/2017 1:53 PM Location: [facility] Patient #: [id number] DOB: [birth year] Married / Language: Tracy Bennett / Race: White Male  History of Present Illness Tracy Bennett; 11/28/2017 3:29 PM) Patient words: Patient sent at Tracy Bennett request of Dr. Charise Carwin for mass over upper central back. His is grown slowly over Tracy Bennett last 7 day years. It does interfere with his range of motion. He has had previous multiple spinal fusions. Tracy Bennett mass is larger and causing mild discomfort. It is mobile. There is no redness or drainage.  Tracy Bennett patient is a 72 year old male.   Past Surgical History (Tracy Bennett, [place]; 11/28/2017 1:53 PM) Cataract Surgery Bilateral. Foot Surgery Right. Knee Surgery Left. Spinal Surgery - Neck  Diagnostic Studies History (Tracy Bennett, [place]; 11/28/2017 1:53 PM) Colonoscopy 1-5 years ago  Allergies (Tracy Bennett, [place]; 11/28/2017 1:57 PM) Amoxicillin *PENICILLINS* Dyes contrast media (iodinated diagnostic agents) Lisinopril *CHEMICALS*  Medication History (Tracy Bennett, [place]; 11/28/2017 1:57 PM) Azithromycin (250MG  Tablet, Oral) Active. Furosemide (20MG  Tablet, Oral) Active. Ventolin HFA (108 (90 Base)MCG/ACT Aerosol Soln, Inhalation) Active. Valsartan-Hydrochlorothiazide (80-12.5MG  Tablet, Oral) Active. Toviaz (8MG  Tablet ER 24HR, Oral) Active. Simvastatin (40MG  Tablet, Oral) Active. Pantoprazole Sodium (40MG  Tablet DR, Oral) Active. Finasteride (5MG  Tablet, Oral) Active. PredniSONE (10MG  Tablet, Oral) Active. Tamsulosin HCl (0.4MG  Capsule, Oral) Active. Medications Reconciled  Social History (Tracy Bennett, [place]; 11/28/2017 1:53 PM) Alcohol use Moderate alcohol use. Caffeine use Carbonated beverages, Coffee, Tea. No drug use Tobacco use Former smoker.  Family History (Tracy Bennett, [place]; 11/28/2017 1:53 PM) Hypertension Mother. Respiratory Condition Mother.  Other Problems (Tracy Bennett,  [place]; 11/28/2017 1:53 PM) Back Pain Hypercholesterolemia Kidney Stone Sleep Apnea Umbilical Hernia Repair     Review of Systems (Tracy Bennett; 11/28/2017 1:53 PM) General Not Present- Appetite Loss, Chills, Fatigue, Fever, Night Sweats, Weight Gain and Weight Loss. Skin Not Present- Change in Wart/Mole, Dryness, Hives, Jaundice, New Lesions, Non-Healing Wounds, Rash and Ulcer. HEENT Not Present- Earache, Hearing Loss, Hoarseness, Nose Bleed, Oral Ulcers, Ringing in Tracy Bennett Ears, Seasonal Allergies, Sinus Pain, Sore Throat, Visual Disturbances, Wears glasses/contact lenses and Yellow Eyes. Respiratory Not Present- Bloody sputum, Chronic Cough, Difficulty Breathing, Snoring and Wheezing. Breast Not Present- Breast Mass, Breast Pain, Nipple Discharge and Skin Changes. Cardiovascular Not Present- Chest Pain, Difficulty Breathing Lying Down, Leg Cramps, Palpitations, Rapid Heart Rate, Shortness of Breath and Swelling of Extremities. Gastrointestinal Not Present- Abdominal Pain, Bloating, Bloody Stool, Change in Bowel Habits, Chronic diarrhea, Constipation, Difficulty Swallowing, Excessive gas, Gets full quickly at meals, Hemorrhoids, Indigestion, Nausea, Rectal Pain and Vomiting. Male Genitourinary Not Present- Blood in Urine, Change in Urinary Stream, Frequency, Impotence, Nocturia, Painful Urination, Urgency and Urine Leakage. Musculoskeletal Present- Swelling of Extremities. Not Present- Back Pain, Joint Pain, Joint Stiffness, Muscle Pain and Muscle Weakness. Neurological Not Present- Decreased Memory, Fainting, Headaches, Numbness, Seizures, Tingling, Tremor, Trouble walking and Weakness. Psychiatric Not Present- Anxiety, Bipolar, Change in Sleep Pattern, Depression, Fearful and Frequent crying. Endocrine Not Present- Cold Intolerance, Excessive Hunger, Hair Changes, Heat Intolerance, Hot flashes and New Diabetes. Hematology Not Present- Blood Thinners, Easy Bruising, Excessive bleeding,  Gland problems, HIV and Persistent Infections.  Vitals (Tracy Bennett; 11/28/2017 1:55 PM) 11/28/2017 1:54 PM Weight: 324.2 lb Height: 70in Body Surface Area: 2.56 m Body Mass Index: 46.52 kg/m  Temp.: 98.74F  Pulse: 77 (Regular)  BP: 126/82 (Sitting, Left Arm, Standard)      Physical Exam (Tracy Bennett; 11/28/2017 3:30  PM)  General Mental Status-Alert. General Appearance-Consistent with stated age. Hydration-Well hydrated. Voice-Normal.  Integumentary Note: 20 cm x 20 cm fatty mass upper central back at Tracy Bennett base of his neck. It is mobile. It is not fixed to underlying structures.  Chest and Lung Exam Chest and lung exam reveals -quiet, even and easy respiratory effort with no use of accessory muscles and on auscultation, normal breath sounds, no adventitious sounds and normal vocal resonance. Inspection Chest Wall - Normal. Back - normal.  Cardiovascular Cardiovascular examination reveals -normal heart sounds, regular rate and rhythm with no murmurs and normal pedal pulses bilaterally.  Neurologic Neurologic evaluation reveals -alert and oriented x 3 with no impairment of recent or remote memory. Mental Status-Normal.  Musculoskeletal Normal Exam - Left-Upper Extremity Strength Normal and Lower Extremity Strength Normal. Normal Exam - Right-Upper Extremity Strength Normal and Lower Extremity Strength Normal.    Assessment & Plan (Tracy Bennett; 11/28/2017 3:31 PM)  MASS OF SUBCUTANEOUS TISSUE OF BACK (R22.2) Impression: Discussed options of observation versus excision. It is causing symptoms and is on call for Tracy Bennett patient to have. It is mobile and not fixed and he neurologically is intact therefore do not recommend imaging studies since his physical large subcutaneous fatty mass possibly lipoma. Risks and benefits of surgery discussed. Observations discussed. I do not feel core biopsy will be helpful in this  circumstance.  Current Plans Tracy Bennett pathophysiology of skin & subcutaneous masses was discussed. Natural history risks without surgery were discussed. I recommended surgery to remove Tracy Bennett mass. I explained Tracy Bennett technique of removal with use of local anesthesia & possible need for more aggressive sedation/anesthesia for patient comfort.  Risks such as bleeding, infection, wound breakdown, heart attack, death, and other risks were discussed. I noted a good likelihood this will help address Tracy Bennett problem. Possibility that this will not correct all symptoms was explained. Possibility of regrowth/recurrence of Tracy Bennett mass was discussed. We will work to minimize complications. Questions were answered. Tracy Bennett patient expresses understanding & wishes to proceed with surgery.  Pt Education - [facility] Free Text Education/Instructions: discussed with patient and provided information.

## 2017-12-20 ENCOUNTER — Other Ambulatory Visit: Payer: Self-pay | Admitting: Internal Medicine

## 2017-12-26 DIAGNOSIS — B369 Superficial mycosis, unspecified: Secondary | ICD-10-CM | POA: Diagnosis not present

## 2018-01-18 NOTE — Pre-Procedure Instructions (Addendum)
Tracy Bennett  01/18/2018      [id number] - [place], [place] - [address] [place] [address] Phone: [phone] Fax: [phone]    Your procedure is scheduled on Thurs. Feb.28.  Report to [facility] Admitting at 7:30 A.M.  Call this number if you have problems the morning of surgery:  [phone]   Remember:  Do not eat food or drink liquids after midnight on Wed. Feb. 27           DRINK 1 BOTTLE OF ENSURE BEFORE YOU LEAVE THE HOUSE THE DAY OF SURGERY.                                                             Take these medicines the morning of surgery with A SIP OF WATER : fesoterodine(toviaz), finasteride (proscar), loratadine (clairtin) if needed, pantoprazole (protonix), tamsulosin (flomaz),               7 days prior to surgery STOP taking any Aspirin(unless otherwise instructed by your surgeon), Aleve, Naproxen, Ibuprofen, Motrin, Advil, Goody's, BC's, all herbal medications, fish oil, and all vitamins   Do not wear jewelry.  Do not wear lotions, powders, or perfumes, or deodorant.  Do not shave 48 hours prior to surgery.  Men may shave face and neck.  Do not bring valuables to the hospital.  [facility] is not responsible for any belongings or valuables.  Contacts, dentures or bridgework may not be worn into surgery.  Leave your suitcase in the car.  After surgery it may be brought to your room.  For patients admitted to the hospital, discharge time will be determined by your treatment team.  Patients discharged the day of surgery will not be allowed to drive home.    Special instructions:   [facility]- Preparing For Surgery  Before surgery, you can play an important role. Because skin is not sterile, your skin needs to be as free of germs as possible. You can reduce the number of germs on your skin by washing with CHG (chlorahexidine gluconate) Soap before surgery.  CHG is an antiseptic cleaner which  kills germs and bonds with the skin to continue killing germs even after washing.  Please do not use if you have an allergy to CHG or antibacterial soaps. If your skin becomes reddened/irritated stop using the CHG.  Do not shave (including legs and underarms) for at least 48 hours prior to first CHG shower. It is OK to shave your face.  Please follow these instructions carefully.   1. Shower the NIGHT BEFORE SURGERY and the MORNING OF SURGERY with CHG.   2. If you chose to wash your hair, wash your hair first as usual with your normal shampoo.  3. After you shampoo, rinse your hair and body thoroughly to remove the shampoo.  4. Use CHG as you would any other liquid soap. You can apply CHG directly to the skin and wash gently with a scrungie or a clean washcloth.   5. Apply the CHG Soap to your body ONLY FROM THE NECK DOWN.  Do not use on open wounds or open sores. Avoid contact with your eyes, ears, mouth and genitals (private parts). Wash Face and genitals (private parts)  with  your normal soap.  6. Wash thoroughly, paying special attention to the area where your surgery will be performed.  7. Thoroughly rinse your body with warm water from the neck down.  8. DO NOT shower/wash with your normal soap after using and rinsing off the CHG Soap.  9. Pat yourself dry with a CLEAN TOWEL.  10. Wear CLEAN PAJAMAS to bed the night before surgery, wear comfortable clothes the morning of surgery  11. Place CLEAN SHEETS on your bed the night of your first shower and DO NOT SLEEP WITH PETS.    Day of Surgery: Do not apply any deodorants/lotions. Please wear clean clothes to the hospital/surgery center.      Please read over the following fact sheets that you were given. Coughing and Deep Breathing and Surgical Site Infection Prevention

## 2018-01-21 ENCOUNTER — Encounter (HOSPITAL_COMMUNITY)
Admission: RE | Admit: 2018-01-21 | Discharge: 2018-01-21 | Disposition: A | Discharge status: Home / Self Care | Payer: Medicare Other | Source: Ambulatory Visit | Attending: Surgery | Admitting: Surgery

## 2018-01-21 ENCOUNTER — Other Ambulatory Visit: Payer: Self-pay

## 2018-01-21 ENCOUNTER — Encounter (HOSPITAL_COMMUNITY): Payer: Self-pay

## 2018-01-21 DIAGNOSIS — K219 Gastro-esophageal reflux disease without esophagitis: Secondary | ICD-10-CM | POA: Diagnosis not present

## 2018-01-21 DIAGNOSIS — E7800 Pure hypercholesterolemia, unspecified: Secondary | ICD-10-CM | POA: Diagnosis not present

## 2018-01-21 DIAGNOSIS — Z79899 Other long term (current) drug therapy: Secondary | ICD-10-CM | POA: Diagnosis not present

## 2018-01-21 DIAGNOSIS — Z880 Allergy status to penicillin: Secondary | ICD-10-CM | POA: Diagnosis not present

## 2018-01-21 DIAGNOSIS — G4733 Obstructive sleep apnea (adult) (pediatric): Secondary | ICD-10-CM | POA: Diagnosis not present

## 2018-01-21 DIAGNOSIS — I493 Ventricular premature depolarization: Secondary | ICD-10-CM | POA: Diagnosis not present

## 2018-01-21 DIAGNOSIS — Z87891 Personal history of nicotine dependence: Secondary | ICD-10-CM | POA: Diagnosis not present

## 2018-01-21 DIAGNOSIS — Z91041 Radiographic dye allergy status: Secondary | ICD-10-CM | POA: Diagnosis not present

## 2018-01-21 DIAGNOSIS — I10 Essential (primary) hypertension: Secondary | ICD-10-CM | POA: Diagnosis not present

## 2018-01-21 DIAGNOSIS — D171 Benign lipomatous neoplasm of skin and subcutaneous tissue of trunk: Secondary | ICD-10-CM | POA: Diagnosis not present

## 2018-01-21 HISTORY — DX: Frequency of micturition: R35.0

## 2018-01-21 HISTORY — DX: Sleep apnea, unspecified: G47.30

## 2018-01-21 HISTORY — DX: Personal history of urinary calculi: Z87.442

## 2018-01-21 HISTORY — DX: Dyspnea, unspecified: R06.00

## 2018-01-21 HISTORY — DX: Cardiac arrhythmia, unspecified: I49.9

## 2018-01-21 LAB — COMPREHENSIVE METABOLIC PANEL
ALK PHOS: 48 U/L (ref 38–126)
ALT: 44 U/L (ref 17–63)
ANION GAP: 10 (ref 5–15)
AST: 42 U/L — ABNORMAL HIGH (ref 15–41)
Albumin: 3.7 g/dL (ref 3.5–5.0)
BUN: 10 mg/dL (ref 6–20)
CALCIUM: 9.4 mg/dL (ref 8.9–10.3)
CO2: 24 mmol/L (ref 22–32)
Chloride: 102 mmol/L (ref 101–111)
Creatinine, Ser: 1.24 mg/dL (ref 0.61–1.24)
GFR calc Af Amer: >60 mL/min (ref >60)
GFR, EST NON AFRICAN AMERICAN: 57 mL/min — AB (ref >60)
GLUCOSE: 120 mg/dL — AB (ref 65–99)
POTASSIUM: 3.8 mmol/L (ref 3.5–5.1)
Sodium: 136 mmol/L (ref 135–145)
TOTAL PROTEIN: 6.6 g/dL (ref 6.5–8.1)
Total Bilirubin: 1.2 mg/dL (ref 0.3–1.2)

## 2018-01-21 LAB — CBC WITH DIFFERENTIAL/PLATELET
Basophils Absolute: 0 10*3/uL (ref 0.0–0.1)
Basophils Relative: 0 %
Eosinophils Absolute: 0.2 10*3/uL (ref 0.0–0.7)
Eosinophils Relative: 2 %
HEMATOCRIT: 46.6 % (ref 39.0–52.0)
HEMOGLOBIN: 15.5 g/dL (ref 13.0–17.0)
LYMPHS ABS: 1.8 10*3/uL (ref 0.7–4.0)
LYMPHS PCT: 19 %
MCH: 29.5 pg (ref 26.0–34.0)
MCHC: 33.3 g/dL (ref 30.0–36.0)
MCV: 88.8 fL (ref 78.0–100.0)
MONO ABS: 0.7 10*3/uL (ref 0.1–1.0)
MONOS PCT: 7 %
NEUTROS ABS: 6.5 10*3/uL (ref 1.7–7.7)
NEUTROS PCT: 72 %
Platelets: 161 10*3/uL (ref 150–400)
RBC: 5.25 MIL/uL (ref 4.22–5.81)
RDW: 13.3 % (ref 11.5–15.5)
WBC: 9.2 10*3/uL (ref 4.0–10.5)

## 2018-01-21 NOTE — Progress Notes (Signed)
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  Last office visit 11/22/17; 1 year f/u recommended.   PMH includes:  HTN, hyperlipidemia, frequent PVCs, hyperlipidemia, OSA, GERD.  Former smoker.  BMI 47.   Anesthesia history: S/p cervical fusion (3-4 and C4-5 in 09/2015; C5-6 and C6-7 about 10-12 years ago).   Medications include: Lasix, Protonix, simvastatin, valsartan-HCTZ  Preoperative labs reviewed.    CXR 10/29/17: No active cardiopulmonary disease.  EKG 11/22/17: Sinus rhythm with possible PACs with aberrant conduction.  ST and T wave abnormality, consider anterolateral ischemia  Echo 10/17/16:  - Left ventricle: The cavity size was normal. There was mild concentric hypertrophy. Systolic function was normal. The estimated ejection fraction was in the range of 60% to 65%. Wall motion was normal; there were no regional wall motion abnormalities. Features are consistent with a pseudonormal left ventricular filling pattern, with concomitant abnormal relaxation and increased filling pressure (grade 2 diastolic dysfunction). - Aortic valve: Transvalvular velocity was within the normal range. There was no stenosis. There was no regurgitation. - Aorta: Ascending aortic diameter: 37 mm (S). - Ascending aorta: The ascending aorta was mildly dilated. - Mitral valve: Transvalvular velocity was within the normal range. There was no evidence for stenosis. There was no regurgitation. - Right ventricle: The cavity size was normal. Wall thickness was normal. Systolic function was normal. - Atrial septum: There was increased thickness of the septum, consistent with lipomatous hypertrophy. No defect or patent foramen ovale was identified by color flow Doppler. - Tricuspid valve: There was no regurgitation. -  Pericardium, extracardiac: A small pericardial effusion was identified.  Carotid duplex 10/24/11:  - No evidence of significant carotid stenosis.  Minimal plaque present at the level of the right carotid bulb.  Pt has a large mass at the base of his neck on his back that impedes his ability to extend his neck.  Can open mouth sufficiently. Dr. Linna Caprice evaluated pt in pre-admission testing and advised pt likely to be successfully intubated using video laryngoscope.     If no changes, I anticipate pt can proceed with surgery as scheduled.   Willeen Cass, FNP-BC [facility]/Anesthesiology Phone: [phone] 01/21/2018 2:01 PM

## 2018-01-24 ENCOUNTER — Encounter (HOSPITAL_COMMUNITY): Payer: Self-pay | Admitting: Anesthesiology

## 2018-01-24 ENCOUNTER — Encounter (HOSPITAL_COMMUNITY)
Admission: RE | Disposition: A | Discharge status: Home / Self Care | Payer: Self-pay | Source: Ambulatory Visit | Attending: Surgery

## 2018-01-24 ENCOUNTER — Ambulatory Visit (HOSPITAL_COMMUNITY): Payer: Medicare Other | Admitting: Anesthesiology

## 2018-01-24 ENCOUNTER — Ambulatory Visit (HOSPITAL_COMMUNITY)
Admission: RE | Admit: 2018-01-24 | Discharge: 2018-01-24 | Disposition: A | Discharge status: Home / Self Care | Payer: Medicare Other | Source: Ambulatory Visit | Attending: Surgery | Admitting: Surgery

## 2018-01-24 ENCOUNTER — Ambulatory Visit (HOSPITAL_COMMUNITY): Payer: Medicare Other | Admitting: Vascular Surgery

## 2018-01-24 DIAGNOSIS — Z880 Allergy status to penicillin: Secondary | ICD-10-CM | POA: Insufficient documentation

## 2018-01-24 DIAGNOSIS — Z79899 Other long term (current) drug therapy: Secondary | ICD-10-CM | POA: Insufficient documentation

## 2018-01-24 DIAGNOSIS — I129 Hypertensive chronic kidney disease with stage 1 through stage 4 chronic kidney disease, or unspecified chronic kidney disease: Secondary | ICD-10-CM | POA: Diagnosis not present

## 2018-01-24 DIAGNOSIS — Z91041 Radiographic dye allergy status: Secondary | ICD-10-CM | POA: Insufficient documentation

## 2018-01-24 DIAGNOSIS — I10 Essential (primary) hypertension: Secondary | ICD-10-CM | POA: Diagnosis not present

## 2018-01-24 DIAGNOSIS — I493 Ventricular premature depolarization: Secondary | ICD-10-CM | POA: Insufficient documentation

## 2018-01-24 DIAGNOSIS — N183 Chronic kidney disease, stage 3 (moderate): Secondary | ICD-10-CM | POA: Diagnosis not present

## 2018-01-24 DIAGNOSIS — E785 Hyperlipidemia, unspecified: Secondary | ICD-10-CM | POA: Diagnosis not present

## 2018-01-24 DIAGNOSIS — D171 Benign lipomatous neoplasm of skin and subcutaneous tissue of trunk: Secondary | ICD-10-CM | POA: Insufficient documentation

## 2018-01-24 DIAGNOSIS — K219 Gastro-esophageal reflux disease without esophagitis: Secondary | ICD-10-CM | POA: Diagnosis not present

## 2018-01-24 DIAGNOSIS — E7800 Pure hypercholesterolemia, unspecified: Secondary | ICD-10-CM | POA: Insufficient documentation

## 2018-01-24 DIAGNOSIS — Z87891 Personal history of nicotine dependence: Secondary | ICD-10-CM | POA: Insufficient documentation

## 2018-01-24 DIAGNOSIS — G4733 Obstructive sleep apnea (adult) (pediatric): Secondary | ICD-10-CM | POA: Diagnosis not present

## 2018-01-24 DIAGNOSIS — R222 Localized swelling, mass and lump, trunk: Secondary | ICD-10-CM | POA: Diagnosis not present

## 2018-01-24 HISTORY — PX: MASS EXCISION: SHX2000

## 2018-01-24 SURGERY — EXCISION MASS
Anesthesia: General | Site: Back

## 2018-01-24 MED ORDER — DEXAMETHASONE SODIUM PHOSPHATE 10 MG/ML IJ SOLN
INTRAMUSCULAR | Status: DC | PRN
  Administered 2018-01-24: 5 mg via INTRAVENOUS

## 2018-01-24 MED ORDER — CHLORHEXIDINE GLUCONATE CLOTH 2 % EX PADS: 6.0000 | MEDICATED_PAD | Freq: Once | CUTANEOUS | Status: DC

## 2018-01-24 MED ORDER — EPHEDRINE 5 MG/ML INJ
INTRAVENOUS | Status: AC
  Filled 2018-01-24: qty 10

## 2018-01-24 MED ORDER — ACETAMINOPHEN 500 MG PO TABS
1000.0000 mg | ORAL_TABLET | ORAL | Status: AC
  Administered 2018-01-24: 1000 mg via ORAL
  Filled 2018-01-24: qty 2

## 2018-01-24 MED ORDER — BUPIVACAINE-EPINEPHRINE 0.5% -1:200000 IJ SOLN
INTRAMUSCULAR | Status: DC | PRN
  Administered 2018-01-24: 20 mL

## 2018-01-24 MED ORDER — DEXAMETHASONE SODIUM PHOSPHATE 10 MG/ML IJ SOLN
INTRAMUSCULAR | Status: AC
  Filled 2018-01-24: qty 1

## 2018-01-24 MED ORDER — DEXTROSE 5 % IV SOLN
INTRAVENOUS | Status: DC | PRN
  Administered 2018-01-24: 10 ug/min via INTRAVENOUS

## 2018-01-24 MED ORDER — FENTANYL CITRATE (PF) 100 MCG/2ML IJ SOLN
INTRAMUSCULAR | Status: DC | PRN
  Administered 2018-01-24 (×3): 50 ug via INTRAVENOUS

## 2018-01-24 MED ORDER — FENTANYL CITRATE (PF) 250 MCG/5ML IJ SOLN
INTRAMUSCULAR | Status: AC
  Filled 2018-01-24: qty 5

## 2018-01-24 MED ORDER — GABAPENTIN 300 MG PO CAPS
300.0000 mg | ORAL_CAPSULE | ORAL | Status: AC
  Administered 2018-01-24: 300 mg via ORAL
  Filled 2018-01-24: qty 1

## 2018-01-24 MED ORDER — CLINDAMYCIN PHOSPHATE 900 MG/50ML IV SOLN
INTRAVENOUS | Status: AC
  Filled 2018-01-24: qty 50

## 2018-01-24 MED ORDER — PROPOFOL 10 MG/ML IV BOLUS
INTRAVENOUS | Status: DC | PRN
  Administered 2018-01-24: 170 mg via INTRAVENOUS

## 2018-01-24 MED ORDER — SUGAMMADEX SODIUM 200 MG/2ML IV SOLN
INTRAVENOUS | Status: AC
  Filled 2018-01-24: qty 2

## 2018-01-24 MED ORDER — IBUPROFEN 800 MG PO TABS
800.0000 mg | ORAL_TABLET | Freq: Three times a day (TID) | ORAL | 0 refills | Status: DC | PRN
Start: 2018-01-24 — End: 2019-05-22

## 2018-01-24 MED ORDER — PROPOFOL 10 MG/ML IV BOLUS
INTRAVENOUS | Status: AC
  Filled 2018-01-24: qty 20

## 2018-01-24 MED ORDER — EPHEDRINE SULFATE-NACL 50-0.9 MG/10ML-% IV SOSY
PREFILLED_SYRINGE | INTRAVENOUS | Status: DC | PRN
  Administered 2018-01-24: 10 mg via INTRAVENOUS

## 2018-01-24 MED ORDER — CIPROFLOXACIN IN D5W 400 MG/200ML IV SOLN: 400.0000 mg | INTRAVENOUS | Status: DC

## 2018-01-24 MED ORDER — CLINDAMYCIN PHOSPHATE 900 MG/50ML IV SOLN
900.0000 mg | Freq: Once | INTRAVENOUS | Status: AC
  Administered 2018-01-24: 900 mg via INTRAVENOUS

## 2018-01-24 MED ORDER — OXYCODONE HCL 5 MG PO TABS
5.0000 mg | ORAL_TABLET | Freq: Four times a day (QID) | ORAL | 0 refills | Status: DC | PRN
Start: 2018-01-24 — End: 2018-02-06

## 2018-01-24 MED ORDER — BUPIVACAINE HCL (PF) 0.25 % IJ SOLN: INTRAMUSCULAR | Status: DC | PRN

## 2018-01-24 MED ORDER — MIDAZOLAM HCL 5 MG/5ML IJ SOLN
INTRAMUSCULAR | Status: DC | PRN
  Administered 2018-01-24: 2 mg via INTRAVENOUS

## 2018-01-24 MED ORDER — LIDOCAINE HCL (CARDIAC) 20 MG/ML IV SOLN
INTRAVENOUS | Status: DC | PRN
  Administered 2018-01-24: 100 mg via INTRAVENOUS

## 2018-01-24 MED ORDER — SUGAMMADEX SODIUM 200 MG/2ML IV SOLN
INTRAVENOUS | Status: DC | PRN
  Administered 2018-01-24: 200 mg via INTRAVENOUS

## 2018-01-24 MED ORDER — ROCURONIUM BROMIDE 10 MG/ML (PF) SYRINGE
PREFILLED_SYRINGE | INTRAVENOUS | Status: AC
  Filled 2018-01-24: qty 5

## 2018-01-24 MED ORDER — LACTATED RINGERS IV SOLN
INTRAVENOUS | Status: DC
  Administered 2018-01-24: 09:00:00 via INTRAVENOUS

## 2018-01-24 MED ORDER — MIDAZOLAM HCL 2 MG/2ML IJ SOLN
INTRAMUSCULAR | Status: AC
  Filled 2018-01-24: qty 2

## 2018-01-24 MED ORDER — ROCURONIUM BROMIDE 100 MG/10ML IV SOLN
INTRAVENOUS | Status: DC | PRN
  Administered 2018-01-24: 50 mg via INTRAVENOUS

## 2018-01-24 MED ORDER — LIDOCAINE 2% (20 MG/ML) 5 ML SYRINGE
INTRAMUSCULAR | Status: AC
  Filled 2018-01-24: qty 5

## 2018-01-24 MED ORDER — ONDANSETRON HCL 4 MG/2ML IJ SOLN
INTRAMUSCULAR | Status: AC
  Filled 2018-01-24: qty 2

## 2018-01-24 MED ORDER — ONDANSETRON HCL 4 MG/2ML IJ SOLN
INTRAMUSCULAR | Status: DC | PRN
  Administered 2018-01-24: 4 mg via INTRAVENOUS

## 2018-01-24 SURGICAL SUPPLY — 58 items
BIOPATCH RED 1 DISK 7.0 (GAUZE/BANDAGES/DRESSINGS) ×2 IMPLANT
BIOPATCH RED 1IN DISK 7.0MM (GAUZE/BANDAGES/DRESSINGS) ×1
BLADE CLIPPER SURG (BLADE) IMPLANT
BLADE SURG 10 STRL SS (BLADE) ×3 IMPLANT
BLADE SURG 15 STRL LF DISP TIS (BLADE) ×1 IMPLANT
BLADE SURG 15 STRL SS (BLADE) ×2
CHLORAPREP W/TINT 26ML (MISCELLANEOUS) ×3 IMPLANT
COVER SURGICAL LIGHT HANDLE (MISCELLANEOUS) ×3 IMPLANT
DECANTER SPIKE VIAL GLASS SM (MISCELLANEOUS) ×3 IMPLANT
DERMABOND ADVANCED (GAUZE/BANDAGES/DRESSINGS) ×4
DERMABOND ADVANCED .7 DNX12 (GAUZE/BANDAGES/DRESSINGS) ×2 IMPLANT
DRAIN CHANNEL 19F RND (DRAIN) ×3 IMPLANT
DRAPE LAPAROSCOPIC ABDOMINAL (DRAPES) ×3 IMPLANT
DRAPE LAPAROTOMY 100X72 PEDS (DRAPES) IMPLANT
DRAPE UTILITY XL STRL (DRAPES) ×3 IMPLANT
DRSG TEGADERM 4X4.75 (GAUZE/BANDAGES/DRESSINGS) ×3 IMPLANT
ELECT CAUTERY BLADE 6.4 (BLADE) ×3 IMPLANT
ELECT REM PT RETURN 9FT ADLT (ELECTROSURGICAL) ×3
ELECTRODE REM PT RTRN 9FT ADLT (ELECTROSURGICAL) ×1 IMPLANT
EVACUATOR SILICONE 100CC (DRAIN) ×3 IMPLANT
GLOVE BIO SURGEON STRL SZ8 (GLOVE) ×3 IMPLANT
GLOVE BIOGEL PI IND STRL 7.0 (GLOVE) ×1 IMPLANT
GLOVE BIOGEL PI IND STRL 7.5 (GLOVE) ×1 IMPLANT
GLOVE BIOGEL PI IND STRL 8 (GLOVE) ×1 IMPLANT
GLOVE BIOGEL PI IND STRL 8.5 (GLOVE) ×1 IMPLANT
GLOVE BIOGEL PI INDICATOR 7.0 (GLOVE) ×2
GLOVE BIOGEL PI INDICATOR 7.5 (GLOVE) ×2
GLOVE BIOGEL PI INDICATOR 8 (GLOVE) ×2
GLOVE BIOGEL PI INDICATOR 8.5 (GLOVE) ×2
GLOVE ECLIPSE 8.0 STRL XLNG CF (GLOVE) ×3 IMPLANT
GLOVE SURG SS PI 7.0 STRL IVOR (GLOVE) ×3 IMPLANT
GOWN STRL REUS W/ TWL LRG LVL3 (GOWN DISPOSABLE) IMPLANT
GOWN STRL REUS W/ TWL XL LVL3 (GOWN DISPOSABLE) ×3 IMPLANT
GOWN STRL REUS W/TWL LRG LVL3 (GOWN DISPOSABLE)
GOWN STRL REUS W/TWL XL LVL3 (GOWN DISPOSABLE) ×6
KIT BASIN OR (CUSTOM PROCEDURE TRAY) ×3 IMPLANT
KIT ROOM TURNOVER OR (KITS) ×3 IMPLANT
NEEDLE HYPO 25GX1X1/2 BEV (NEEDLE) ×3 IMPLANT
NS IRRIG 1000ML POUR BTL (IV SOLUTION) ×3 IMPLANT
PACK SURGICAL SETUP 50X90 (CUSTOM PROCEDURE TRAY) ×3 IMPLANT
PAD ARMBOARD 7.5X6 YLW CONV (MISCELLANEOUS) ×3 IMPLANT
PENCIL BUTTON HOLSTER BLD 10FT (ELECTRODE) ×3 IMPLANT
SPECIMEN JAR LARGE (MISCELLANEOUS) ×3 IMPLANT
SPECIMEN JAR MEDIUM (MISCELLANEOUS) IMPLANT
SPONGE LAP 18X18 X RAY DECT (DISPOSABLE) ×3 IMPLANT
SUT ETHILON 2 0 FS 18 (SUTURE) ×3 IMPLANT
SUT MNCRL AB 4-0 PS2 18 (SUTURE) ×3 IMPLANT
SUT VIC AB 0 CT1 18XCR BRD 8 (SUTURE) ×1 IMPLANT
SUT VIC AB 0 CT1 27 (SUTURE) ×2
SUT VIC AB 0 CT1 27XBRD ANBCTR (SUTURE) ×1 IMPLANT
SUT VIC AB 0 CT1 8-18 (SUTURE) ×2
SUT VIC AB 2-0 SH 27 (SUTURE)
SUT VIC AB 2-0 SH 27X BRD (SUTURE) IMPLANT
SUT VIC AB 3-0 SH 27 (SUTURE) ×2
SUT VIC AB 3-0 SH 27XBRD (SUTURE) ×1 IMPLANT
SYR CONTROL 10ML LL (SYRINGE) ×3 IMPLANT
TOWEL OR 17X24 6PK STRL BLUE (TOWEL DISPOSABLE) ×3 IMPLANT
TOWEL OR 17X26 10 PK STRL BLUE (TOWEL DISPOSABLE) IMPLANT

## 2018-01-24 NOTE — Transfer of Care (Signed)
Immediate Anesthesia Transfer of Care Note  Patient: Tracy Bennett  Procedure(s) Performed: EXCISION OF BACK MASS (N/A Back)  Patient Location: PACU  Anesthesia Type:General  Level of Consciousness: awake, alert  and oriented  Airway & Oxygen Therapy: Patient Spontanous Breathing and Patient connected to face mask oxygen  Post-op Assessment: Report given to RN, Post -op Vital signs reviewed and stable and Patient moving all extremities X 4  Post vital signs: Reviewed and stable  Last Vitals:  Vitals:   01/24/18 0800 01/24/18 0801  BP:  (!) 154/74  Pulse: 86   Resp: 18   Temp: 36.8 C   SpO2: 96%     Last Pain:  Vitals:   01/24/18 0852  TempSrc:   PainSc: 2          Complications: No apparent anesthesia complications

## 2018-01-24 NOTE — H&P (Signed)
Tracy Bennett Documented: 11/28/2017 1:53 PM Location: [facility] Patient #: [id number] DOB: 06-07-1946 Married / Language: Tracy Bennett / Race: White Male  History of Present Illness Tracy Moores A. Asa Baudoin MD Patient words: Patient sent at the request of Tracy Bennett for mass over upper central back. His is grown slowly over the last 7 day years. It does interfere with his range of motion. He has had previous multiple spinal fusions. The mass is larger and causing mild discomfort. It is mobile. There is no redness or drainage.  The patient is a 72 year old male.   Past Surgical History (Tracy Bennett, RMA; Cataract Surgery Bilateral. Foot Surgery Right. Knee Surgery Left. Spinal Surgery - Neck  Diagnostic Studies History (Tracy Bennett, [place]; Colonoscopy 1-5 years ago  Allergies (Tracy Bennett, [place]; 11/28/2017 1:57 PM) Amoxicillin *PENICILLINS* Dyes contrast media (iodinated diagnostic agents) Lisinopril *CHEMICALS*  Medication History (Tracy Bennett, RMA;  Azithromycin (250MG  Tablet, Oral) Active. Furosemide (20MG  Tablet, Oral) Active. Ventolin HFA (108 (90 Base)MCG/ACT Aerosol Soln, Inhalation) Active. Valsartan-Hydrochlorothiazide (80-12.5MG  Tablet, Oral) Active. Toviaz (8MG  Tablet ER 24HR, Oral) Active. Simvastatin (40MG  Tablet, Oral) Active. Pantoprazole Sodium (40MG  Tablet DR, Oral) Active. Finasteride (5MG  Tablet, Oral) Active. PredniSONE (10MG  Tablet, Oral) Active. Tamsulosin HCl (0.4MG  Capsule, Oral) Active. Medications Reconciled  Social History (Tracy Bennett, [place];  Alcohol use Moderate alcohol use. Caffeine use Carbonated beverages, Coffee, Tea. No drug use Tobacco use Former smoker.  Family History (Tracy Bennett, RMA;  Hypertension Mother. Respiratory Condition Mother.  Other Problems (Tracy Bennett, [place]; Back Pain Hypercholesterolemia Kidney Stone Sleep Apnea Umbilical Hernia  Repair     Review of Systems (Tracy Bennett RMA; General Not Present- Appetite Loss, Chills, Fatigue, Fever, Night Sweats, Weight Gain and Weight Loss. Skin Not Present- Change in Wart/Mole, Dryness, Hives, Jaundice, New Lesions, Non-Healing Wounds, Rash and Ulcer. HEENT Not Present- Earache, Hearing Loss, Hoarseness, Nose Bleed, Oral Ulcers, Ringing in the Ears, Seasonal Allergies, Sinus Pain, Sore Throat, Visual Disturbances, Wears glasses/contact lenses and Yellow Eyes. Respiratory Not Present- Bloody sputum, Chronic Cough, Difficulty Breathing, Snoring and Wheezing. Breast Not Present- Breast Mass, Breast Pain, Nipple Discharge and Skin Changes. Cardiovascular Not Present- Chest Pain, Difficulty Breathing Lying Down, Leg Cramps, Palpitations, Rapid Heart Rate, Shortness of Breath and Swelling of Extremities. Gastrointestinal Not Present- Abdominal Pain, Bloating, Bloody Stool, Change in Bowel Habits, Chronic diarrhea, Constipation, Difficulty Swallowing, Excessive gas, Gets full quickly at meals, Hemorrhoids, Indigestion, Nausea, Rectal Pain and Vomiting. Male Genitourinary Not Present- Blood in Urine, Change in Urinary Stream, Frequency, Impotence, Nocturia, Painful Urination, Urgency and Urine Leakage. Musculoskeletal Present- Swelling of Extremities. Not Present- Back Pain, Joint Pain, Joint Stiffness, Muscle Pain and Muscle Weakness. Neurological Not Present- Decreased Memory, Fainting, Headaches, Numbness, Seizures, Tingling, Tremor, Trouble walking and Weakness. Psychiatric Not Present- Anxiety, Bipolar, Change in Sleep Pattern, Depression, Fearful and Frequent crying. Endocrine Not Present- Cold Intolerance, Excessive Hunger, Hair Changes, Heat Intolerance, Hot flashes and New Diabetes. Hematology Not Present- Blood Thinners, Easy Bruising, Excessive bleeding, Gland problems, HIV and Persistent Infections.  Vitals (Tracy Bennett RMA;  11/28/2017 1:54 PM Weight: 324.2 lb  Height: 70in Body Surface Area: 2.56 m Body Mass Index: 46.52 kg/m  Temp.: 98.5F  Pulse: 77 (Regular)  BP: 126/82 (Sitting, Left Arm, Standard)      Physical Exam (Tracy Gene A. Dung Salinger MD;   General Mental Status-Alert. General Appearance-Consistent with stated age. Hydration-Well hydrated. Voice-Normal.  Integumentary Note: 20 cm x 20 cm fatty mass upper central  back at the base of his neck. It is mobile. It is not fixed to underlying structures.  Chest and Lung Exam Chest and lung exam reveals -quiet, even and easy respiratory effort with no use of accessory muscles and on auscultation, normal breath sounds, no adventitious sounds and normal vocal resonance. Inspection Chest Wall - Normal. Back - normal.  Cardiovascular Cardiovascular examination reveals -normal heart sounds, regular rate and rhythm with no murmurs and normal pedal pulses bilaterally.  Neurologic Neurologic evaluation reveals -alert and oriented x 3 with no impairment of recent or remote memory. Mental Status-Normal.  Musculoskeletal Normal Exam - Left-Upper Extremity Strength Normal and Lower Extremity Strength Normal. Normal Exam - Right-Upper Extremity Strength Normal and Lower Extremity Strength Normal.    Assessment & Plan   MASS OF SUBCUTANEOUS TISSUE OF BACK (R22.2) Impression: Discussed options of observation versus excision. It is causing symptoms and is on call for the patient to have. It is mobile and not fixed and he neurologically is intact therefore do not recommend imaging studies since his physical large subcutaneous fatty mass possibly lipoma. Risks and benefits of surgery discussed. Observations discussed. I do not feel core biopsy will be helpful in this circumstance.  Current Plans The pathophysiology of skin & subcutaneous masses was discussed. Natural history risks without surgery were discussed. I recommended surgery to remove the  mass. I explained the technique of removal with use of local anesthesia & possible need for more aggressive sedation/anesthesia for patient comfort.  Risks such as bleeding, infection, wound breakdown, heart attack, death, and other risks were discussed. I noted a good likelihood this will help address the problem. Possibility that this will not correct all symptoms was explained. Possibility of regrowth/recurrence of the mass was discussed. We will work to minimize complications. Questions were answered. The patient expresses understanding & wishes to proceed with surgery.  Pt Education - [facility] Free Text Education/Instructions: discussed with patient and provided information.

## 2018-01-24 NOTE — Discharge Instructions (Signed)
Surgical Drain Home Care °Surgical drains are used to remove extra fluid that normally builds up in a surgical wound after surgery. A surgical drain helps to heal a surgical wound. Different kinds of surgical drains include: °· Active drains. These drains use suction to pull drainage away from the surgical wound. Drainage flows through a tube to a container outside of the body. It is important to keep the bulb or the drainage container flat (compressed) at all times, except while you empty it. Flattening the bulb or container creates suction. The two most common types of active drains are bulb drains and Hemovac drains. °· Passive drains. These drains allow fluid to drain naturally, by gravity. Drainage flows through a tube to a bandage (dressing) or a container outside of the body. Passive drains do not need to be emptied. The most common type of passive drain is the Penrose drain. ° °A drain is placed during surgery. Immediately after surgery, drainage is usually bright red and a little thicker than water. The drainage may gradually turn yellow or pink and become thinner. It is likely that your health care provider will remove the drain when the drainage stops or when the amount decreases to 1-2 Tbsp (15-30 mL) during a 24-hour period. °How to care for your surgical drain °· Keep the skin around the drain dry and covered with a dressing at all times. °· Check your drain area every day for signs of infection. Check for: °? More redness, swelling, or pain. °? Pus or a bad smell. °? Cloudy drainage. °Follow instructions from your health care provider about how to take care of your drain and how to change your dressing. Change your dressing at least one time every day. Change it more often if needed to keep the dressing dry. Make sure you: °1. Gather your supplies, including: °? Tape. °? Germ-free cleaning solution (sterile saline). °? Split gauze drain sponge: 4 x 4 inches (10 x 10 cm). °? Gauze square: 4 x 4 inches  (10 x 10 cm). °2. Wash your hands with soap and water before you change your dressing. If soap and water are not available, use hand sanitizer. °3. Remove the old dressing. Avoid using scissors to do that. °4. Use sterile saline to clean your skin around the drain. °5. Place the tube through the slit in a drain sponge. Place the drain sponge so that it covers your wound. °6. Place the gauze square or another drain sponge on top of the drain sponge that is on the wound. Make sure the tube is between those layers. °7. Tape the dressing to your skin. °8. If you have an active bulb or Hemovac drain, tape the drainage tube to your skin 1-2 inches (2.5-5 cm) below the place where the tube enters your body. Taping keeps the tube from pulling on any stitches (sutures) that you have. °9. Wash your hands with soap and water. °10. Write down the color of your drainage and how often you change your dressing. ° °How to empty your active bulb or Hemovac drain °1. Make sure that you have a measuring cup that you can empty your drainage into. °2. Wash your hands with soap and water. If soap and water are not available, use hand sanitizer. °3. Gently move your fingers down the tube while squeezing very lightly. This is called stripping the tube. This clears any drainage, clots, or tissue from the tube. °? Do not pull on the tube. °? You may need to strip   the tube several times every day to keep the tube clear. 4. Open the bulb cap or the drain plug. Do not touch the inside of the cap or the bottom of the plug. 5. Empty all of the drainage into the measuring cup. 6. Compress the bulb or the container and replace the cap or the plug. To compress the bulb or the container, squeeze it firmly in the middle while you close the cap or plug the container. 7. Write down the amount of drainage that you have in each 24-hour period. If you have less than 2 Tbsp (30 mL) of drainage during 24 hours, contact your health care  provider. 8. Flush the drainage down the toilet. 9. Wash your hands with soap and water. Contact a health care provider if:  You have more redness, swelling, or pain around your drain area.  The amount of drainage that you have is increasing instead of decreasing.  You have pus or a bad smell coming from your drain area.  You have a fever.  You have drainage that is cloudy.  There is a sudden stop or a sudden decrease in the amount of drainage that you have.  Your tube falls out.  Your active draindoes not stay compressedafter you empty it. This information is not intended to replace advice given to you by your health care provider. Make sure you discuss any questions you have with your health care provider. Document Released: 11/10/2000 Document Revised: 04/20/2016 Document Reviewed: 06/02/2015 Elsevier Interactive Patient Education  2018   GENERAL SURGERY: POST OP INSTRUCTIONS  ######################################################################  EAT Gradually transition to a high fiber diet with a fiber supplement over the next few weeks after discharge.  Start with a pureed / full liquid diet (see below)  WALK Walk an hour a day.  Control your pain to do that.    CONTROL PAIN Control pain so that you can walk, sleep, tolerate sneezing/coughing, go up/down stairs.  HAVE A BOWEL MOVEMENT DAILY Keep your bowels regular to avoid problems.  OK to try a laxative to override constipation.  OK to use an antidairrheal to slow down diarrhea.  Call if not better after 2 tries  CALL IF YOU HAVE PROBLEMS/CONCERNS Call if you are still struggling despite following these instructions. Call if you have concerns not answered by these instructions  ######################################################################    1. DIET: Follow a light bland diet the first 24 hours after arrival home, such as soup, liquids, crackers, etc.  Be sure to include lots of fluids daily.   Avoid fast food or heavy meals as your are more likely to get nauseated.   2. Take your usually prescribed home medications unless otherwise directed. 3. PAIN CONTROL: a. Pain is best controlled by a usual combination of three different methods TOGETHER: i. Ice/Heat ii. Over the counter pain medication iii. Prescription pain medication b. Most patients will experience some swelling and bruising around the incisions.  Ice packs or heating pads (30-60 minutes up to 6 times a day) will help. Use ice for the first few days to help decrease swelling and bruising, then switch to heat to help relax tight/sore spots and speed recovery.  Some people prefer to use ice alone, heat alone, alternating between ice & heat.  Experiment to what works for you.  Swelling and bruising can take several weeks to resolve.   c. It is helpful to take an over-the-counter pain medication regularly for the first few weeks.  Choose one of the following  that works best for you: i. Naproxen (Aleve, etc)  Two 220mg  tabs twice a day ii. Ibuprofen (Advil, etc) Three 200mg  tabs four times a day (every meal & bedtime) iii. Acetaminophen (Tylenol, etc) 500-650mg  four times a day (every meal & bedtime) d. A  prescription for pain medication (such as oxycodone, hydrocodone, etc) should be given to you upon discharge.  Take your pain medication as prescribed.  i. If you are having problems/concerns with the prescription medicine (does not control pain, nausea, vomiting, rash, itching, etc), please call us [phone] to see if we need to switch you to a different pain medicine that will work better for you and/or control your side effect better. ii. If you need a refill on your pain medication, please contact your pharmacy.  They will contact our office to request authorization. Prescriptions will not be filled after 5 pm or on week-ends. 4. Avoid getting constipated.  Between the surgery and the pain medications, it is common to  experience some constipation.  Increasing fluid intake and taking a fiber supplement (such as Metamucil, Citrucel, FiberCon, MiraLax, etc) 1-2 times a day regularly will usually help prevent this problem from occurring.  A mild laxative (prune juice, Milk of Magnesia, MiraLax, etc) should be taken according to package directions if there are no bowel movements after 48 hours.   5. Wash / shower every day.  You may shower over the dressings as they are waterproof.  Continue to shower over incision(s) after the dressing is off. 6. Remove your waterproof bandages 5 days after surgery.  You may leave the incision open to air.  You may have skin tapes (Steri Strips) covering the incision(s).  Leave them on until one week, then remove.  You may replace a dressing/Band-Aid to cover the incision for comfort if you wish.      7. ACTIVITIES as tolerated:   a. You may resume regular (light) daily activities beginning the next day--such as daily self-care, walking, climbing stairs--gradually increasing activities as tolerated.  If you can walk 30 minutes without difficulty, it is safe to try more intense activity such as jogging, treadmill, bicycling, low-impact aerobics, swimming, etc. b. Save the most intensive and strenuous activity for last such as sit-ups, heavy lifting, contact sports, etc  Refrain from any heavy lifting or straining until you are off narcotics for pain control.   c. DO NOT PUSH THROUGH PAIN.  Let pain be your guide: If it hurts to do something, don't do it.  Pain is your body warning you to avoid that activity for another week until the pain goes down. d. You may drive when you are no longer taking prescription pain medication, you can comfortably wear a seatbelt, and you can safely maneuver your car and apply brakes. e. Dennis Bast may have sexual intercourse when it is comfortable.  8. FOLLOW UP in our office a. Please call [facility] at [phone] to set up an appointment to see your surgeon in the  office for a follow-up appointment approximately 2-3 weeks after your surgery. b. Make sure that you call for this appointment the day you arrive home to insure a convenient appointment time. 9. IF YOU HAVE DISABILITY OR FAMILY LEAVE FORMS, BRING THEM TO THE OFFICE FOR PROCESSING.  DO NOT GIVE THEM TO YOUR DOCTOR.   WHEN TO CALL us [phone]: 1. Poor pain control 2. Reactions / problems with new medications (rash/itching, nausea, etc)  3. Fever over 101.5 F (38.5 C) 4. Worsening  swelling or bruising 5. Continued bleeding from incision. 6. Increased pain, redness, or drainage from the incision 7. Difficulty breathing / swallowing   The clinic staff is available to answer your questions during regular business hours (8:30am-5pm).  Please dont hesitate to call and ask to speak to one of our nurses for clinical concerns.   If you have a medical emergency, go to the nearest emergency room or call 911.  A surgeon from [facility] is always on call at the [facility], [place], [place], [place], [place]  [id number] ? MAIN: [phone] ? TOLL FREE: [phone] ?  FAX [phone] [email or web address]

## 2018-01-24 NOTE — Op Note (Signed)
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  This been present for many years and growing and causing discomfort.  Risk, benefits and nonoperative management discussed with the patient.  Pros and cons of each discussed with the patient.The procedure has been discussed with the patient.  Alternative therapies have been discussed with the patient.  Operative risks include bleeding,  Infection,  Organ injury,  Nerve injury,  Blood vessel injury,  DVT,  Pulmonary embolism,  Death,  And possible reoperation.  Medical management risks include worsening of present situation.  The success of the procedure is 50 -90 % at treating patients symptoms.  The patient understands and agrees to proceed.      Description of procedure: The patient was met in the holding area.  The area was marked.  Questions were answered.  He was then taken back to the operating room.  He was placed supine where general anesthesia was initiated.  He was  then placed on a beanbag and rolled with his left side down.  The mass was located in the central upper back just below his neck.  This was prepped and draped in a sterile fashion.  Timeout was done.  He received preoperative antibiotics.  Local anesthetic was infiltrated around the mass.  A transverse incision was made.  Dissection was carried down and a multilobulated lipoma was encountered.  It was excised with gross negative margins.  This extended all the way down to the trapezius muscle but did not involve the trapezius muscle.  Hemostasis was  achieved with cautery.  Through a separate stab incision in 19 round drain was placed.  We reinspected the wound and found to be hemostatic.  The drain was placed to bulb suction.  2-0 nylon suture placed all the drain in place.  Wound is closed with 0 Vicryl.  4-0 Monocryl was used to close the skin in a subcuticular fashion.  Dermabond applied.  All final counts were found to be correct sponge, needles and instruments.  The drain was placed to seal.  He was then placed back supine extubated taken to recovery in satisfactory condition. 

## 2018-01-24 NOTE — Interval H&P Note (Signed)
History and Physical Interval Note:  01/24/2018 9:02 AM  Tracy Bennett  has presented today for surgery, with the diagnosis of 20 CM X 20CM BACK MASS  The various methods of treatment have been discussed with the patient and family. After consideration of risks, benefits and other options for treatment, the patient has consented to  Procedure(s): EXCISION OF BACK MASS (N/A) as a surgical intervention .  The patient's history has been reviewed, patient examined, no change in status, stable for surgery.  I have reviewed the patient's chart and labs.  Questions were answered to the patient's satisfaction.     [place]

## 2018-01-24 NOTE — Anesthesia Procedure Notes (Signed)
Procedure Name: Intubation Date/Time: 01/24/2018 9:44 AM Performed by: Kyung Rudd, CRNA Pre-anesthesia Checklist: Patient identified, Emergency Drugs available, Suction available and Patient being monitored Patient Re-evaluated:Patient Re-evaluated prior to induction Oxygen Delivery Method: Circle system utilized Preoxygenation: Pre-oxygenation with 100% oxygen Induction Type: IV induction Ventilation: Mask ventilation without difficulty and Oral airway inserted - appropriate to patient size Laryngoscope Size: Glidescope and 4 Tube type: Oral Tube size: 7.5 mm Number of attempts: 1 Airway Equipment and Method: Stylet and Video-laryngoscopy Placement Confirmation: ETT inserted through vocal cords under direct vision,  positive ETCO2 and breath sounds checked- equal and bilateral Secured at: 22 cm Tube secured with: Tape Dental Injury: Teeth and Oropharynx as per pre-operative assessment

## 2018-01-24 NOTE — Anesthesia Preprocedure Evaluation (Addendum)
Anesthesia Evaluation  Patient identified by MRN, date of birth, ID band Patient awake    Reviewed: Allergy & Precautions, NPO status , Patient's Chart, lab work & pertinent test results  Airway Mallampati: III  TM Distance: >3 FB Neck ROM: Full    Dental no notable dental hx. (+) Teeth Intact, Dental Advisory Given   Pulmonary sleep apnea , former smoker,    Pulmonary exam normal        Cardiovascular hypertension, Normal cardiovascular exam     Neuro/Psych negative neurological ROS  negative psych ROS   GI/Hepatic   Endo/Other  negative endocrine ROS  Renal/GU   negative genitourinary   Musculoskeletal   Abdominal   Peds  Hematology   Anesthesia Other Findings   Reproductive/Obstetrics negative OB ROS                           Lab Results  Component Value Date   CREATININE 1.24 01/21/2018   BUN 10 01/21/2018   NA 136 01/21/2018   K 3.8 01/21/2018   CL 102 01/21/2018   CO2 24 01/21/2018   Lab Results  Component Value Date   WBC 9.2 01/21/2018   HGB 15.5 01/21/2018   HCT 46.6 01/21/2018   MCV 88.8 01/21/2018   PLT 161 01/21/2018     Anesthesia Physical Anesthesia Plan  ASA: IV  Anesthesia Plan: General   Post-op Pain Management:    Induction: Intravenous  PONV Risk Score and Plan: Treatment may vary due to age or medical condition  Airway Management Planned: Oral ETT and Video Laryngoscope Planned  Additional Equipment:   Intra-op Plan:   Post-operative Plan: Extubation in OR  Informed Consent: I have reviewed the patients History and Physical, chart, labs and discussed the procedure including the risks, benefits and alternatives for the proposed anesthesia with the patient or authorized representative who has indicated his/her understanding and acceptance.   Dental advisory given  Plan Discussed with: CRNA  Anesthesia Plan Comments:        Anesthesia  Quick Evaluation

## 2018-01-24 NOTE — Anesthesia Postprocedure Evaluation (Signed)
Anesthesia Post Note  Patient: Tracy Bennett  Procedure(s) Performed: EXCISION OF BACK MASS (N/A Back)     Patient location during evaluation: PACU Anesthesia Type: General Level of consciousness: awake and alert Pain management: pain level controlled Vital Signs Assessment: post-procedure vital signs reviewed and stable Respiratory status: spontaneous breathing, nonlabored ventilation, respiratory function stable and patient connected to nasal cannula oxygen Cardiovascular status: blood pressure returned to baseline and stable Postop Assessment: no apparent nausea or vomiting Anesthetic complications: no    Last Vitals:  Vitals:   01/24/18 1202 01/24/18 1215  BP: 137/62 127/64  Pulse: 90 88  Resp: 19 (!) 22  Temp:    SpO2: 93% 93%    Last Pain:  Vitals:   01/24/18 1200  TempSrc:   PainSc: 0-No pain                 [place]

## 2018-01-25 ENCOUNTER — Encounter (HOSPITAL_COMMUNITY): Payer: Self-pay | Admitting: Surgery

## 2018-02-04 ENCOUNTER — Encounter: Payer: Self-pay | Admitting: Family Medicine

## 2018-02-04 ENCOUNTER — Ambulatory Visit (INDEPENDENT_AMBULATORY_CARE_PROVIDER_SITE_OTHER): Payer: Medicare Other | Admitting: Family Medicine

## 2018-02-04 VITALS — BP 119/68 | HR 79 | Ht 70.0 in | Wt 331.0 lb

## 2018-02-04 DIAGNOSIS — J40 Bronchitis, not specified as acute or chronic: Secondary | ICD-10-CM | POA: Diagnosis not present

## 2018-02-04 DIAGNOSIS — J329 Chronic sinusitis, unspecified: Secondary | ICD-10-CM

## 2018-02-04 MED ORDER — DOXYCYCLINE HYCLATE 100 MG PO TABS: 100.0000 mg | ORAL_TABLET | Freq: Two times a day (BID) | ORAL | 0 refills | Status: DC

## 2018-02-04 NOTE — Progress Notes (Signed)
   Subjective:    Patient ID: Tracy Bennett, male    DOB: 1946-10-17, 72 y.o.   MRN: [id number]  HPI 72 year old male with OSA comes in today complaining of cough with yellow mucus for about 2 weeks.  He says it started right before his surgery when he went in to do his preop.  He never ran a fever.  Is worse at night.  He is been mostly taking Mucinex Claritin and Robitussin.  Is afebrile.  Initially his cough got better right after surgery but he was also on pain medication and now that he is off of that it seems like the cough is picked back up and is gotten worse.  Is keeping him and his wife awake at night.  He even took some old cough syrup that he had.  He denies any shortness of breath or wheezing feels like he is getting some mucus from his upper chest.  And may be a little bit of drainage from his sinuses particularly at night.  No facial pain or headaches.  He did have his surgery to remove the large lipoma on his upper back.  He is doing well.  He went in for his follow-up appointment today and hopes that they would remove the drain unfortunately he is still producing about 70 cc daily so they are hoping they might be able to remove it on Friday if the drainage slows down.  But he actually has done really well and he is off of his pain medications.  Review of Systems     Objective:   Physical Exam  Constitutional: He is oriented to person, place, and time. He appears well-developed and well-nourished.  HENT:  Head: Normocephalic and atraumatic.  Right Ear: External ear normal.  Left Ear: External ear normal.  Nose: Nose normal.  Mouth/Throat: Oropharynx is clear and moist.  TMs and canals are clear.   Eyes: Conjunctivae and EOM are normal. Pupils are equal, round, and reactive to light.  Neck: Neck supple. No thyromegaly present.  Cardiovascular: Normal rate and normal heart sounds.  Pulmonary/Chest: Effort normal and breath sounds normal.  Lymphadenopathy:    He has no  cervical adenopathy.  Neurological: He is alert and oriented to person, place, and time.  Skin: Skin is warm and dry.  Psychiatric: He has a normal mood and affect.       Assessment & Plan:  Sinobronchitis -this point he has had symptoms persistent for 2 weeks and feels like he is not improving.  We will go ahead and treat with doxycycline.  Call if not significantly better by the end of the week or if suddenly worse.

## 2018-02-05 ENCOUNTER — Ambulatory Visit: Payer: Medicare Other | Admitting: Physician Assistant

## 2018-02-06 ENCOUNTER — Telehealth: Payer: Self-pay

## 2018-02-06 MED ORDER — HYDROCODONE-HOMATROPINE 5-1.5 MG/5ML PO SYRP: 5.0000 mL | ORAL_SOLUTION | Freq: Every evening | ORAL | 0 refills | Status: DC | PRN

## 2018-02-06 NOTE — Telephone Encounter (Signed)
Patient's wife advised

## 2018-02-06 NOTE — Telephone Encounter (Signed)
Tracy Bennett's wife called and states he was unable to sleep due to the cough. She would like something sent in. Please advise.

## 2018-02-06 NOTE — Telephone Encounter (Signed)
rx sent e-scribe

## 2018-02-12 ENCOUNTER — Other Ambulatory Visit: Payer: Self-pay | Admitting: Family Medicine

## 2018-02-13 ENCOUNTER — Other Ambulatory Visit: Payer: Self-pay | Admitting: Family Medicine

## 2018-02-25 ENCOUNTER — Telehealth: Payer: Self-pay | Admitting: Family Medicine

## 2018-02-25 MED ORDER — AZITHROMYCIN 250 MG PO TABS: ORAL_TABLET | ORAL | 0 refills | Status: AC

## 2018-02-25 NOTE — Telephone Encounter (Signed)
Pt's wife advised.

## 2018-02-25 NOTE — Telephone Encounter (Signed)
Pt called and stated he seen you on 02/04/18 for sinobronchitis and stated he is slightly feeling better but still has some congestion and a bad cough. He wanted to know if he needs to come in again or if he can get some medication called in to see if it can help get rid of what is still lingering on. Thanks

## 2018-02-25 NOTE — Telephone Encounter (Signed)
Okay, new prescription sent over for Z-Pak.  This will cover atypicals.  If not better by the end of the week then will need to schedule an appointment to be seen.

## 2018-04-16 ENCOUNTER — Other Ambulatory Visit: Payer: Self-pay | Admitting: Family Medicine

## 2018-04-17 DIAGNOSIS — M6240 Contracture of muscle, unspecified site: Secondary | ICD-10-CM | POA: Diagnosis not present

## 2018-04-17 DIAGNOSIS — M9904 Segmental and somatic dysfunction of sacral region: Secondary | ICD-10-CM | POA: Diagnosis not present

## 2018-04-17 DIAGNOSIS — M5441 Lumbago with sciatica, right side: Secondary | ICD-10-CM | POA: Diagnosis not present

## 2018-04-17 DIAGNOSIS — M9903 Segmental and somatic dysfunction of lumbar region: Secondary | ICD-10-CM | POA: Diagnosis not present

## 2018-04-17 DIAGNOSIS — M9905 Segmental and somatic dysfunction of pelvic region: Secondary | ICD-10-CM | POA: Diagnosis not present

## 2018-04-24 DIAGNOSIS — M9905 Segmental and somatic dysfunction of pelvic region: Secondary | ICD-10-CM | POA: Diagnosis not present

## 2018-04-24 DIAGNOSIS — M9903 Segmental and somatic dysfunction of lumbar region: Secondary | ICD-10-CM | POA: Diagnosis not present

## 2018-04-24 DIAGNOSIS — M6240 Contracture of muscle, unspecified site: Secondary | ICD-10-CM | POA: Diagnosis not present

## 2018-04-24 DIAGNOSIS — M9904 Segmental and somatic dysfunction of sacral region: Secondary | ICD-10-CM | POA: Diagnosis not present

## 2018-04-24 DIAGNOSIS — M5441 Lumbago with sciatica, right side: Secondary | ICD-10-CM | POA: Diagnosis not present

## 2018-05-07 DIAGNOSIS — Z86008 Personal history of in-situ neoplasm of other site: Secondary | ICD-10-CM | POA: Diagnosis not present

## 2018-05-07 DIAGNOSIS — L570 Actinic keratosis: Secondary | ICD-10-CM | POA: Diagnosis not present

## 2018-05-07 DIAGNOSIS — L218 Other seborrheic dermatitis: Secondary | ICD-10-CM | POA: Diagnosis not present

## 2018-05-08 DIAGNOSIS — M5441 Lumbago with sciatica, right side: Secondary | ICD-10-CM | POA: Diagnosis not present

## 2018-05-08 DIAGNOSIS — M9905 Segmental and somatic dysfunction of pelvic region: Secondary | ICD-10-CM | POA: Diagnosis not present

## 2018-05-08 DIAGNOSIS — M6240 Contracture of muscle, unspecified site: Secondary | ICD-10-CM | POA: Diagnosis not present

## 2018-05-08 DIAGNOSIS — M9903 Segmental and somatic dysfunction of lumbar region: Secondary | ICD-10-CM | POA: Diagnosis not present

## 2018-05-08 DIAGNOSIS — M9904 Segmental and somatic dysfunction of sacral region: Secondary | ICD-10-CM | POA: Diagnosis not present

## 2018-05-09 ENCOUNTER — Ambulatory Visit (INDEPENDENT_AMBULATORY_CARE_PROVIDER_SITE_OTHER): Payer: Medicare Other | Admitting: Physician Assistant

## 2018-05-09 ENCOUNTER — Encounter: Payer: Self-pay | Admitting: Physician Assistant

## 2018-05-09 VITALS — BP 134/70 | HR 82 | Temp 97.7°F | Wt 333.0 lb

## 2018-05-09 DIAGNOSIS — J22 Unspecified acute lower respiratory infection: Secondary | ICD-10-CM | POA: Diagnosis not present

## 2018-05-09 MED ORDER — HYDROCOD POLST-CPM POLST ER 10-8 MG/5ML PO SUER
5.0000 mL | Freq: Two times a day (BID) | ORAL | 0 refills | Status: DC | PRN
Start: 2018-05-09 — End: 2018-05-16

## 2018-05-09 MED ORDER — PREDNISONE 50 MG PO TABS: ORAL_TABLET | ORAL | 0 refills | Status: DC

## 2018-05-09 MED ORDER — AZITHROMYCIN 250 MG PO TABS: ORAL_TABLET | ORAL | 0 refills | Status: DC

## 2018-05-09 MED ORDER — GUAIFENESIN ER 600 MG PO TB12: 1200.0000 mg | ORAL_TABLET | Freq: Two times a day (BID) | ORAL | Status: DC

## 2018-05-09 NOTE — Progress Notes (Signed)
HPI:                                                                Tracy Bennett is a 72 y.o. male who presents to [place]: [facility] today for cough  Cough  This is a new problem. The current episode started yesterday. The problem has been gradually worsening. The problem occurs every few minutes. The cough is non-productive. Pertinent negatives include no chest pain, chills, fever, hemoptysis, nasal congestion, sore throat, shortness of breath or wheezing. Exacerbated by: deep breathing. He has tried prescription cough suppressant for the symptoms. The treatment provided mild relief. His past medical history is significant for bronchitis.     No flowsheet data found.    Past Medical History:  Diagnosis Date  . Dyspnea    with exertion  . Dysrhythmia    frequent PVC's  . Frequent urination   . GERD (gastroesophageal reflux disease)   . History of kidney stones   . Hyperlipidemia   . Hypertension   . Kidney stones    x 4   . Sleep apnea    BiPAP   Past Surgical History:  Procedure Laterality Date  . CARPAL TUNNEL RELEASE     Right   . CERVICAL FUSION     cervical fusion times 2  . FOOT TENDON SURGERY  11/14/2011   Dr. Sharol Given  . left knee surgery     ACL repair   . MASS EXCISION N/A 01/24/2018   Procedure: EXCISION OF BACK MASS;  Surgeon: Erroll Luna, MD;  Location: [facility];  Service: General;  Laterality: N/A;  . UMBILICAL HERNIA REPAIR     Social History   Tobacco Use  . Smoking status: Former Smoker    Last attempt to quit: 02/09/1980    Years since quitting: 38.2  . Smokeless tobacco: Former Network engineer Use Topics  . Alcohol use: Yes   family history includes COPD in his mother; Emphysema in his mother; Hypertension in his mother.    ROS: negative except as noted in the HPI  Medications: Current Outpatient Medications  Medication Sig Dispense Refill  . betamethasone dipropionate (DIPROLENE) 0.05 %  ointment Apply to arms and affected areas twice a day. Avoid contact with face. (Patient taking differently: Apply 1 application topically 2 (two) times daily as needed (SKIN IRRITATION). Apply to arms and affected areas twice a day as needed. Avoid contact with face.) 15 g 0  . fesoterodine (TOVIAZ) 8 MG TB24 tablet Take 8 mg by mouth daily.    . finasteride (PROSCAR) 5 MG tablet Take 1 tablet (5 mg total) by mouth daily. 30 tablet 1  . fluocinonide (LIDEX) 0.05 % external solution Apply 1 application topically 2 (two) times daily as needed (DRY SKIN).    . furosemide (LASIX) 20 MG tablet Take 1 tablet (20 mg total) by mouth every other day. (Patient taking differently: Take 20 mg by mouth daily as needed for edema. ) 15 tablet 11  . HYDROcodone-homatropine (HYCODAN) 5-1.5 MG/5ML syrup Take 5 mLs by mouth at bedtime as needed for cough. 120 mL 0  . ibuprofen (ADVIL,MOTRIN) 800 MG tablet Take 1 tablet (800 mg total) by mouth every 8 (eight) hours as needed. 30 tablet 0  .  loratadine (CLARITIN) 10 MG tablet Take 10 mg by mouth daily as needed for allergies.     . pantoprazole (PROTONIX) 40 MG tablet TAKE 1 TABLET (40 MG TOTAL) BY MOUTH DAILY. 90 tablet 2  . simvastatin (ZOCOR) 40 MG tablet TAKE 1 TABLET (40 MG TOTAL) BY MOUTH DAILY AT 6 PM. 90 tablet 1  . Tamsulosin HCl (FLOMAX) 0.4 MG CAPS Take 0.4 mg by mouth daily.     . valsartan-hydrochlorothiazide (DIOVAN-HCT) 80-12.5 MG tablet Take 1 tablet by mouth daily. TAKE 1 TABLET BY MOUTH DAILY. Due for annual exam. Dispense generic, please. 90 tablet 0   No current facility-administered medications for this visit.    Allergies  Allergen Reactions  . Amoxicillin Swelling    Tongue sores  . Contrast Media [Iodinated Diagnostic Agents] Shortness Of Breath  . Lisinopril Cough       Objective:  BP 134/70   Pulse 82   Temp 97.7 F (36.5 C) (Oral)   Wt (!) 333 lb (151 kg)   SpO2 94%   BMI 47.78 kg/m  Gen:  alert, not ill-appearing, no  distress, appropriate for age, obese male HEENT: head normocephalic without obvious abnormality, conjunctiva and cornea clear, oropharynx clear, neck supple, no cervical adenopathy, trachea midline Pulm: Normal work of breathing, normal phonation, clear to auscultation bilaterally, no wheezes, rales or rhonchi, bronchitic cough CV: Normal rate, regular rhythm, s1 and s2 distinct, no murmurs, clicks or rubs  Neuro: alert and oriented x 3, no tremor MSK: extremities atraumatic, normal gait and station, 2+ peripheral edema bilaterally Skin: intact, no rashes on exposed skin, no jaundice, no cyanosis Psych: well-groomed, cooperative, good eye contact, euthymic mood, affect mood-congruent, speech is articulate, and thought processes clear and goal-directed  CLINICAL DATA:  Cough for several weeks, shortness of Breath  EXAM: CHEST  2 VIEW  COMPARISON:  01/19/2017  FINDINGS: Cardiac shadow is stable. Mild aortic calcifications are noted without aneurysmal dilatation. The lungs are well aerated bilaterally without focal infiltrate or sizable effusion. Mild degenerative change thoracic spine is noted.  IMPRESSION: No active cardiopulmonary disease.   Electronically Signed   By: Inez Catalina M.D.   On: 10/29/2017 11:44  No results found for this or any previous visit (from the past 72 hour(s)). No results found.    Assessment and Plan: 72 y.o. male with   Acute lower respiratory infection - Plan: azithromycin (ZITHROMAX Z-PAK) 250 MG tablet, predniSONE (DELTASONE) 50 MG tablet, chlorpheniramine-HYDROcodone (TUSSIONEX) 10-8 MG/5ML SUER, guaiFENesin (MUCINEX) 600 MG 12 hr tablet Afebrile, no tachypnea, no tachycardia, SpO2 94% on RA at rest. Given age, CHF and relative hypoxia on exam I am going to treat him aggressively with macrolide and steroid. Instructed to use expectorant during the day and reserve cough suppressant for bedtime. Cautioned on risk of decreased respiratory drive  and worsening sleep apnea.   Patient education and anticipatory guidance given Patient agrees with treatment plan Follow-up as needed if symptoms worsen or fail to improve  Darlyne Russian PA-C

## 2018-05-09 NOTE — Patient Instructions (Addendum)

## 2018-05-15 ENCOUNTER — Other Ambulatory Visit: Payer: Self-pay | Admitting: Physician Assistant

## 2018-05-15 ENCOUNTER — Ambulatory Visit (INDEPENDENT_AMBULATORY_CARE_PROVIDER_SITE_OTHER): Payer: Medicare Other

## 2018-05-15 ENCOUNTER — Telehealth: Payer: Self-pay

## 2018-05-15 ENCOUNTER — Encounter: Payer: Self-pay | Admitting: Physician Assistant

## 2018-05-15 ENCOUNTER — Telehealth: Payer: Self-pay | Admitting: Physician Assistant

## 2018-05-15 DIAGNOSIS — J181 Lobar pneumonia, unspecified organism: Principal | ICD-10-CM

## 2018-05-15 DIAGNOSIS — R05 Cough: Secondary | ICD-10-CM | POA: Diagnosis not present

## 2018-05-15 DIAGNOSIS — J189 Pneumonia, unspecified organism: Secondary | ICD-10-CM

## 2018-05-15 DIAGNOSIS — R058 Other specified cough: Secondary | ICD-10-CM

## 2018-05-15 HISTORY — DX: Pneumonia, unspecified organism: J18.9

## 2018-05-15 MED ORDER — LEVOFLOXACIN 750 MG PO TABS: 750.0000 mg | ORAL_TABLET | Freq: Every day | ORAL | 0 refills | Status: DC

## 2018-05-15 NOTE — Progress Notes (Signed)
Left lower lobe opacity c/f early pneumonia Multiple risk factors for drug resistance - age>65 - macrolide use in the last 3 months - medical co-morbidites (HTN, LVH, CKD) Treating with Levaquin 750 mg QD for 5 days Personally reviewed ECG from 11/22/17, no QT prolongation (Qtc 405)

## 2018-05-15 NOTE — Telephone Encounter (Signed)
Pt's wife called and said Pt is "feeling much worse." He is now "coughing up green stuff" and has sweats/chills. Was seen in office earlier and advised to call back if not getting better. His wife is coming back today for a chest xray, questions if he can get one too. Routing.

## 2018-05-15 NOTE — Telephone Encounter (Signed)
Pt is requesting a refill of tussionex. Please advise. -EH/RMA

## 2018-05-16 ENCOUNTER — Other Ambulatory Visit: Payer: Self-pay | Admitting: Family Medicine

## 2018-05-16 DIAGNOSIS — J22 Unspecified acute lower respiratory infection: Secondary | ICD-10-CM

## 2018-05-16 MED ORDER — HYDROCOD POLST-CPM POLST ER 10-8 MG/5ML PO SUER: 5.0000 mL | Freq: Two times a day (BID) | ORAL | 0 refills | Status: AC | PRN

## 2018-05-16 NOTE — Progress Notes (Signed)
Refilled Tussionex but told to only use at bedtime can use over-the-counter Delsym during the day.  Have a follow-up on Monday to recheck his lungs.

## 2018-05-16 NOTE — Telephone Encounter (Signed)
I don't recommend using Tussionex for more than 5 days Additionally since there is an active lung infection, it is really important to cough and expectorate. Cough is a protective part of our immune system that promotes healing Recommend he take regular Mucinex (no D or DM) to promote productive cough. He will cough less if the sputum is looser

## 2018-05-16 NOTE — Telephone Encounter (Signed)
Left vm for pt to return call to clinic -EH/RMA  

## 2018-05-17 NOTE — Telephone Encounter (Signed)
Per Kenney Houseman Dr. Suzi Roots spoke with wife during her visit yesterday.  Pt has f/u appointment on Monday. -EH/RMA

## 2018-05-20 ENCOUNTER — Ambulatory Visit (INDEPENDENT_AMBULATORY_CARE_PROVIDER_SITE_OTHER): Payer: Medicare Other | Admitting: Family Medicine

## 2018-05-20 ENCOUNTER — Encounter: Payer: Self-pay | Admitting: Family Medicine

## 2018-05-20 VITALS — BP 124/66 | HR 87 | Ht 70.0 in | Wt 323.0 lb

## 2018-05-20 DIAGNOSIS — J181 Lobar pneumonia, unspecified organism: Secondary | ICD-10-CM

## 2018-05-20 DIAGNOSIS — I700 Atherosclerosis of aorta: Secondary | ICD-10-CM

## 2018-05-20 DIAGNOSIS — R05 Cough: Secondary | ICD-10-CM

## 2018-05-20 DIAGNOSIS — R053 Chronic cough: Secondary | ICD-10-CM

## 2018-05-20 DIAGNOSIS — J189 Pneumonia, unspecified organism: Secondary | ICD-10-CM

## 2018-05-20 NOTE — Progress Notes (Signed)
   Subjective:    Patient ID: Tracy Bennett, male    DOB: 1946-02-09, 72 y.o.   MRN: [id number]  HPI   72 year old male comes in today to follow-up for recent diagnosis of left lower lobe pneumonia.  He was seen by one my partners on June 13.  At the time he was experiencing more of a dry cough.  After several days he did not improve and so a chest x-ray was ordered.  It did show a small focus of patchy opacity in the left lung base suspicious for early pneumonia.  Was also some mild scarring in the left lung base which was stable.  He was started on Levaquin and given a prescription for Tussionex for the cough. Completed his antibiotic yesterday.      Review of Systems     Objective:   Physical Exam  Constitutional: He is oriented to person, place, and time. He appears well-developed and well-nourished.  HENT:  Head: Normocephalic and atraumatic.  Right Ear: External ear normal.  Left Ear: External ear normal.  Nose: Nose normal.  Mouth/Throat: Oropharynx is clear and moist.  TMs and canals are clear.   Eyes: Pupils are equal, round, and reactive to light. Conjunctivae and EOM are normal.  Neck: Neck supple. No thyromegaly present.  Cardiovascular: Normal rate and normal heart sounds.  Pulmonary/Chest: Effort normal and breath sounds normal.  Lymphadenopathy:    He has no cervical adenopathy.  Neurological: He is alert and oriented to person, place, and time.  Skin: Skin is warm and dry.  Psychiatric: He has a normal mood and affect.          Assessment & Plan:  Left lower lobe opacity c/f early pneumonia -he just completed Levaquin yesterday.  He does have some albuterol at home but has not used it because he says it made him feel shaky and jittery.  Discussed with him that it may be 2 more weeks before he feels back to himself.  He still coughing and getting a little bit of sputum but explained that that can continue for several more weeks.  I like to see him back in a month  so that we can schedule spirometry.  He also needs a follow-up chest x-ray in about 3 weeks to write this down for him and went ahead and place the order today.  Aortic atherosclerosis -this was noted on his chest x-ray and discussed the diagnosis with him today.  To use Zocor 40 mg daily as well as maintaining good control of blood pressure and blood glucose.

## 2018-05-20 NOTE — Patient Instructions (Signed)
Around 06/10/18 repeat chest xray.

## 2018-06-10 ENCOUNTER — Ambulatory Visit (INDEPENDENT_AMBULATORY_CARE_PROVIDER_SITE_OTHER): Payer: Medicare Other

## 2018-06-10 DIAGNOSIS — J181 Lobar pneumonia, unspecified organism: Secondary | ICD-10-CM

## 2018-06-10 DIAGNOSIS — J189 Pneumonia, unspecified organism: Secondary | ICD-10-CM

## 2018-06-27 DIAGNOSIS — H6063 Unspecified chronic otitis externa, bilateral: Secondary | ICD-10-CM | POA: Diagnosis not present

## 2018-06-27 DIAGNOSIS — H6123 Impacted cerumen, bilateral: Secondary | ICD-10-CM | POA: Diagnosis not present

## 2018-07-10 ENCOUNTER — Other Ambulatory Visit: Payer: Self-pay | Admitting: Family Medicine

## 2018-07-27 ENCOUNTER — Other Ambulatory Visit: Payer: Self-pay | Admitting: Family Medicine

## 2018-07-30 ENCOUNTER — Other Ambulatory Visit: Payer: Self-pay | Admitting: *Deleted

## 2018-07-30 MED ORDER — VALSARTAN-HYDROCHLOROTHIAZIDE 80-12.5 MG PO TABS: 1.0000 | ORAL_TABLET | Freq: Every day | ORAL | 0 refills | Status: DC

## 2018-08-02 NOTE — Progress Notes (Addendum)
Subjective:   Tracy Bennett is a 72 y.o. male who presents for Medicare Annual/Subsequent preventive examination.  Review of Systems:  No ROS.  Medicare Wellness Visit. Additional risk factors are reflected in the social history.  Cardiac Risk Factors include: dyslipidemia;hypertension Sleep patterns:Sleeps well with by pap on. Geets 6-7 hours of sleep at night Home Safety/Smoke Alarms: Feels safe in home. Smoke alarms in place.  Living environment; Lives with wife in 1 story with basement. Does have handrails and uses these. Has 2 walk in showers with grab bars in place and with bench seat.   Male:   CCS- due 10/01/2023    PSA- urology follows Lab Results  Component Value Date   PSA 0.66 01/29/2014   PSA 3.99 11/02/2011   PSA 3.99 10/16/2011       Objective:    Vitals: BP (!) 157/67   Pulse 75   Ht 5\' 10"  (1.778 m)   Wt (!) 329 lb (149.2 kg)   SpO2 97%   BMI 47.21 kg/m   Body mass index is 47.21 kg/m.  Advanced Directives 08/05/2018 01/21/2018 12/15/2015  Does Patient Have a Medical Advance Directive? Yes Yes No  Type of Advance Directive Living will [place];Living will -  Does patient want to make changes to medical advance directive? Yes (MAU/Ambulatory/Procedural Areas - Information given) - -  Copy of [place] in Chart? - No - copy requested -  Would patient like information on creating a medical advance directive? - - No - patient declined information    Tobacco Social History   Tobacco Use  Smoking Status Former Smoker  . Last attempt to quit: 02/09/1980  . Years since quitting: 38.5  Smokeless Tobacco Former Engineer, structural given: Not Answered   Clinical Intake:      Diabetes: No  How often do you need to have someone help you when you read instructions, pamphlets, or other written materials from your doctor or pharmacy?: 1 - Never  Interpreter Needed?: No     Past Medical History:  Diagnosis Date   . Community acquired pneumonia of left lower lobe of lung ([facility]) 05/15/2018  . Dyspnea    with exertion  . Dysrhythmia    frequent PVC's  . Frequent urination   . GERD (gastroesophageal reflux disease)   . History of kidney stones   . Hyperlipidemia   . Hypertension   . Kidney stones    x 4   . Sleep apnea    BiPAP   Past Surgical History:  Procedure Laterality Date  . CARPAL TUNNEL RELEASE     Right   . CERVICAL FUSION     cervical fusion times 2  . FOOT TENDON SURGERY  11/14/2011   Dr. Sharol Given  . left knee surgery     ACL repair   . LIPOMA EXCISION Bilateral    on back  . MASS EXCISION N/A 01/24/2018   Procedure: EXCISION OF BACK MASS;  Surgeon: Erroll Luna, MD;  Location: [facility];  Service: General;  Laterality: N/A;  . UMBILICAL HERNIA REPAIR     Family History  Problem Relation Age of Onset  . COPD Mother   . Hypertension Mother   . Emphysema Mother    Social History   Socioeconomic History  . Marital status: Married    Spouse name: Tracy Bennett   . Number of children: 1  . Years of education: college  . Highest education level: Not on  file  Occupational History  . Occupation: Retired     Comment: Systems developer  . Financial resource strain: Not hard at all  . Food insecurity:    Worry: Never true    Inability: Never true  . Transportation needs:    Medical: No    Non-medical: No  Tobacco Use  . Smoking status: Former Smoker    Last attempt to quit: 02/09/1980    Years since quitting: 38.5  . Smokeless tobacco: Former Network engineer and Sexual Activity  . Alcohol use: Yes    Comment: occasionally  . Drug use: No  . Sexual activity: Yes  Lifestyle  . Physical activity:    Days per week: 6 days    Minutes per session: 10 min  . Stress: Not at all  Relationships  . Social connections:    Talks on phone: Once a week    Gets together: Once a week    Attends religious service: Patient refused    Active member of club or organization: Yes     Attends meetings of clubs or organizations: More than 4 times per year    Relationship status: Married  Other Topics Concern  . Not on file  Social History Narrative   Golf about 2 times per week.  No regular exercise.  Does use stationary bike everyday around 10 minutes at a time. Therapist, music.    Outpatient Encounter Medications as of 08/05/2018  Medication Sig  . fesoterodine (TOVIAZ) 8 MG TB24 tablet Take 8 mg by mouth daily.  . finasteride (PROSCAR) 5 MG tablet Take 1 tablet (5 mg total) by mouth daily.  . fluocinonide (LIDEX) 0.05 % external solution Apply 1 application topically 2 (two) times daily as needed (DRY SKIN).  . furosemide (LASIX) 20 MG tablet Take 1 tablet (20 mg total) by mouth every other day. (Patient taking differently: Take 20 mg by mouth daily as needed for edema. )  . ibuprofen (ADVIL,MOTRIN) 800 MG tablet Take 1 tablet (800 mg total) by mouth every 8 (eight) hours as needed.  . loratadine (CLARITIN) 10 MG tablet Take 10 mg by mouth daily as needed for allergies.   . pantoprazole (PROTONIX) 40 MG tablet TAKE 1 TABLET (40 MG TOTAL) BY MOUTH DAILY.  . simvastatin (ZOCOR) 40 MG tablet TAKE 1 TABLET (40 MG TOTAL) BY MOUTH DAILY AT 6 PM.  . Tamsulosin HCl (FLOMAX) 0.4 MG CAPS Take 0.4 mg by mouth daily.   . valsartan-hydrochlorothiazide (DIOVAN-HCT) 80-12.5 MG tablet Take 1 tablet by mouth daily.  Marland Kitchen guaiFENesin (MUCINEX) 600 MG 12 hr tablet Take 2 tablets (1,200 mg total) by mouth 2 (two) times daily. (Patient not taking: Reported on 08/05/2018)   No facility-administered encounter medications on file as of 08/05/2018.     Activities of Daily Living   Patient Care Team: Hali Marry, MD as PCP - General (Family Medicine) Sonnie Alamo, MD as Referring Physician (Gastroenterology) Bethann Punches, MD as Referring Physician (Urology) Deboraha Sprang, MD as Consulting Physician (Cardiology) Magnus Sinning, MD as Consulting Physician    Assessment:   This is a routine wellness examination for [place].Physical assessment deferred to PCP.   Exercise Activities and Dietary recommendations Dietin general, a "healthy" diet   Breakfast:biggest meal of day Lunch:  Dinner: fetticini with salad   Water intake is 3 20oz. Bottles a day.    Goals    . DIET - INCREASE WATER INTAKE  Increase water intake to 5 bottles of water daily.       Fall Risk Fall Risk  08/05/2018 08/02/2018 05/20/2018 01/09/2017 01/29/2014  Falls in the past year? No No No No No    Depression Screen PHQ 2/9 Scores 08/05/2018 05/20/2018 02/14/2017 01/09/2017  PHQ - 2 Score 0 4 0 0  PHQ- 9 Score - 9 - -    Cognitive Function MMSE - Mini Mental State Exam 08/05/2018  Orientation to time 5  Orientation to Place 5  Registration 3  Attention/ Calculation 5  Recall 3  Language- name 2 objects 2  Language- repeat 1  Language- follow 3 step command 3  Language- read & follow direction 1  Write a sentence 1  Copy design 1  Total score 30     6CIT Screen 02/14/2017  What Year? 0 points  What month? 0 points  What time? 0 points  Count back from 20 0 points  Months in reverse 0 points  Repeat phrase 4 points  Total Score 4    Immunization History  Administered Date(s) Administered  . Influenza Split 11/27/2012, 12/16/2012  . Influenza, Seasonal, Injecte, Preservative Fre 12/11/2013  . Influenza,inj,Quad PF,6+ Mos 09/01/2014, 08/27/2015, 08/27/2016  . Influenza-Unspecified 09/24/2017  . Pneumococcal Conjugate-13 02/14/2017  . Pneumococcal Polysaccharide-23 02/09/2012  . Tdap 01/28/2013  . Zoster 09/15/2011    Screening Tests Health Maintenance  Topic Date Due  . INFLUENZA VACCINE  06/27/2018  . TETANUS/TDAP  01/29/2023  . COLONOSCOPY  10/01/2023  . Hepatitis C Screening  Completed  . PNA vac Low Risk Adult  Completed        Plan:  Please schedule your next medicare wellness visit with me in 1 yr.  Mr. Kesinger , Thank you for taking  time to come for your Medicare Wellness Visit. I appreciate your ongoing commitment to your health goals. Please review the following plan we discussed and let me know if I can assist you in the future.   These are the goals we discussed: Goals   None     This is a list of the screening recommended for you and due dates:  Health Maintenance  Topic Date Due  . Flu Shot  06/27/2018  . Tetanus Vaccine  01/29/2023  . Colon Cancer Screening  10/01/2023  .  Hepatitis C: One time screening is recommended by Center for Disease Control  (CDC) for  adults born from [birth year] through 1965.   Completed  . Pneumonia vaccines  Completed     I have personally reviewed and noted the following in the patient's chart:   . Medical and social history . Use of alcohol, tobacco or illicit drugs  . Current medications and supplements . Functional ability and status . Nutritional status . Physical activity . Advanced directives . List of other physicians . Hospitalizations, surgeries, and ER visits in previous 12 months . Vitals . Screenings to include cognitive, depression, and falls . Referrals and appointments  In addition, I have reviewed and discussed with patient certain preventive protocols, quality metrics, and best practice recommendations. A written personalized care plan for preventive services as well as general preventive health recommendations were provided to patient.     Beatrice Lecher, MD  08/06/2018

## 2018-08-02 NOTE — Progress Notes (Deleted)
Subjective:   Tracy Bennett is a 72 y.o. male who presents for Medicare Annual/Subsequent preventive examination.  Review of Systems:  No ROS.  Medicare Wellness Visit. Additional risk factors are reflected in the social history.  Cardiac Risk Factors include: hypertension;dyslipidemia Sleep patterns: has interrupted sleep, feels rested on waking, does not get up to void, gets up 1 times nightly to void and sleeps 6 hours nightly.   Home Safety/Smoke Alarms: Feels safe in home. Smoke alarms in place.  Living environmen:    Male:   Pap-       Mammo-       Dexa scan-        CCS-  Male:   CCS-     PSA-  Lab Results  Component Value Date   PSA 0.66 01/29/2014   PSA 3.99 11/02/2011   PSA 3.99 10/16/2011       Objective:    Vitals: There were no vitals taken for this visit.  There is no height or weight on file to calculate BMI.  Advanced Directives 01/21/2018 12/15/2015  Does Patient Have a Medical Advance Directive? Yes No  Type of Paramedic of [facility];Living will -  Copy of [place] in Chart? No - copy requested -  Would patient like information on creating a medical advance directive? - No - patient declined information    Tobacco Social History   Tobacco Use  Smoking Status Former Smoker  . Last attempt to quit: 02/09/1980  . Years since quitting: 38.5  Smokeless Tobacco Former Engineer, structural given: Not Answered   Clinical Intake:  Pre-visit preparation completed: Yes  Pain : No/denies pain  Past Medical History:  Diagnosis Date  . Community acquired pneumonia of left lower lobe of lung ([place]) 05/15/2018  . Dyspnea    with exertion  . Dysrhythmia    frequent PVC's  . Frequent urination   . GERD (gastroesophageal reflux disease)   . History of kidney stones   . Hyperlipidemia   . Hypertension   . Kidney stones    x 4   . Sleep apnea    BiPAP   Past Surgical History:  Procedure Laterality  Date  . CARPAL TUNNEL RELEASE     Right   . CERVICAL FUSION     cervical fusion times 2  . FOOT TENDON SURGERY  11/14/2011   Dr. Sharol Given  . left knee surgery     ACL repair   . MASS EXCISION N/A 01/24/2018   Procedure: EXCISION OF BACK MASS;  Surgeon: Erroll Luna, MD;  Location: [facility];  Service: General;  Laterality: N/A;  . UMBILICAL HERNIA REPAIR     Family History  Problem Relation Age of Onset  . COPD Mother   . Hypertension Mother   . Emphysema Mother    Social History   Socioeconomic History  . Marital status: Married    Spouse name: Rose   . Number of children: 1  . Years of education: Not on file  . Highest education level: Not on file  Occupational History  . Occupation: Retired     Comment: Systems developer  . Financial resource strain: Not on file  . Food insecurity:    Worry: Not on file    Inability: Not on file  . Transportation needs:    Medical: Not on file    Non-medical: Not on file  Tobacco Use  . Smoking status:  Former Smoker    Last attempt to quit: 02/09/1980    Years since quitting: 38.5  . Smokeless tobacco: Former Network engineer and Sexual Activity  . Alcohol use: Yes  . Drug use: No  . Sexual activity: Yes  Lifestyle  . Physical activity:    Days per week: Not on file    Minutes per session: Not on file  . Stress: Not on file  Relationships  . Social connections:    Talks on phone: Not on file    Gets together: Not on file    Attends religious service: Not on file    Active member of club or organization: Not on file    Attends meetings of clubs or organizations: Not on file    Relationship status: Not on file  Other Topics Concern  . Not on file  Social History Narrative   Golf about 2 times per week.  No regular exercise.     Outpatient Encounter Medications as of 08/05/2018  Medication Sig  . fesoterodine (TOVIAZ) 8 MG TB24 tablet Take 8 mg by mouth daily.  . finasteride (PROSCAR) 5 MG tablet Take 1 tablet (5 mg  total) by mouth daily.  . fluocinonide (LIDEX) 0.05 % external solution Apply 1 application topically 2 (two) times daily as needed (DRY SKIN).  . furosemide (LASIX) 20 MG tablet Take 1 tablet (20 mg total) by mouth every other day. (Patient taking differently: Take 20 mg by mouth daily as needed for edema. )  . guaiFENesin (MUCINEX) 600 MG 12 hr tablet Take 2 tablets (1,200 mg total) by mouth 2 (two) times daily.  Marland Kitchen ibuprofen (ADVIL,MOTRIN) 800 MG tablet Take 1 tablet (800 mg total) by mouth every 8 (eight) hours as needed.  . loratadine (CLARITIN) 10 MG tablet Take 10 mg by mouth daily as needed for allergies.   . pantoprazole (PROTONIX) 40 MG tablet TAKE 1 TABLET (40 MG TOTAL) BY MOUTH DAILY.  . simvastatin (ZOCOR) 40 MG tablet TAKE 1 TABLET (40 MG TOTAL) BY MOUTH DAILY AT 6 PM.  . Tamsulosin HCl (FLOMAX) 0.4 MG CAPS Take 0.4 mg by mouth daily.   . valsartan-hydrochlorothiazide (DIOVAN-HCT) 80-12.5 MG tablet Take 1 tablet by mouth daily.   No facility-administered encounter medications on file as of 08/05/2018.     Activities of Daily Living In your present state of health, do you have any difficulty performing the following activities: 08/02/2018 01/21/2018  Hearing? N -  Vision? N -  Difficulty concentrating or making decisions? N -  Walking or climbing stairs? Y N  Dressing or bathing? N -  Doing errands, shopping? N -  Preparing Food and eating ? N -  Using the Toilet? N -  In the past six months, have you accidently leaked urine? N -  Do you have problems with loss of bowel control? N -  Managing your Medications? N -  Managing your Finances? N -  Housekeeping or managing your Housekeeping? N -  Some recent data might be hidden    Patient Care Team: Hali Marry, MD as PCP - General (Family Medicine) Sonnie Alamo, MD as Referring Physician (Gastroenterology) Bethann Punches, MD as Referring Physician (Urology) Deboraha Sprang, MD as Consulting Physician  (Cardiology) Magnus Sinning, MD as Consulting Physician   Assessment:   This is a routine wellness examination for [place]. Physical assessment deferred to PCP.   Exercise Activities and Dietary recommendations Current Exercise Habits: Home exercise routine, Type of exercise: walking;strength training/weights,  Time (Minutes): 20, Frequency (Times/Week): 3, Weekly Exercise (Minutes/Week): 60, Intensity: Mild, Exercise limited by: None identified Diet (meal preparation, eat out, water intake, caffeinated beverages, dairy products, fruits and vegetables): in general, a "healthy" diet   Breakfast: Lunch:  Dinner:       Goals    . DIET - REDUCE PORTION SIZE     Use smaller plattes       Fall Risk Fall Risk  08/02/2018 05/20/2018 01/09/2017 01/29/2014 01/29/2014  Falls in the past year? No No No No No   Is the patient's home free of loose throw rugs in walkways, pet beds, electrical cords, etc?   no      Grab bars in the bathroom? no      Handrails on the stairs?   yes      Adequate lighting?   yes  Depression Screen PHQ 2/9 Scores 05/20/2018 02/14/2017 01/09/2017 01/29/2014  PHQ - 2 Score 4 0 0 0  PHQ- 9 Score 9 - - -    Cognitive Function     6CIT Screen 02/14/2017  What Year? 0 points  What month? 0 points  What time? 0 points  Count back from 20 0 points  Months in reverse 0 points  Repeat phrase 4 points  Total Score 4    Immunization History  Administered Date(s) Administered  . Influenza Split 11/27/2012, 12/16/2012  . Influenza, Seasonal, Injecte, Preservative Fre 12/11/2013  . Influenza,inj,Quad PF,6+ Mos 09/01/2014, 08/27/2015, 08/27/2016  . Influenza-Unspecified 09/24/2017  . Pneumococcal Conjugate-13 02/14/2017  . Pneumococcal Polysaccharide-23 02/09/2012  . Tdap 01/28/2013  . Zoster 09/15/2011    Screening Tests Health Maintenance  Topic Date Due  . INFLUENZA VACCINE  06/27/2018  . TETANUS/TDAP  01/29/2023  . COLONOSCOPY  10/01/2023  . Hepatitis C  Screening  Completed  . PNA vac Low Risk Adult  Completed   Cancer Screenings: Lung: Low Dose CT Chest recommended if Age 24-80 years, 30 pack-year currently smoking OR have quit w/in 15years. Patient does not qualify. Colorectal: up to date       Plan:     I have personally reviewed and noted the following in the patient's chart:   . Medical and social history . Use of alcohol, tobacco or illicit drugs  . Current medications and supplements . Functional ability and status . Nutritional status . Physical activity . Advanced directives . List of other physicians . Hospitalizations, surgeries, and ER visits in previous 12 months . Vitals . Screenings to include cognitive, depression, and falls . Referrals and appointments  In addition, I have reviewed and discussed with patient certain preventive protocols, quality metrics, and best practice recommendations. A written personalized care plan for preventive services as well as general preventive health recommendations were provided to patient.     Joanne Chars, LPN  02/01/487

## 2018-08-05 ENCOUNTER — Ambulatory Visit (INDEPENDENT_AMBULATORY_CARE_PROVIDER_SITE_OTHER): Payer: Medicare Other | Admitting: Family Medicine

## 2018-08-05 VITALS — BP 157/67 | HR 75 | Ht 70.0 in | Wt 329.0 lb

## 2018-08-05 DIAGNOSIS — Z0000 Encounter for general adult medical examination without abnormal findings: Secondary | ICD-10-CM

## 2018-08-05 NOTE — Patient Instructions (Signed)

## 2018-08-06 NOTE — Progress Notes (Signed)
Agree with documentation as above. Chart and note  Reviewed for completeness.    Beatrice Lecher, MD

## 2018-08-13 ENCOUNTER — Ambulatory Visit (INDEPENDENT_AMBULATORY_CARE_PROVIDER_SITE_OTHER): Payer: Medicare Other | Admitting: Family Medicine

## 2018-08-13 ENCOUNTER — Encounter: Payer: Self-pay | Admitting: Family Medicine

## 2018-08-13 VITALS — BP 133/86 | HR 82 | Ht 70.0 in | Wt 327.0 lb

## 2018-08-13 DIAGNOSIS — I10 Essential (primary) hypertension: Secondary | ICD-10-CM | POA: Diagnosis not present

## 2018-08-13 DIAGNOSIS — E785 Hyperlipidemia, unspecified: Secondary | ICD-10-CM | POA: Diagnosis not present

## 2018-08-13 DIAGNOSIS — E119 Type 2 diabetes mellitus without complications: Secondary | ICD-10-CM | POA: Diagnosis not present

## 2018-08-13 LAB — BASIC METABOLIC PANEL WITH GFR
BUN / CREAT RATIO: 11 (calc) (ref 6–22)
BUN: 14 mg/dL (ref 7–25)
CO2: 27 mmol/L (ref 20–32)
CREATININE: 1.28 mg/dL — AB (ref 0.70–1.18)
Calcium: 9.9 mg/dL (ref 8.6–10.3)
Chloride: 101 mmol/L (ref 98–110)
GFR, EST AFRICAN AMERICAN: 64 mL/min/{1.73_m2} (ref >=60)
GFR, EST NON AFRICAN AMERICAN: 56 mL/min/{1.73_m2} — AB (ref >=60)
Glucose, Bld: 128 mg/dL — ABNORMAL HIGH (ref 65–99)
POTASSIUM: 4.2 mmol/L (ref 3.5–5.3)
SODIUM: 138 mmol/L (ref 135–146)

## 2018-08-13 LAB — POCT GLYCOSYLATED HEMOGLOBIN (HGB A1C): HEMOGLOBIN A1C: 6.9 % — AB (ref 4.0–5.6)

## 2018-08-13 MED ORDER — BLOOD GLUCOSE MONITOR KIT: PACK | 0 refills | Status: DC

## 2018-08-13 NOTE — Patient Instructions (Signed)
Diabetes Mellitus and Nutrition When you have diabetes (diabetes mellitus), it is very important to have healthy eating habits because your blood sugar (glucose) levels are greatly affected by what you eat and drink. Eating healthy foods in the appropriate amounts, at about the same times every day, can help you:  Control your blood glucose.  Lower your risk of heart disease.  Improve your blood pressure.  Reach or maintain a healthy weight.  Every person with diabetes is different, and each person has different needs for a meal plan. Your health care provider may recommend that you work with a diet and nutrition specialist (dietitian) to make a meal plan that is best for you. Your meal plan may vary depending on factors such as:  The calories you need.  The medicines you take.  Your weight.  Your blood glucose, blood pressure, and cholesterol levels.  Your activity level.  Other health conditions you have, such as heart or kidney disease.  How do carbohydrates affect me? Carbohydrates affect your blood glucose level more than any other type of food. Eating carbohydrates naturally increases the amount of glucose in your blood. Carbohydrate counting is a method for keeping track of how many carbohydrates you eat. Counting carbohydrates is important to keep your blood glucose at a healthy level, especially if you use insulin or take certain oral diabetes medicines. It is important to know how many carbohydrates you can safely have in each meal. This is different for every person. Your dietitian can help you calculate how many carbohydrates you should have at each meal and for snack. Foods that contain carbohydrates include:  Bread, cereal, rice, pasta, and crackers.  Potatoes and corn.  Peas, beans, and lentils.  Milk and yogurt.  Fruit and juice.  Desserts, such as cakes, cookies, ice cream, and candy.  How does alcohol affect me? Alcohol can cause a sudden decrease in blood  glucose (hypoglycemia), especially if you use insulin or take certain oral diabetes medicines. Hypoglycemia can be a life-threatening condition. Symptoms of hypoglycemia (sleepiness, dizziness, and confusion) are similar to symptoms of having too much alcohol. If your health care provider says that alcohol is safe for you, follow these guidelines:  Limit alcohol intake to no more than 1 drink per day for nonpregnant women and 2 drinks per day for men. One drink equals 12 oz of beer, 5 oz of wine, or 1 oz of hard liquor.  Do not drink on an empty stomach.  Keep yourself hydrated with water, diet soda, or unsweetened iced tea.  Keep in mind that regular soda, juice, and other mixers may contain a lot of sugar and must be counted as carbohydrates.  What are tips for following this plan? Reading food labels  Start by checking the serving size on the label. The amount of calories, carbohydrates, fats, and other nutrients listed on the label are based on one serving of the food. Many foods contain more than one serving per package.  Check the total grams (g) of carbohydrates in one serving. You can calculate the number of servings of carbohydrates in one serving by dividing the total carbohydrates by 15. For example, if a food has 30 g of total carbohydrates, it would be equal to 2 servings of carbohydrates.  Check the number of grams (g) of saturated and trans fats in one serving. Choose foods that have low or no amount of these fats.  Check the number of milligrams (mg) of sodium in one serving. Most people   should limit total sodium intake to less than 2,300 mg per day.  Always check the nutrition information of foods labeled as "low-fat" or "nonfat". These foods may be higher in added sugar or refined carbohydrates and should be avoided.  Talk to your dietitian to identify your daily goals for nutrients listed on the label. Shopping  Avoid buying canned, premade, or processed foods. These  foods tend to be high in fat, sodium, and added sugar.  Shop around the outside edge of the grocery store. This includes fresh fruits and vegetables, bulk grains, fresh meats, and fresh dairy. Cooking  Use low-heat cooking methods, such as baking, instead of high-heat cooking methods like deep frying.  Cook using healthy oils, such as olive, canola, or sunflower oil.  Avoid cooking with butter, cream, or high-fat meats. Meal planning  Eat meals and snacks regularly, preferably at the same times every day. Avoid going long periods of time without eating.  Eat foods high in fiber, such as fresh fruits, vegetables, beans, and whole grains. Talk to your dietitian about how many servings of carbohydrates you can eat at each meal.  Eat 4-6 ounces of lean protein each day, such as lean meat, chicken, fish, eggs, or tofu. 1 ounce is equal to 1 ounce of meat, chicken, or fish, 1 egg, or 1/4 cup of tofu.  Eat some foods each day that contain healthy fats, such as avocado, nuts, seeds, and fish. Lifestyle   Check your blood glucose regularly.  Exercise at least 30 minutes 5 or more days each week, or as told by your health care provider.  Take medicines as told by your health care provider.  Do not use any products that contain nicotine or tobacco, such as cigarettes and e-cigarettes. If you need help quitting, ask your health care provider.  Work with a counselor or diabetes educator to identify strategies to manage stress and any emotional and social challenges. What are some questions to ask my health care provider?  Do I need to meet with a diabetes educator?  Do I need to meet with a dietitian?  What number can I call if I have questions?  When are the best times to check my blood glucose? Where to find more information:  American Diabetes Association: diabetes.org/food-and-fitness/food  Academy of Nutrition and Dietetics:  www.eatright.org/resources/health/diseases-and-conditions/diabetes  National Institute of Diabetes and Digestive and Kidney Diseases (NIH): www.niddk.nih.gov/health-information/diabetes/overview/diet-eating-physical-activity Summary  A healthy meal plan will help you control your blood glucose and maintain a healthy lifestyle.  Working with a diet and nutrition specialist (dietitian) can help you make a meal plan that is best for you.  Keep in mind that carbohydrates and alcohol have immediate effects on your blood glucose levels. It is important to count carbohydrates and to use alcohol carefully. This information is not intended to replace advice given to you by your health care provider. Make sure you discuss any questions you have with your health care provider. Document Released: 08/10/2005 Document Revised: 12/18/2016 Document Reviewed: 12/18/2016 Elsevier Interactive Patient Education  2018 Elsevier Inc.  

## 2018-08-13 NOTE — Progress Notes (Signed)
Subjective:    CC:   HPI: Impaired fasting glucose-no increased thirst or urination. No symptoms consistent with hypoglycemia.  Several years ago he had actually been enrolled in a weight loss program over at [facility].  He had gone down to about 300 pounds and was using my fitness pal and was actually doing really well but says eventually just got off track.  He has lots of sweets in his house including cakes, honey bones candy.  He snacks on those almost every day.  They also go out to eat a lot.  Hypertension- Pt denies chest pain, SOB, dizziness, or heart palpitations.  Taking meds as directed w/o problems.  Denies medication side effects.    F/U hyperlipidemia - tolerating statin well with no S.E. Last lipid in December.    Past medical history, Surgical history, Family history not pertinant except as noted below, Social history, Allergies, and medications have been entered into the medical record, reviewed, and corrections made.   Review of Systems: No fevers, chills, night sweats, weight loss, chest pain, or shortness of breath.   Objective:    General: Well Developed, well nourished, and in no acute distress.  Neuro: Alert and oriented x3, extra-ocular muscles intact, sensation grossly intact.  HEENT: Normocephalic, atraumatic  Skin: Warm and dry, no rashes. Cardiac: Regular rate and rhythm, no murmurs rubs or gallops, no lower extremity edema.  Respiratory: Clear to auscultation bilaterally. Not using accessory muscles, speaking in full sentences.   Impression and Recommendations:    Diabetes, new diagnosis.  Previously IFG but now A1C is up to 6.9.  Discussed tarting medication in addition to healthy diet and weight loss.  He really wants 3 months to work on his diet and weight loss and really cut out some of the sweets and sugars.  I think it reasonable to give him 90 days to do this but if at that point his A1c is not at goal then we will need to start medication and we  discussed clear expectations around this.  Also recommend referral for diabetic nutrition counseling. He wants to hold off.   He is already on an arm and a statin. F/U in 36months.  Packet of information provided about diabetes and a 2000 a day calorie diet.  HTN - Well controlled. Continue current regimen. Follow up in  3 months.    Hyperlipidemia-work on dietary changes I think just cutting out the sweets along will make a big difference in his triglycerides as well as his LDL.  We discussed the goal of getting back down to about 300 pounds.

## 2018-08-14 DIAGNOSIS — H26491 Other secondary cataract, right eye: Secondary | ICD-10-CM | POA: Diagnosis not present

## 2018-08-14 DIAGNOSIS — H02834 Dermatochalasis of left upper eyelid: Secondary | ICD-10-CM | POA: Diagnosis not present

## 2018-08-14 DIAGNOSIS — Z9842 Cataract extraction status, left eye: Secondary | ICD-10-CM | POA: Diagnosis not present

## 2018-08-14 DIAGNOSIS — Z961 Presence of intraocular lens: Secondary | ICD-10-CM | POA: Diagnosis not present

## 2018-08-14 DIAGNOSIS — H02831 Dermatochalasis of right upper eyelid: Secondary | ICD-10-CM | POA: Diagnosis not present

## 2018-08-14 DIAGNOSIS — Z9841 Cataract extraction status, right eye: Secondary | ICD-10-CM | POA: Diagnosis not present

## 2018-08-14 DIAGNOSIS — Z9889 Other specified postprocedural states: Secondary | ICD-10-CM | POA: Diagnosis not present

## 2018-08-14 DIAGNOSIS — H04123 Dry eye syndrome of bilateral lacrimal glands: Secondary | ICD-10-CM | POA: Insufficient documentation

## 2018-08-14 DIAGNOSIS — H17813 Minor opacity of cornea, bilateral: Secondary | ICD-10-CM | POA: Diagnosis not present

## 2018-08-14 DIAGNOSIS — Z9849 Cataract extraction status, unspecified eye: Secondary | ICD-10-CM | POA: Diagnosis not present

## 2018-08-27 ENCOUNTER — Other Ambulatory Visit: Payer: Self-pay | Admitting: Family Medicine

## 2018-09-09 ENCOUNTER — Other Ambulatory Visit: Payer: Self-pay | Admitting: Family Medicine

## 2018-09-09 DIAGNOSIS — E119 Type 2 diabetes mellitus without complications: Secondary | ICD-10-CM

## 2018-09-16 ENCOUNTER — Other Ambulatory Visit: Payer: Self-pay

## 2018-09-16 MED ORDER — GLUCOSE BLOOD VI STRP: ORAL_STRIP | 11 refills | Status: DC

## 2018-09-17 DIAGNOSIS — Z23 Encounter for immunization: Secondary | ICD-10-CM | POA: Diagnosis not present

## 2018-09-18 DIAGNOSIS — Z961 Presence of intraocular lens: Secondary | ICD-10-CM | POA: Diagnosis not present

## 2018-09-18 DIAGNOSIS — H04123 Dry eye syndrome of bilateral lacrimal glands: Secondary | ICD-10-CM | POA: Diagnosis not present

## 2018-09-18 DIAGNOSIS — Z9841 Cataract extraction status, right eye: Secondary | ICD-10-CM | POA: Diagnosis not present

## 2018-09-18 DIAGNOSIS — H26492 Other secondary cataract, left eye: Secondary | ICD-10-CM | POA: Diagnosis not present

## 2018-09-18 DIAGNOSIS — H02831 Dermatochalasis of right upper eyelid: Secondary | ICD-10-CM | POA: Diagnosis not present

## 2018-09-18 DIAGNOSIS — H02834 Dermatochalasis of left upper eyelid: Secondary | ICD-10-CM | POA: Diagnosis not present

## 2018-09-18 DIAGNOSIS — Z48810 Encounter for surgical aftercare following surgery on the sense organs: Secondary | ICD-10-CM | POA: Diagnosis not present

## 2018-09-18 DIAGNOSIS — H1013 Acute atopic conjunctivitis, bilateral: Secondary | ICD-10-CM | POA: Diagnosis not present

## 2018-09-18 DIAGNOSIS — Z9842 Cataract extraction status, left eye: Secondary | ICD-10-CM | POA: Diagnosis not present

## 2018-09-18 DIAGNOSIS — J302 Other seasonal allergic rhinitis: Secondary | ICD-10-CM | POA: Diagnosis not present

## 2018-09-18 DIAGNOSIS — Z9889 Other specified postprocedural states: Secondary | ICD-10-CM | POA: Insufficient documentation

## 2018-09-18 DIAGNOSIS — Z87891 Personal history of nicotine dependence: Secondary | ICD-10-CM | POA: Diagnosis not present

## 2018-10-22 ENCOUNTER — Other Ambulatory Visit: Payer: Self-pay | Admitting: Family Medicine

## 2018-10-23 DIAGNOSIS — Z48810 Encounter for surgical aftercare following surgery on the sense organs: Secondary | ICD-10-CM | POA: Diagnosis not present

## 2018-10-23 DIAGNOSIS — Z9889 Other specified postprocedural states: Secondary | ICD-10-CM | POA: Diagnosis not present

## 2018-10-23 DIAGNOSIS — H0102B Squamous blepharitis left eye, upper and lower eyelids: Secondary | ICD-10-CM | POA: Diagnosis not present

## 2018-10-23 DIAGNOSIS — H1013 Acute atopic conjunctivitis, bilateral: Secondary | ICD-10-CM | POA: Diagnosis not present

## 2018-10-23 DIAGNOSIS — Z9841 Cataract extraction status, right eye: Secondary | ICD-10-CM | POA: Diagnosis not present

## 2018-10-23 DIAGNOSIS — H04123 Dry eye syndrome of bilateral lacrimal glands: Secondary | ICD-10-CM | POA: Diagnosis not present

## 2018-10-23 DIAGNOSIS — H02836 Dermatochalasis of left eye, unspecified eyelid: Secondary | ICD-10-CM | POA: Diagnosis not present

## 2018-10-23 DIAGNOSIS — H02833 Dermatochalasis of right eye, unspecified eyelid: Secondary | ICD-10-CM | POA: Diagnosis not present

## 2018-10-23 DIAGNOSIS — Z9842 Cataract extraction status, left eye: Secondary | ICD-10-CM | POA: Diagnosis not present

## 2018-10-23 DIAGNOSIS — H0102A Squamous blepharitis right eye, upper and lower eyelids: Secondary | ICD-10-CM | POA: Diagnosis not present

## 2018-10-23 DIAGNOSIS — Z961 Presence of intraocular lens: Secondary | ICD-10-CM | POA: Diagnosis not present

## 2018-10-30 DIAGNOSIS — L820 Inflamed seborrheic keratosis: Secondary | ICD-10-CM | POA: Diagnosis not present

## 2018-10-30 DIAGNOSIS — L570 Actinic keratosis: Secondary | ICD-10-CM | POA: Diagnosis not present

## 2018-10-30 DIAGNOSIS — L218 Other seborrheic dermatitis: Secondary | ICD-10-CM | POA: Diagnosis not present

## 2018-10-30 DIAGNOSIS — Z86008 Personal history of in-situ neoplasm of other site: Secondary | ICD-10-CM | POA: Diagnosis not present

## 2018-11-12 ENCOUNTER — Ambulatory Visit (INDEPENDENT_AMBULATORY_CARE_PROVIDER_SITE_OTHER): Payer: Medicare Other | Admitting: Family Medicine

## 2018-11-12 ENCOUNTER — Encounter: Payer: Self-pay | Admitting: Family Medicine

## 2018-11-12 VITALS — BP 138/72 | HR 79 | Ht 70.0 in | Wt 306.5 lb

## 2018-11-12 DIAGNOSIS — Z6841 Body Mass Index (BMI) 40.0 and over, adult: Secondary | ICD-10-CM

## 2018-11-12 DIAGNOSIS — E119 Type 2 diabetes mellitus without complications: Secondary | ICD-10-CM

## 2018-11-12 DIAGNOSIS — I10 Essential (primary) hypertension: Secondary | ICD-10-CM

## 2018-11-12 LAB — POCT GLYCOSYLATED HEMOGLOBIN (HGB A1C): Hemoglobin A1C: 6 % — AB (ref 4.0–5.6)

## 2018-11-12 NOTE — Progress Notes (Signed)
Subjective:    CC: DM   HPI:  Diabetes - no hypoglycemic events. No wounds or sores that are not healing well. No increased thirst or urination. Checking glucose at home.  Opted to work on diet and exercise and hold off on medication.  I told him I will give him 90 days to really make some major changes.  He said he is really tried hard to cut out sugary foods and fried foods.  He said he is been eating a lot more vegetables and salads.    Obesity - He is noticed that he is lost a significant amount of weight.  Overall he feels better.  He is noticed an improvement in his stamina when he is out and about.  He is noticed less joint pain and knee pain.  He has not started back to the gym yet but has been tried to be active out in the yard.  7 days ave of [address], 14 day [address], 30 days [address].   Hypertension- Pt denies chest pain, SOB, dizziness, or heart palpitations.  Taking meds as directed w/o problems.  Denies medication side effects.    BP 138/72   Pulse 79   Ht '5\' 10"'  (1.778 m)   Wt (!) 306 lb 8 oz (139 kg)   BMI 43.98 kg/m     Allergies  Allergen Reactions  . Amoxicillin Swelling    Tongue sores  . Iodinated Diagnostic Agents Shortness Of Breath and Rash    Patient gets hot and sweating  . Lisinopril Cough and Rash    Other reaction(s): Cough    Past Medical History:  Diagnosis Date  . Community acquired pneumonia of left lower lobe of lung ([place]) 05/15/2018  . Dyspnea    with exertion  . Dysrhythmia    frequent PVC's  . Frequent urination   . GERD (gastroesophageal reflux disease)   . History of kidney stones   . Hyperlipidemia   . Hypertension   . Kidney stones    x 4   . Sleep apnea    BiPAP    Past Surgical History:  Procedure Laterality Date  . CARPAL TUNNEL RELEASE     Right   . CERVICAL FUSION     cervical fusion times 2  . FOOT TENDON SURGERY  11/14/2011   Dr. Sharol Given  . left knee surgery     ACL repair   . LIPOMA EXCISION Bilateral    on back  .  MASS EXCISION N/A 01/24/2018   Procedure: EXCISION OF BACK MASS;  Surgeon: Erroll Luna, MD;  Location: [facility];  Service: General;  Laterality: N/A;  . UMBILICAL HERNIA REPAIR      Social History   Socioeconomic History  . Marital status: Married    Spouse name: Rose   . Number of children: 1  . Years of education: college  . Highest education level: Not on file  Occupational History  . Occupation: Retired     Comment: Systems developer  . Financial resource strain: Not hard at all  . Food insecurity:    Worry: Never true    Inability: Never true  . Transportation needs:    Medical: No    Non-medical: No  Tobacco Use  . Smoking status: Former Smoker    Last attempt to quit: 02/09/1980    Years since quitting: 38.7  . Smokeless tobacco: Former Network engineer and Sexual Activity  . Alcohol use: Yes  Comment: occasionally  . Drug use: No  . Sexual activity: Yes  Lifestyle  . Physical activity:    Days per week: 6 days    Minutes per session: 10 min  . Stress: Not at all  Relationships  . Social connections:    Talks on phone: Once a week    Gets together: Once a week    Attends religious service: Patient refused    Active member of club or organization: Yes    Attends meetings of clubs or organizations: More than 4 times per year    Relationship status: Married  . Intimate partner violence:    Fear of current or ex partner: No    Emotionally abused: No    Physically abused: No    Forced sexual activity: No  Other Topics Concern  . Not on file  Social History Narrative   Golf about 2 times per week.  No regular exercise.  Does use stationary bike everyday around 10 minutes at a time. Therapist, music.    Family History  Problem Relation Age of Onset  . COPD Mother   . Hypertension Mother   . Emphysema Mother     Outpatient Encounter Medications as of 11/12/2018  Medication Sig  . ACCU-CHEK FASTCLIX LANCETS MISC For testing blood  sugars daily. Dx: E11.9  . blood glucose meter kit and supplies KIT Dispense based on patient and insurance preference. Use up to once a day as directed. (FOR ICD-9 250.00, 250.01).  . fesoterodine (TOVIAZ) 8 MG TB24 tablet Take 8 mg by mouth daily.  . finasteride (PROSCAR) 5 MG tablet Take 1 tablet (5 mg total) by mouth daily.  . fluocinonide (LIDEX) 0.05 % external solution Apply 1 application topically 2 (two) times daily as needed (DRY SKIN).  . furosemide (LASIX) 20 MG tablet Take 1 tablet (20 mg total) by mouth every other day. (Patient taking differently: Take 20 mg by mouth daily as needed for edema. )  . glucose blood (ACCU-CHEK GUIDE) test strip Check fasting blood sugar every morning. Dx: E11.9  . ibuprofen (ADVIL,MOTRIN) 800 MG tablet Take 1 tablet (800 mg total) by mouth every 8 (eight) hours as needed.  . loratadine (CLARITIN) 10 MG tablet Take 10 mg by mouth daily as needed for allergies.   . pantoprazole (PROTONIX) 40 MG tablet TAKE 1 TABLET (40 MG TOTAL) BY MOUTH DAILY.  . simvastatin (ZOCOR) 40 MG tablet TAKE 1 TABLET (40 MG TOTAL) BY MOUTH DAILY AT 6 PM.  . Tamsulosin HCl (FLOMAX) 0.4 MG CAPS Take 0.4 mg by mouth daily.   . valsartan-hydrochlorothiazide (DIOVAN-HCT) 80-12.5 MG tablet TAKE 1 TABLET BY MOUTH EVERY DAY   No facility-administered encounter medications on file as of 11/12/2018.         Objective:    General: Well Developed, well nourished, and in no acute distress.  Neuro: Alert and oriented x3, extra-ocular muscles intact, sensation grossly intact.  HEENT: Normocephalic, atraumatic  Skin: Warm and dry, no rashes. Cardiac: Regular rate and rhythm, no murmurs rubs or gallops, no lower extremity edema.  Respiratory: Clear to auscultation bilaterally. Not using accessory muscles, speaking in full sentences.   Impression and Recommendations:    DM -we will bring his hemoglobin A1c down to 6.0 without medication which is absolutely phenomenal.  Organ to  continue to work on diet and exercise and weight loss and then follow-up again in 3 to 4 months.  HTN -blood pressures well controlled.  Orbit obesity/BMI 43-he  is really done fantastic.  He is lost about 25 pounds.  Encouraged him to just continue with the change that he is made.  Also encouraged him to work on maybe getting back into the gym I think this will be helpful as well.

## 2018-12-03 DIAGNOSIS — I878 Other specified disorders of veins: Secondary | ICD-10-CM | POA: Diagnosis not present

## 2018-12-03 DIAGNOSIS — N3943 Post-void dribbling: Secondary | ICD-10-CM | POA: Diagnosis not present

## 2018-12-03 DIAGNOSIS — K5900 Constipation, unspecified: Secondary | ICD-10-CM | POA: Diagnosis not present

## 2018-12-03 DIAGNOSIS — N200 Calculus of kidney: Secondary | ICD-10-CM | POA: Diagnosis not present

## 2018-12-03 DIAGNOSIS — N401 Enlarged prostate with lower urinary tract symptoms: Secondary | ICD-10-CM | POA: Diagnosis not present

## 2018-12-03 DIAGNOSIS — M47899 Other spondylosis, site unspecified: Secondary | ICD-10-CM | POA: Diagnosis not present

## 2018-12-03 LAB — PSA: PSA: 0.4

## 2018-12-09 ENCOUNTER — Other Ambulatory Visit: Payer: Self-pay | Admitting: Family Medicine

## 2018-12-18 DIAGNOSIS — H6122 Impacted cerumen, left ear: Secondary | ICD-10-CM | POA: Diagnosis not present

## 2019-01-20 ENCOUNTER — Other Ambulatory Visit: Payer: Self-pay | Admitting: Family Medicine

## 2019-01-27 ENCOUNTER — Ambulatory Visit (INDEPENDENT_AMBULATORY_CARE_PROVIDER_SITE_OTHER): Payer: Medicare Other | Admitting: Internal Medicine

## 2019-01-27 ENCOUNTER — Encounter: Payer: Self-pay | Admitting: Internal Medicine

## 2019-01-27 VITALS — BP 138/78 | HR 53 | Ht 70.0 in | Wt 311.2 lb

## 2019-01-27 DIAGNOSIS — I10 Essential (primary) hypertension: Secondary | ICD-10-CM | POA: Diagnosis not present

## 2019-01-27 DIAGNOSIS — R002 Palpitations: Secondary | ICD-10-CM | POA: Diagnosis not present

## 2019-01-27 DIAGNOSIS — I493 Ventricular premature depolarization: Secondary | ICD-10-CM

## 2019-01-27 NOTE — Progress Notes (Signed)
Patient Care Team: Hali Marry, MD as PCP - General (Family Medicine) Sonnie Alamo, MD as Referring Physician (Gastroenterology) Bethann Punches, MD as Referring Physician (Urology) Deboraha Sprang, MD as Consulting Physician (Cardiology) Magnus Sinning, MD as Consulting Physician   HPI  Tracy Bennett is a 73 y.o. male Seen in follow-up for PVCs of review from the right ventricular outflow tract. He had no associated symptoms.  There also been a question at the initial consultation regarding long QT syndrome for which I found no evidence.   No symptomatic palpitations  Working on weight loss and was able to lose 30+ pounds.  This does not include an 8 pound lipoma resection.  He has peripheral edema.  Shortness of breath is considerably better with the weight loss.  No chest pain.  Records and Results Reviewed   DATE TEST EF   11/16 Echo    60 %   11/17 Echo   60-65 %           Date Cr K LDL  9/16   75  12/18 1.24 4.1            Past Medical History:  Diagnosis Date  . Community acquired pneumonia of left lower lobe of lung ([place]) 05/15/2018  . Dyspnea    with exertion  . Dysrhythmia    frequent PVC's  . Frequent urination   . GERD (gastroesophageal reflux disease)   . History of kidney stones   . Hyperlipidemia   . Hypertension   . Kidney stones    x 4   . Sleep apnea    BiPAP    Past Surgical History:  Procedure Laterality Date  . CARPAL TUNNEL RELEASE     Right   . CERVICAL FUSION     cervical fusion times 2  . FOOT TENDON SURGERY  11/14/2011   Dr. Sharol Given  . left knee surgery     ACL repair   . LIPOMA EXCISION Bilateral    on back  . MASS EXCISION N/A 01/24/2018   Procedure: EXCISION OF BACK MASS;  Surgeon: Erroll Luna, MD;  Location: [facility];  Service: General;  Laterality: N/A;  . UMBILICAL HERNIA REPAIR      Current Outpatient Medications  Medication Sig Dispense Refill  . ACCU-CHEK FASTCLIX LANCETS MISC For testing  blood sugars daily. Dx: E11.9 102 each 11  . blood glucose meter kit and supplies KIT Dispense based on patient and insurance preference. Use up to once a day as directed. (FOR ICD-9 250.00, 250.01). 1 each 0  . fesoterodine (TOVIAZ) 8 MG TB24 tablet Take 8 mg by mouth daily.    . finasteride (PROSCAR) 5 MG tablet Take 1 tablet (5 mg total) by mouth daily. 30 tablet 1  . fluocinonide (LIDEX) 0.05 % external solution Apply 1 application topically 2 (two) times daily as needed (DRY SKIN).    . furosemide (LASIX) 20 MG tablet Take 1 tablet (20 mg total) by mouth every other day. 15 tablet 11  . glucose blood (ACCU-CHEK GUIDE) test strip Check fasting blood sugar every morning. Dx: E11.9 100 each 11  . ibuprofen (ADVIL,MOTRIN) 800 MG tablet Take 1 tablet (800 mg total) by mouth every 8 (eight) hours as needed. 30 tablet 0  . loratadine (CLARITIN) 10 MG tablet Take 10 mg by mouth daily as needed for allergies.     . pantoprazole (PROTONIX) 40 MG tablet TAKE 1 TABLET (40 MG TOTAL) BY MOUTH DAILY.  90 tablet 2  . simvastatin (ZOCOR) 40 MG tablet TAKE 1 TABLET (40 MG TOTAL) BY MOUTH DAILY AT 6 PM. 90 tablet 1  . Tamsulosin HCl (FLOMAX) 0.4 MG CAPS Take 0.4 mg by mouth daily.     . valsartan-hydrochlorothiazide (DIOVAN-HCT) 80-12.5 MG tablet TAKE 1 TABLET BY MOUTH EVERY DAY 90 tablet 0   No current facility-administered medications for this visit.     Allergies  Allergen Reactions  . Amoxicillin Swelling    Tongue sores  . Iodinated Diagnostic Agents Shortness Of Breath and Rash    Patient gets hot and sweating  . Lisinopril Cough and Rash    Other reaction(s): Cough      Review of Systems negative except from HPI and PMH  Physical Exam BP 138/78   Pulse (!) 53   Ht _0  (1.778 m)   Wt (!) 311 lb 3.2 oz (141.2 kg)   SpO2 95%   BMI 44.65 kg/m  Well developed and well nourished in no acute distress HENT normal E scleral and icterus clear Neck Supple JVP flat; carotids brisk and  full Clear to ausculation  Regular rate and rhythm, no murmurs gallops or rub Soft with active bowel sounds No clubbing cyanosis 2+ Edema Alert and oriented, grossly normal motor and sensory function Skin Warm and Dry  ECG Sinus @ 53 14/08/30   Assessment and  Plan  PVCs-RVOT  Hypertension and hypertensive heart disease  HFpEF  Sinus Bradycardia   Morbidly obese  Blood pressure is well controlled at home in the 130 range  He is volume overloaded.  We will increase his diuretic to every other day x3 doses for the next week.  Discussed the importance of salt restriction.  His salt intake is exuberant.  No interval palpitations.  He is working on weight loss.  Have encouraged him to continue on calorie restriction.  He is exercising frequently with the use of his arms i.e. chopping wood etc. and working in the yard  We spent more than 50% of our >25 min visit in face to face counseling regarding the above    Current medicines are reviewed at length with the patient today .  The patient does not  have concerns regarding medicines.

## 2019-01-27 NOTE — Patient Instructions (Signed)
Medication Instructions:  Your physician has recommended you make the following change in your medication:   Begin taking your lasix 3 times per week.  Labwork: None ordered.  Testing/Procedures: None ordered.  Follow-Up: Your physician recommends that you schedule a follow-up appointment in:   One Year with Dr Caryl Comes  Any Other Special Instructions Will Be Listed Below (If Applicable).     If you need a refill on your cardiac medications before your next appointment, please call your pharmacy.

## 2019-02-04 ENCOUNTER — Encounter: Payer: Self-pay | Admitting: Physician Assistant

## 2019-02-04 ENCOUNTER — Ambulatory Visit (INDEPENDENT_AMBULATORY_CARE_PROVIDER_SITE_OTHER): Payer: Medicare Other | Admitting: Physician Assistant

## 2019-02-04 VITALS — BP 139/64 | HR 67 | Temp 97.9°F | Wt 310.0 lb

## 2019-02-04 DIAGNOSIS — R059 Cough, unspecified: Secondary | ICD-10-CM

## 2019-02-04 DIAGNOSIS — R05 Cough: Secondary | ICD-10-CM

## 2019-02-04 DIAGNOSIS — J209 Acute bronchitis, unspecified: Secondary | ICD-10-CM

## 2019-02-04 MED ORDER — BENZONATATE 200 MG PO CAPS: 200.0000 mg | ORAL_CAPSULE | Freq: Two times a day (BID) | ORAL | 0 refills | Status: DC | PRN

## 2019-02-04 MED ORDER — PREDNISONE 50 MG PO TABS: ORAL_TABLET | ORAL | 0 refills | Status: DC

## 2019-02-04 MED ORDER — HYDROCOD POLST-CPM POLST ER 10-8 MG/5ML PO SUER: 5.0000 mL | Freq: Two times a day (BID) | ORAL | 0 refills | Status: DC | PRN

## 2019-02-04 NOTE — Patient Instructions (Signed)
Acute Bronchitis, Adult Acute bronchitis is when air tubes (bronchi) in the lungs suddenly get swollen. The condition can make it hard to breathe. It can also cause these symptoms:  A cough.  Coughing up clear, yellow, or green mucus.  Wheezing.  Chest congestion.  Shortness of breath.  A fever.  Body aches.  Chills.  A sore throat. Follow these instructions at home:  Medicines  Take over-the-counter and prescription medicines only as told by your doctor.  If you were prescribed an antibiotic medicine, take it as told by your doctor. Do not stop taking the antibiotic even if you start to feel better. General instructions  Rest.  Drink enough fluids to keep your pee (urine) pale yellow.  Avoid smoking and secondhand smoke. If you smoke and you need help quitting, ask your doctor. Quitting will help your lungs heal faster.  Use an inhaler, cool mist vaporizer, or humidifier as told by your doctor.  Keep all follow-up visits as told by your doctor. This is important. How is this prevented? To lower your risk of getting this condition again:  Wash your hands often with soap and water. If you cannot use soap and water, use hand sanitizer.  Avoid contact with people who have cold symptoms.  Try not to touch your hands to your mouth, nose, or eyes.  Make sure to get the flu shot every year. Contact a doctor if:  Your symptoms do not get better in 2 weeks. Get help right away if:  You cough up blood.  You have chest pain.  You have very bad shortness of breath.  You become dehydrated.  You faint (pass out) or keep feeling like you are going to pass out.  You keep throwing up (vomiting).  You have a very bad headache.  Your fever or chills gets worse. This information is not intended to replace advice given to you by your health care provider. Make sure you discuss any questions you have with your health care provider. Document Released: 05/01/2008 Document  Revised: 06/27/2017 Document Reviewed: 05/03/2016 Elsevier Interactive Patient Education  2019 Elsevier Inc.  

## 2019-02-04 NOTE — Progress Notes (Signed)
Subjective:    Patient ID: Tracy Bennett, male    DOB: 1946-07-21, 73 y.o.   MRN: [id number]  HPI  Pt is a 73 yo obese male with T2DM, OSA, hx of pneumonia who presents to the clinic with productive cough that started yesterday morning. He has taken some tylenol with little relief. He states "mucinex usually does not do anything for me". He had a little ST. Sputum is yellow in color. He has struggled to use his CPAP at night. No wheezing or SOB. No fever, chills, body aches or sinus pressure. Denies any asthma or COPD. He is concerned because he did have pneumonia in the summer.   .. Active Ambulatory Problems    Diagnosis Date Noted  . Hypertension 10/12/2011  . GERD (gastroesophageal reflux disease) 10/12/2011  . Hyperlipidemia 10/12/2011  . Obesity 02/09/2012  . OSA treated with BiPAP 09/01/2014  . IFG (impaired fasting glucose) 09/01/2014  . Mild diastolic dysfunction 54/00/8676  . LVH (left ventricular hypertrophy) due to hypertensive disease 11/23/2014  . Obesity, Class III, BMI 40-49.9 (morbid obesity) ([place]) 08/27/2015  . OAB (overactive bladder) 08/27/2015  . 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 Community acquired pneumonia of left lower lobe of lung ([place]) 05/15/2018  . Aortic atherosclerosis ([place]) 05/20/2018   Resolved Ambulatory Problems    Diagnosis Date Noted  . BPH (benign prostatic hyperplasia) 01/28/2013  . Severe obesity (BMI >= 40) ([place]) 01/29/2014  . Prolonged Q-T interval on ECG 11/04/2014   Past Medical History:  Diagnosis Date  . Dyspnea   . Dysrhythmia   . Frequent urination   . History of kidney stones   . Kidney stones   . Sleep apnea       Review of Systems See HPI.     Objective:   Physical Exam Vitals signs reviewed.  Constitutional:      Appearance: Normal appearance. He is obese.  HENT:     Head: Normocephalic and atraumatic.     Right Ear: Tympanic membrane normal.     Left Ear: Tympanic  membrane normal.     Nose: Nose normal.     Mouth/Throat:     Pharynx: Oropharynx is clear.  Eyes:     Conjunctiva/sclera: Conjunctivae normal.  Neck:     Musculoskeletal: Normal range of motion.  Cardiovascular:     Rate and Rhythm: Normal rate.     Pulses: Normal pulses.     Heart sounds: Normal heart sounds.  Pulmonary:     Effort: Pulmonary effort is normal.     Breath sounds: No wheezing or rhonchi.  Lymphadenopathy:     Cervical: No cervical adenopathy.  Neurological:     General: No focal deficit present.     Mental Status: He is alert and oriented to person, place, and time.  Psychiatric:        Mood and Affect: Mood normal.        Behavior: Behavior normal.           Assessment & Plan:  Marland KitchenMarland KitchenKristi was seen today for cough.  Diagnoses and all orders for this visit:  Acute bronchitis, unspecified organism -     predniSONE (DELTASONE) 50 MG tablet; Take one tablet for 5 days. -     chlorpheniramine-HYDROcodone ([place]) 10-8 MG/5ML SUER; Take 5 mLs by mouth every 12 (twelve) hours as needed. -     benzonatate (TESSALON) 200 MG capsule; Take 1 capsule (200 mg total) by mouth 2 (  two) times daily as needed for cough. -     DG Chest 2 View  Cough -     chlorpheniramine-HYDROcodone (TUSSIONEX) 10-8 MG/5ML SUER; Take 5 mLs by mouth every 12 (twelve) hours as needed. -     benzonatate (TESSALON) 200 MG capsule; Take 1 capsule (200 mg total) by mouth 2 (two) times daily as needed for cough. -     DG Chest 2 View   Symptoms just started 1 day ago. No fever or abnormal lung sounds to suggest pneumonia. CXR ordered. For now treated for bronchitis with prednisone, tussionex, tessalon. Encouraged use of mucinex. HO given. Rest and hydrate. If symptoms worsening or changing call office but get CXR first.   Last time he had cough syrup was June with pneumonia.  Small quanity given.  Marland KitchenMarland KitchenPDMP reviewed during this encounter.

## 2019-02-12 ENCOUNTER — Ambulatory Visit: Payer: Medicare Other | Admitting: Family Medicine

## 2019-02-12 ENCOUNTER — Telehealth: Payer: Self-pay | Admitting: Family Medicine

## 2019-02-12 ENCOUNTER — Telehealth: Payer: Self-pay

## 2019-02-12 ENCOUNTER — Encounter: Payer: Self-pay | Admitting: Family Medicine

## 2019-02-12 ENCOUNTER — Other Ambulatory Visit: Payer: Self-pay

## 2019-02-12 ENCOUNTER — Ambulatory Visit (INDEPENDENT_AMBULATORY_CARE_PROVIDER_SITE_OTHER): Payer: Medicare Other | Admitting: Family Medicine

## 2019-02-12 VITALS — BP 123/53 | HR 75 | Temp 98.3°F | Ht 70.0 in | Wt 309.0 lb

## 2019-02-12 DIAGNOSIS — R05 Cough: Secondary | ICD-10-CM

## 2019-02-12 DIAGNOSIS — I10 Essential (primary) hypertension: Secondary | ICD-10-CM

## 2019-02-12 DIAGNOSIS — E785 Hyperlipidemia, unspecified: Secondary | ICD-10-CM

## 2019-02-12 DIAGNOSIS — Z125 Encounter for screening for malignant neoplasm of prostate: Secondary | ICD-10-CM | POA: Diagnosis not present

## 2019-02-12 DIAGNOSIS — J209 Acute bronchitis, unspecified: Secondary | ICD-10-CM

## 2019-02-12 DIAGNOSIS — R059 Cough, unspecified: Secondary | ICD-10-CM

## 2019-02-12 DIAGNOSIS — E119 Type 2 diabetes mellitus without complications: Secondary | ICD-10-CM

## 2019-02-12 DIAGNOSIS — Z6841 Body Mass Index (BMI) 40.0 and over, adult: Secondary | ICD-10-CM | POA: Diagnosis not present

## 2019-02-12 LAB — POCT GLYCOSYLATED HEMOGLOBIN (HGB A1C): HEMOGLOBIN A1C: 6.6 % — AB (ref 4.0–5.6)

## 2019-02-12 MED ORDER — SIMVASTATIN 40 MG PO TABS: ORAL_TABLET | ORAL | 3 refills | Status: DC

## 2019-02-12 MED ORDER — HYDROCOD POLST-CPM POLST ER 10-8 MG/5ML PO SUER: 5.0000 mL | Freq: Two times a day (BID) | ORAL | 0 refills | Status: DC | PRN

## 2019-02-12 MED ORDER — PREDNISONE 50 MG PO TABS: ORAL_TABLET | ORAL | 0 refills | Status: DC

## 2019-02-12 NOTE — Progress Notes (Signed)
Subjective:    CC: DM  HPI:  Diabetes - no hypoglycemic events. No wounds or sores that are not healing well. No increased thirst or urination. Checking glucose at home. Taking medications as prescribed without any side effects.  Hypertension- Pt denies chest pain, SOB, dizziness, or heart palpitations.  Taking meds as directed w/o problems.  Denies medication side effects.    Is also seen 8 days ago for bronchitis by 1 of my partners.  He said he actually started to feel much better until yesterday when his cough increased again.  Though he denies any new onset fever or shortness of breath.  But the cough is productive.  He has been using his Best boy.  Past medical history, Surgical history, Family history not pertinant except as noted below, Social history, Allergies, and medications have been entered into the medical record, reviewed, and corrections made.   Review of Systems: No fevers, chills, night sweats, weight loss, chest pain, or shortness of breath.   Objective:    General: Well Developed, well nourished, and in no acute distress.  Neuro: Alert and oriented x3, extra-ocular muscles intact, sensation grossly intact.  HEENT: Normocephalic, atraumatic  Skin: Warm and dry, no rashes. Cardiac: Regular rate and rhythm, no murmurs rubs or gallops, no lower extremity edema.  Respiratory: Clear to auscultation bilaterally. Not using accessory muscles, speaking in full sentences.   Impression and Recommendations:    DM - up from previous but has been sick and on prednisone. He is o/w doing well. He really wants to hold off on metformin. He has lost 30 lbs. questions whether or not his A1c is accurate.  So we will get one on the blood draw.  Also due for lipid panel CMP.  HTN  - Well controlled. Continue current regimen. Follow up in  6 motnhs.   Acute bronchitis - likely viral.  He was much better while on the prednisone and now 2 days off he started to cough again.  I refill  the prednisone.  His lungs are completely clear on exam which is reassuring and he denies any shortness of breath or chest tightness.  Also refill the cough syrup as his cough seems to be worse at night and first thing in the morning.  If not improving by Friday encouraged him to call us back and we will consider treatment with an antibiotic at that point such as azithromycin.  Morbid obesity/BMI 44-he is actually made great progress and since becoming diagnosed with diabetes has actually lost 30 pounds.  He did see his cardiologist recently who also encouraged him to start taking his diuretic about 3 times a week because he felt like he was getting a little volume overloaded as well.  He says when he initially restarted it he actually dropped a most 8 pounds.  We did discuss some strategies around weight loss and continuing to work on portion control.  Due for screening PSA.

## 2019-02-12 NOTE — Telephone Encounter (Signed)
It actually is covered by his insurance. No worries. Dr. Madilyn Fireman advised.Marland KitchenMarland KitchenMarland KitchenElouise Munroe, [place]

## 2019-02-12 NOTE — Telephone Encounter (Signed)
PT declined PSA Bloodwork due to, "test not being covered by insurance."  Please Advise.

## 2019-02-12 NOTE — Telephone Encounter (Signed)
Tafari didn't have the PSA test done because he has already had it done with [facility]. I abstracted the PSA from [facility].

## 2019-02-13 LAB — COMPLETE METABOLIC PANEL WITH GFR
AG Ratio: 1.8 (calc) (ref 1.0–2.5)
ALT: 19 U/L (ref 9–46)
AST: 14 U/L (ref 10–35)
Albumin: 4.2 g/dL (ref 3.6–5.1)
Alkaline phosphatase (APISO): 52 U/L (ref 35–144)
BUN/Creatinine Ratio: 13 (calc) (ref 6–22)
BUN: 18 mg/dL (ref 7–25)
CO2: 30 mmol/L (ref 20–32)
Calcium: 9.4 mg/dL (ref 8.6–10.3)
Chloride: 99 mmol/L (ref 98–110)
Creat: 1.35 mg/dL — ABNORMAL HIGH (ref 0.70–1.18)
GFR, Est African American: 60 mL/min/{1.73_m2} (ref >=60)
GFR, Est Non African American: 52 mL/min/{1.73_m2} — ABNORMAL LOW (ref >=60)
Globulin: 2.4 g/dL (calc) (ref 1.9–3.7)
Glucose, Bld: 118 mg/dL — ABNORMAL HIGH (ref 65–99)
Potassium: 4 mmol/L (ref 3.5–5.3)
Sodium: 137 mmol/L (ref 135–146)
Total Bilirubin: 1 mg/dL (ref 0.2–1.2)
Total Protein: 6.6 g/dL (ref 6.1–8.1)

## 2019-02-13 LAB — CBC WITH DIFFERENTIAL/PLATELET
Absolute Monocytes: 876 cells/uL (ref 200–950)
Basophils Absolute: 96 cells/uL (ref 0–200)
Basophils Relative: 0.8 %
Eosinophils Absolute: 180 cells/uL (ref 15–500)
Eosinophils Relative: 1.5 %
HEMATOCRIT: 45.2 % (ref 38.5–50.0)
Hemoglobin: 15.2 g/dL (ref 13.2–17.1)
Lymphs Abs: 1692 cells/uL (ref 850–3900)
MCH: 28.6 pg (ref 27.0–33.0)
MCHC: 33.6 g/dL (ref 32.0–36.0)
MCV: 85.1 fL (ref 80.0–100.0)
MPV: 12.5 fL (ref 7.5–12.5)
Monocytes Relative: 7.3 %
Neutro Abs: 9156 cells/uL — ABNORMAL HIGH (ref 1500–7800)
Neutrophils Relative %: 76.3 %
PLATELETS: 179 10*3/uL (ref 140–400)
RBC: 5.31 10*6/uL (ref 4.20–5.80)
RDW: 13.2 % (ref 11.0–15.0)
Total Lymphocyte: 14.1 %
WBC: 12 10*3/uL — ABNORMAL HIGH (ref 3.8–10.8)

## 2019-02-13 LAB — HEMOGLOBIN A1C
Hgb A1c MFr Bld: 6.5 % of total Hgb — ABNORMAL HIGH (ref <5.7)
Mean Plasma Glucose: 140 (calc)
eAG (mmol/L): 7.7 (calc)

## 2019-02-13 LAB — LIPID PANEL
Cholesterol: 114 mg/dL (ref <200)
HDL: 41 mg/dL (ref >=40)
LDL Cholesterol (Calc): 52 mg/dL (calc)
Non-HDL Cholesterol (Calc): 73 mg/dL (calc) (ref <130)
Total CHOL/HDL Ratio: 2.8 (calc) (ref <5.0)
Triglycerides: 130 mg/dL (ref <150)

## 2019-02-18 ENCOUNTER — Telehealth: Payer: Self-pay

## 2019-02-18 DIAGNOSIS — R059 Cough, unspecified: Secondary | ICD-10-CM

## 2019-02-18 DIAGNOSIS — J209 Acute bronchitis, unspecified: Secondary | ICD-10-CM

## 2019-02-18 DIAGNOSIS — R05 Cough: Secondary | ICD-10-CM

## 2019-02-18 MED ORDER — AZITHROMYCIN 250 MG PO TABS: ORAL_TABLET | ORAL | 0 refills | Status: AC

## 2019-02-18 MED ORDER — HYDROCOD POLST-CPM POLST ER 10-8 MG/5ML PO SUER: 5.0000 mL | Freq: Two times a day (BID) | ORAL | 0 refills | Status: DC | PRN

## 2019-02-18 NOTE — Telephone Encounter (Signed)
Tracy Bennett called and states he still has the cough. No fever. He states he was advised to call back for a Zpak if symptoms persisted.

## 2019-02-18 NOTE — Telephone Encounter (Signed)
rx sent

## 2019-02-18 NOTE — Telephone Encounter (Signed)
Patient states he is out of the cough syrup and wanted to know if he could get a refill.

## 2019-02-18 NOTE — Telephone Encounter (Signed)
Ok, rx sent

## 2019-02-19 NOTE — Telephone Encounter (Signed)
Pt's wife advised.Maryruth Eve, Lahoma Crocker, CMA

## 2019-04-08 ENCOUNTER — Encounter: Payer: Self-pay | Admitting: Family Medicine

## 2019-04-08 ENCOUNTER — Ambulatory Visit (INDEPENDENT_AMBULATORY_CARE_PROVIDER_SITE_OTHER): Payer: Medicare Other | Admitting: Family Medicine

## 2019-04-08 VITALS — BP 132/82 | Ht 70.0 in

## 2019-04-08 DIAGNOSIS — M5442 Lumbago with sciatica, left side: Secondary | ICD-10-CM | POA: Diagnosis not present

## 2019-04-08 MED ORDER — METHOCARBAMOL 500 MG PO TABS: 500.0000 mg | ORAL_TABLET | Freq: Three times a day (TID) | ORAL | 0 refills | Status: DC | PRN

## 2019-04-08 MED ORDER — PREDNISONE 20 MG PO TABS: 40.0000 mg | ORAL_TABLET | Freq: Every day | ORAL | 0 refills | Status: DC

## 2019-04-08 MED ORDER — TRAMADOL HCL 50 MG PO TABS: 50.0000 mg | ORAL_TABLET | Freq: Four times a day (QID) | ORAL | 0 refills | Status: AC | PRN

## 2019-04-08 NOTE — Progress Notes (Signed)
Virtual Visit via Telephone Note  I connected with Tracy Bennett on 04/08/19 at  3:20 PM EDT by telephone and verified that I am speaking with the correct person using two identifiers.   I discussed the limitations, risks, security and privacy concerns of performing an evaluation and management service by telephone and the availability of in person appointments. I also discussed with the patient that there may be a patient responsible charge related to this service. The patient expressed understanding and agreed to proceed.  Pt was at home and I was in my office for the virtual visit.      Subjective:    CC: Injured his back.    HPI: Pt was Bennett yard work and over did it.  Has been spreading mulch and digging.  Says came in and sat on the cough and when got up felt a sharp pain and had to stand up slowing. Was feeling a pulling sensation.  Pain on LL near buttock. Worse yesterday like a pinched nerve. Taking aleve, tylenol max strength. Ice, moist heat heat helps more.  Rates pain as 6/10 sharp grabbing pain. Last night was bad and it kept "grabbin" when he would move in bed. Tried one of his wife's muscle relaxer but didn't really help.     Past medical history, Surgical history, Family history not pertinant except as noted below, Social history, Allergies, and medications have been entered into the medical record, reviewed, and corrections made.   Review of Systems: No fevers, chills, night sweats, weight loss, chest pain, or shortness of breath.   Objective:    General: Speaking clearly in complete sentences without any shortness of breath.  Alert and oriented x3.  Normal judgment. No apparent acute distress.    Impression and Recommendations:   Low Back pain - likely musculoskeletal and likely discogenic issue.   Recommend trial of prednisone and muscle relaxer.  Ok to use Tyelnol on top of this until the prednisone. When done OK to use Aleve or Advil. Continue moist heat as well.  Try to lay on stomach across his bed.  Will get xray is not better. Will send over small quantity of tramadol. Stop if any side effects.         I discussed the assessment and treatment plan with the patient. The patient was provided an opportunity to ask questions and all were answered. The patient agreed with the plan and demonstrated an understanding of the instructions.   The patient was advised to call back or seek an in-person evaluation if the symptoms worsen or if the condition fails to improve as anticipated.  I provided 20 minutes of non-face-to-face time during this encounter.   Beatrice Lecher, MD

## 2019-04-08 NOTE — Progress Notes (Signed)
Pt was doing yard work and over did it. Pain on LL near buttock. Worse yesterday like a pinched nerve. Taking aleve, tylenol max strength. Ice, moist heat heat helps more 6/10 sharp grabbing pain. He stated that when he sit in a straight back chair this alleviates his pain, however, when he gets up to walk or sits down on a couch this is painful. He reports taking 1 of his wife's flexeril 10 mg last night this did not help .Elouise Munroe, [place]

## 2019-05-05 DIAGNOSIS — Z86008 Personal history of in-situ neoplasm of other site: Secondary | ICD-10-CM | POA: Diagnosis not present

## 2019-05-05 DIAGNOSIS — L820 Inflamed seborrheic keratosis: Secondary | ICD-10-CM | POA: Diagnosis not present

## 2019-05-05 DIAGNOSIS — L570 Actinic keratosis: Secondary | ICD-10-CM | POA: Diagnosis not present

## 2019-05-05 DIAGNOSIS — L218 Other seborrheic dermatitis: Secondary | ICD-10-CM | POA: Diagnosis not present

## 2019-05-22 ENCOUNTER — Encounter: Payer: Self-pay | Admitting: Family Medicine

## 2019-05-22 ENCOUNTER — Telehealth: Payer: Medicare Other | Admitting: Family Medicine

## 2019-05-22 ENCOUNTER — Telehealth (INDEPENDENT_AMBULATORY_CARE_PROVIDER_SITE_OTHER): Payer: Medicare Other | Admitting: Family Medicine

## 2019-05-22 VITALS — BP 142/75 | HR 56 | Temp 96.2°F | Ht 70.0 in | Wt 306.0 lb

## 2019-05-22 DIAGNOSIS — E118 Type 2 diabetes mellitus with unspecified complications: Secondary | ICD-10-CM

## 2019-05-22 DIAGNOSIS — I700 Atherosclerosis of aorta: Secondary | ICD-10-CM | POA: Diagnosis not present

## 2019-05-22 DIAGNOSIS — E119 Type 2 diabetes mellitus without complications: Secondary | ICD-10-CM

## 2019-05-22 DIAGNOSIS — I10 Essential (primary) hypertension: Secondary | ICD-10-CM | POA: Diagnosis not present

## 2019-05-22 NOTE — Progress Notes (Signed)
Virtual Visit via Telephone Note  I connected with Tracy Bennett on 05/22/19 at  8:30 AM EDT by telephone and verified that I am speaking with the correct person using two identifiers.   I discussed the limitations, risks, security and privacy concerns of performing an evaluation and management service by telephone and the availability of in person appointments. I also discussed with the patient that there may be a patient responsible charge related to this service. The patient expressed understanding and agreed to proceed.  Pt was at home and I was in my office for the virtual visit.      Subjective:    CC: f/U DM  HPI: Diabetes - no hypoglycemic events. No wounds or sores that are not healing well. No increased thirst or urination. Checking glucose at home. Taking medications as prescribed without any side effects. He has lost 3 more pounds.  He has really cut back on sugar.   Hypertension- Pt denies chest pain, SOB, dizziness, or heart palpitations.  Taking meds as directed w/o problems.  Denies medication side effects.  Dr.Klein put him furosemide 3 x a week.    Follow-up aortic atherosclerosis-no recent chest pain or shortness of breath.  Follow-up CKD 3- no recent changes.   Diabetes - no hypoglycemic events. No wounds or sores that are not healing well. No increased thirst or urination. Checking glucose at home. Taking medications as prescribed without any side effects.    Past medical history, Surgical history, Family history not pertinant except as noted below, Social history, Allergies, and medications have been entered into the medical record, reviewed, and corrections made.   Review of Systems: No fevers, chills, night sweats, weight loss, chest pain, or shortness of breath.   Objective:    General: Speaking clearly in complete sentences without any shortness of breath.  Alert and oriented x3.  Normal judgment. No apparent acute distress.    Impression and  Recommendations:   DM - 90 days fasting is great.  Due for next A1C.  Continue current regimen.    HTN - furosemide 3 days per week.continue current regimne. A little high today.will monitor.    Aortic atherosclerosis-no recent chest pain or shortness of breath.  CKD 3-following renal function every 6 months.  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  Severe obesity/BMI 43 - he is down 3 more lbs. Great work. Continue to work on weight loss. He says he actually got to less than 300 but then gained a few pounds back.      I discussed the assessment and treatment plan with the patient. The patient was provided an opportunity to ask questions and all were answered. The patient agreed with the plan and demonstrated an understanding of the instructions.   The patient was advised to call back or seek an in-person evaluation if the symptoms worsen or if the condition fails to improve as anticipated.  I provided 30 minutes of non-face-to-face time during this encounter.   Beatrice Lecher, MD

## 2019-05-22 NOTE — Progress Notes (Signed)
BS: 125 90 day avg=124 30=129  .Tracy Bennett, [place]

## 2019-06-19 DIAGNOSIS — H6123 Impacted cerumen, bilateral: Secondary | ICD-10-CM | POA: Diagnosis not present

## 2019-06-22 ENCOUNTER — Other Ambulatory Visit: Payer: Self-pay | Admitting: Family Medicine

## 2019-07-07 DIAGNOSIS — M25521 Pain in right elbow: Secondary | ICD-10-CM | POA: Diagnosis not present

## 2019-08-05 NOTE — Progress Notes (Signed)
Subjective:   Tracy Bennett is a 73 y.o. male who presents for Medicare Annual/Subsequent preventive examination.  Review of Systems:  No ROS.  Medicare Wellness Virtual Visit.  Visual/audio telehealth visit, UTA vital signs.   See social history for additional risk factors.    Cardiac Risk Factors include: advanced age (>3mn, >>20women);diabetes mellitus;dyslipidemia;hypertension;male gender  Sleep patterns: Getting 6-7 hours of sleep a night. Wear his CPAP machine a t night.Wakes up 1 time a night to void. Wakes up feeling refreshed Home Safety/Smoke Alarms: Feels safe in home. Smoke alarms in place.  Living environment; Lives with wife in 1 story home with full basement. Steps do have handrails on them. Shower is a walk in shower with grab bars in place and bench as well. Seat Belt Safety/Bike Helmet: Wears seat belt.    Male:   CCS-  UTD   PSA- UTD Lab Results  Component Value Date   PSA 0.4 12/03/2018   PSA 0.66 01/29/2014   PSA 3.99 11/02/2011        Objective:    Vitals: There were no vitals taken for this visit.  There is no height or weight on file to calculate BMI.  Advanced Directives 08/11/2019 08/05/2018 01/21/2018 12/15/2015  Does Patient Have a Medical Advance Directive? Yes Yes Yes No  Type of Advance Directive Living will;Healthcare Power of Attorney Living will HCorryLiving will -  Does patient want to make changes to medical advance directive? No - Patient declined Yes (MAU/Ambulatory/Procedural Areas - Information given) - -  Copy of [place] Chart? No - copy requested - No - copy requested -  Would patient like information on creating a medical advance directive? - - - No - patient declined information    Tobacco Social History   Tobacco Use  Smoking Status Former Smoker  . Quit date: 02/09/1980  . Years since quitting: 39.5  Smokeless Tobacco Former UEngineer, structuralgiven: Not  Answered   Clinical Intake:  Pre-visit preparation completed: Yes  Pain : No/denies pain     Nutritional Risks: None Diabetes: Yes CBG done?: No(Fasting this morning 129 at home) Did pt. bring in CBG monitor from home?: No  How often do you need to have someone help you when you read instructions, pamphlets, or other written materials from your doctor or pharmacy?: 1 - Never What is the last grade level you completed in school?: 15  Interpreter Needed?: No  Information entered by :: KOrlie Dakin LPN  Past Medical History:  Diagnosis Date  . Community acquired pneumonia of left lower lobe of lung ([facility] 05/15/2018  . Dyspnea    with exertion  . Dysrhythmia    frequent PVC's  . Frequent urination   . GERD (gastroesophageal reflux disease)   . History of kidney stones   . Hyperlipidemia   . Hypertension   . Kidney stones    x 4   . Sleep apnea    BiPAP   Past Surgical History:  Procedure Laterality Date  . CARPAL TUNNEL RELEASE     Right   . CERVICAL FUSION     cervical fusion times 2  . FOOT TENDON SURGERY  11/14/2011   Dr. DSharol Given . left knee surgery     ACL repair   . LIPOMA EXCISION Bilateral    on back  . MASS EXCISION N/A 01/24/2018   Procedure: EXCISION OF BACK MASS;  Surgeon: CErroll Luna MD;  Location: [facility] OR;  Service: General;  Laterality: N/A;  . UMBILICAL HERNIA REPAIR     Family History  Problem Relation Age of Onset  . COPD Mother   . Hypertension Mother   . Emphysema Mother    Social History   Socioeconomic History  . Marital status: Married    Spouse name: Rose   . Number of children: 1  . Years of education: college  . Highest education level: Some college, no degree  Occupational History  . Occupation: Retired     Comment: Systems developer  . Financial resource strain: Not hard at all  . Food insecurity    Worry: Never true    Inability: Never true  . Transportation needs    Medical: No    Non-medical: No  Tobacco  Use  . Smoking status: Former Smoker    Quit date: 02/09/1980    Years since quitting: 39.5  . Smokeless tobacco: Former Network engineer and Sexual Activity  . Alcohol use: Yes    Comment: occasionally  . Drug use: No  . Sexual activity: Yes  Lifestyle  . Physical activity    Days per week: 0 days    Minutes per session: 0 min  . Stress: Not at all  Relationships  . Social connections    Talks on phone: More than three times a week    Gets together: Once a week    Attends religious service: More than 4 times per year    Active member of club or organization: Yes    Attends meetings of clubs or organizations: More than 4 times per year    Relationship status: Married  Other Topics Concern  . Not on file  Social History Narrative   Golf about 2 times per week.  No regular exercise.  . Member of [employer]    Outpatient Encounter Medications as of 08/11/2019  Medication Sig  . ACCU-CHEK FASTCLIX LANCETS MISC For testing blood sugars daily. Dx: E11.9  . blood glucose meter kit and supplies KIT Dispense based on patient and insurance preference. Use up to once a day as directed. (FOR ICD-9 250.00, 250.01).  . finasteride (PROSCAR) 5 MG tablet Take 1 tablet (5 mg total) by mouth daily.  . fluocinonide (LIDEX) 0.05 % external solution Apply 1 application topically 2 (two) times daily as needed (DRY SKIN).  . furosemide (LASIX) 20 MG tablet Take 1 tablet (20 mg total) by mouth every other day.  Marland Kitchen glucose blood (ACCU-CHEK GUIDE) test strip Check fasting blood sugar every morning. Dx: E11.9  . loratadine (CLARITIN) 10 MG tablet Take 10 mg by mouth daily as needed for allergies.   Marland Kitchen oxybutynin (DITROPAN-XL) 10 MG 24 hr tablet TAKE ONE TABLET (10 MG DOSE) BY MOUTH DAILY.  . pantoprazole (PROTONIX) 40 MG tablet TAKE 1 TABLET (40 MG TOTAL) BY MOUTH DAILY.  . simvastatin (ZOCOR) 40 MG tablet TAKE 1 TABLET (40 MG TOTAL) BY MOUTH DAILY AT 6 PM.  . Tamsulosin HCl (FLOMAX) 0.4 MG CAPS  Take 0.4 mg by mouth daily.   . valsartan-hydrochlorothiazide (DIOVAN-HCT) 80-12.5 MG tablet TAKE 1 TABLET BY MOUTH EVERY DAY  . fesoterodine (TOVIAZ) 8 MG TB24 tablet Take 8 mg by mouth daily.   No facility-administered encounter medications on file as of 08/11/2019.     Activities of Daily Living In your present state of health, do you have any difficulty performing the following activities: 08/11/2019  Hearing? N  Vision?  N  Difficulty concentrating or making decisions? N  Walking or climbing stairs? Y  Comment left knee pain  Dressing or bathing? N  Doing errands, shopping? N  Preparing Food and eating ? N  Using the Toilet? N  In the past six months, have you accidently leaked urine? N  Do you have problems with loss of bowel control? N  Managing your Medications? N  Managing your Finances? N  Housekeeping or managing your Housekeeping? N  Some recent data might be hidden    Patient Care Team: Hali Marry, MD as PCP - General (Family Medicine) Sonnie Alamo, MD as Referring Physician (Gastroenterology) Bethann Punches, MD as Referring Physician (Urology) Deboraha Sprang, MD as Consulting Physician (Cardiology) Magnus Sinning, MD as Consulting Physician   Assessment:   This is a routine wellness examination for [place].Physical assessment deferred to PCP.   Exercise Activities and Dietary recommendations Current Exercise Habits: The patient does not participate in regular exercise at present, Exercise limited by: None identified Diet  Eats low sugar and low carbs due to diabetes. Eats vegetables and fruit. Needs to cut back on portion. Breakfast: oatmeal or cereal no sugar or eggs and bacon Lunch: soup and sandwich Dinner: Meat and vegetables Drinks 4 20ounce water bottles a day which is much better than last year.      Goals    . DIET - INCREASE WATER INTAKE     Increase water intake to 5 bottles of water daily.    . Weight (lb) < 200 lb (90.7 kg)      Would like to lose 19 pounds to get under 300lbs.       Fall Risk Fall Risk  08/11/2019 05/22/2019 02/12/2019 08/05/2018 08/02/2018  Falls in the past year? 0 0 0 No No  Number falls in past yr: 0 0 0 - -  Injury with Fall? 0 - 0 - -  Follow up Falls prevention discussed - - - -   Is the patient's home free of loose throw rugs in walkways, pet beds, electrical cords, etc?   yes      Grab bars in the bathroom? yes      Handrails on the stairs?   yes      Adequate lighting?   yes   Depression Screen PHQ 2/9 Scores 08/11/2019 05/22/2019 04/08/2019 02/12/2019  PHQ - 2 Score 1 0 0 0  PHQ- 9 Score - - - -    Cognitive Function MMSE - Mini Mental State Exam 08/05/2018  Orientation to time 5  Orientation to Place 5  Registration 3  Attention/ Calculation 5  Recall 3  Language- name 2 objects 2  Language- repeat 1  Language- follow 3 step command 3  Language- read & follow direction 1  Write a sentence 1  Copy design 1  Total score 30     6CIT Screen 08/11/2019 02/14/2017  What Year? 0 points 0 points  What month? 0 points 0 points  What time? 0 points 0 points  Count back from 20 0 points 0 points  Months in reverse 0 points 0 points  Repeat phrase 0 points 4 points  Total Score 0 4    Immunization History  Administered Date(s) Administered  . Influenza Split 11/27/2012, 12/16/2012  . Influenza, Seasonal, Injecte, Preservative Fre 12/11/2013  . Influenza,inj,Quad PF,6+ Mos 09/01/2014, 08/27/2015, 08/27/2016  . Influenza-Unspecified 09/24/2017  . Pneumococcal Conjugate-13 02/14/2017  . Pneumococcal Polysaccharide-23 02/09/2012  . Tdap 01/28/2013  . Zoster  09/15/2011    Screening Tests Health Maintenance  Topic Date Due  . FOOT EXAM  04/19/1956  . OPHTHALMOLOGY EXAM  04/19/1956  . INFLUENZA VACCINE  06/28/2019  . HEMOGLOBIN A1C  08/15/2019  . TETANUS/TDAP  01/29/2023  . COLONOSCOPY  10/01/2023  . Hepatitis C Screening  Completed  . PNA vac Low Risk Adult  Completed         Plan:      Mr. Kleve , Thank you for taking time to come for your Medicare Wellness Visit. I appreciate your ongoing commitment to your health goals. Please review the following plan we discussed and let me know if I can assist you in the future.  Please schedule your next medicare wellness visit with me in 1 yr.   These are the goals we discussed: Goals    . DIET - INCREASE WATER INTAKE     Increase water intake to 5 bottles of water daily.    . Weight (lb) < 200 lb (90.7 kg)     Would like to lose 19 pounds to get under 300lbs.       This is a list of the screening recommended for you and due dates:  Health Maintenance  Topic Date Due  . Complete foot exam   04/19/1956  . Eye exam for diabetics  04/19/1956  . Flu Shot  06/28/2019  . Hemoglobin A1C  08/15/2019  . Tetanus Vaccine  01/29/2023  . Colon Cancer Screening  10/01/2023  .  Hepatitis C: One time screening is recommended by Center for Disease Control  (CDC) for  adults born from [birth year] through 1965.   Completed  . Pneumonia vaccines  Completed     I have personally reviewed and noted the following in the patient's chart:   . Medical and social history . Use of alcohol, tobacco or illicit drugs  . Current medications and supplements . Functional ability and status . Nutritional status . Physical activity . Advanced directives . List of other physicians . Hospitalizations, surgeries, and ER visits in previous 12 months . Vitals . Screenings to include cognitive, depression, and falls . Referrals and appointments  In addition, I have reviewed and discussed with patient certain preventive protocols, quality metrics, and best practice recommendations. A written personalized care plan for preventive services as well as general preventive health recommendations were provided to patient.     Joanne Chars, LPN  06/21/2034

## 2019-08-11 ENCOUNTER — Ambulatory Visit (INDEPENDENT_AMBULATORY_CARE_PROVIDER_SITE_OTHER): Payer: Medicare Other | Admitting: *Deleted

## 2019-08-11 ENCOUNTER — Other Ambulatory Visit: Payer: Self-pay

## 2019-08-11 VITALS — BP 153/59 | HR 83 | Temp 98.0°F | Ht 70.0 in | Wt 319.0 lb

## 2019-08-11 DIAGNOSIS — Z0000 Encounter for general adult medical examination without abnormal findings: Secondary | ICD-10-CM

## 2019-08-11 NOTE — Patient Instructions (Signed)
Mr. Batdorf , Thank you for taking time to come for your Medicare Wellness Visit. I appreciate your ongoing commitment to your health goals. Please review the following plan we discussed and let me know if I can assist you in the future.  Please schedule your next medicare wellness visit with me in 1 yr.  These are the goals we discussed: Goals    . DIET - INCREASE WATER INTAKE     Increase water intake to 5 bottles of water daily.    . Weight (lb) < 200 lb (90.7 kg)     Would like to lose 19 pounds to get under 300lbs.

## 2019-09-06 ENCOUNTER — Other Ambulatory Visit: Payer: Self-pay | Admitting: Family Medicine

## 2019-09-08 ENCOUNTER — Other Ambulatory Visit: Payer: Self-pay | Admitting: Family Medicine

## 2019-09-15 DIAGNOSIS — H6121 Impacted cerumen, right ear: Secondary | ICD-10-CM | POA: Diagnosis not present

## 2019-09-15 DIAGNOSIS — B369 Superficial mycosis, unspecified: Secondary | ICD-10-CM | POA: Diagnosis not present

## 2019-09-24 DIAGNOSIS — Z23 Encounter for immunization: Secondary | ICD-10-CM | POA: Diagnosis not present

## 2019-10-17 DIAGNOSIS — B369 Superficial mycosis, unspecified: Secondary | ICD-10-CM | POA: Diagnosis not present

## 2019-10-27 DIAGNOSIS — H0102B Squamous blepharitis left eye, upper and lower eyelids: Secondary | ICD-10-CM | POA: Diagnosis not present

## 2019-10-27 DIAGNOSIS — Z961 Presence of intraocular lens: Secondary | ICD-10-CM | POA: Diagnosis not present

## 2019-10-27 DIAGNOSIS — H02836 Dermatochalasis of left eye, unspecified eyelid: Secondary | ICD-10-CM | POA: Diagnosis not present

## 2019-10-27 DIAGNOSIS — H02833 Dermatochalasis of right eye, unspecified eyelid: Secondary | ICD-10-CM | POA: Diagnosis not present

## 2019-10-27 DIAGNOSIS — Z9849 Cataract extraction status, unspecified eye: Secondary | ICD-10-CM | POA: Diagnosis not present

## 2019-10-27 DIAGNOSIS — Z9889 Other specified postprocedural states: Secondary | ICD-10-CM | POA: Diagnosis not present

## 2019-10-27 DIAGNOSIS — H0102A Squamous blepharitis right eye, upper and lower eyelids: Secondary | ICD-10-CM | POA: Diagnosis not present

## 2019-10-27 DIAGNOSIS — H04123 Dry eye syndrome of bilateral lacrimal glands: Secondary | ICD-10-CM | POA: Diagnosis not present

## 2019-11-10 DIAGNOSIS — Z86008 Personal history of in-situ neoplasm of other site: Secondary | ICD-10-CM | POA: Diagnosis not present

## 2019-11-10 DIAGNOSIS — L570 Actinic keratosis: Secondary | ICD-10-CM | POA: Diagnosis not present

## 2020-01-03 ENCOUNTER — Other Ambulatory Visit: Payer: Self-pay | Admitting: Family Medicine

## 2020-01-06 ENCOUNTER — Telehealth (INDEPENDENT_AMBULATORY_CARE_PROVIDER_SITE_OTHER): Payer: Medicare Other | Admitting: Family Medicine

## 2020-01-06 ENCOUNTER — Other Ambulatory Visit: Payer: Self-pay | Admitting: *Deleted

## 2020-01-06 ENCOUNTER — Encounter: Payer: Self-pay | Admitting: Family Medicine

## 2020-01-06 VITALS — BP 162/86 | HR 70 | Temp 98.3°F | Ht 70.0 in | Wt 319.0 lb

## 2020-01-06 DIAGNOSIS — R059 Cough, unspecified: Secondary | ICD-10-CM

## 2020-01-06 DIAGNOSIS — I10 Essential (primary) hypertension: Secondary | ICD-10-CM | POA: Diagnosis not present

## 2020-01-06 DIAGNOSIS — R05 Cough: Secondary | ICD-10-CM | POA: Diagnosis not present

## 2020-01-06 MED ORDER — AZITHROMYCIN 250 MG PO TABS: ORAL_TABLET | ORAL | 0 refills | Status: AC

## 2020-01-06 NOTE — Progress Notes (Signed)
Virtual Visit via Video Note  I connected with Tracy Bennett on 01/06/20 at  1:40 PM EST by a video enabled telemedicine application and verified that I am speaking with the correct person using two identifiers.   I discussed the limitations of evaluation and management by telemedicine and the availability of in person appointments. The patient expressed understanding and agreed to proceed.  Subjective:    CC: Cough for several weeks.    HPI:  Cough x 3-4 weeks. He stated that his dry cough has been rattling and sounds like he is blowing a horn. Denied any f/s/c/n/v/d. He said that he doesn't really feel congested. Worse the last 3 days.  Not SOB. C/O  Tickle in his throat. Today coughed up some yellow sputum. No COVID exposure. Voice is raspy.  He is very worried it is similar to last year in March, when he got bronchitis and it took multiple rounds of steroids and antibiotics to finally get better.  His wife started to feel ill yesterday but she has more sinus sxs.    He took extra strength tylenol (2)/and delsym at 10 AM today.  Using Delsym  Q12 hours. Restarted his Claritin. today.    Past medical history, Surgical history, Family history not pertinant except as noted below, Social history, Allergies, and medications have been entered into the medical record, reviewed, and corrections made.   Review of Systems: No fevers, chills, night sweats, weight loss, chest pain, or shortness of breath.   Objective:    General: Speaking clearly in complete sentences without any shortness of breath.  Alert and oriented x3.  Normal judgment. No apparent acute distress.    Impression and Recommendations:   Cough  -suspect initial viral etiology but this point has persisted for 3 to 4 weeks.  No significant shortness of breath to indicate pneumonia.  No fevers etc.  He really does not have a lot of mucus production which is typically seen with bronchitis.  At this point we will do a trial of  antibiotics and if he is not feeling better than he will let us know and we can work-up further.  I do suspect that some component of this could be allergic and did encourage him to continue with his Claritin which he has just started about 4 days ago and felt like was actually causing him to feel a little bit better today.  Also consider that the cough could be coming from his valsartan especially since he describes a tickle in his throat.  He has had a cough with lisinopril about 8 years ago and has been on valsartan for years and has done well but did discuss with him that this is still a possibility and something for [place] to consider.  Hypertension-blood pressure significantly elevated today.  Encouraged him to keep an eye on that and if it staying elevated to let us know.   Offered COVID testing as well but he declined.     I discussed the assessment and treatment plan with the patient. The patient was provided an opportunity to ask questions and all were answered. The patient agreed with the plan and demonstrated an understanding of the instructions.   The patient was advised to call back or seek an in-person evaluation if the symptoms worsen or if the condition fails to improve as anticipated.   Beatrice Lecher, MD

## 2020-01-06 NOTE — Progress Notes (Signed)
Cough x 3-4 weeks. He stated that his cough has been rattling and sounds like he is blowing a horn. Denied any f/s/c/n/v/d. He said that he doesn't really feel congested.   He took extra strength tylenol (2)/and delsym at 10 AM today.

## 2020-01-08 ENCOUNTER — Telehealth: Payer: Self-pay

## 2020-01-08 ENCOUNTER — Other Ambulatory Visit: Payer: Self-pay

## 2020-01-08 ENCOUNTER — Ambulatory Visit (INDEPENDENT_AMBULATORY_CARE_PROVIDER_SITE_OTHER): Payer: Medicare Other | Admitting: Family Medicine

## 2020-01-08 DIAGNOSIS — R059 Cough, unspecified: Secondary | ICD-10-CM

## 2020-01-08 DIAGNOSIS — R05 Cough: Secondary | ICD-10-CM

## 2020-01-08 NOTE — Telephone Encounter (Signed)
Patient had visit 01/06/20 for cough. Wife's COVID swab came back positive. Patient wanting to come get swabbed also.   Please advise if this is OK?

## 2020-01-08 NOTE — Telephone Encounter (Signed)
Patient and wife advised. They were both scheduled for COVID vaccines around the 24th of this month, advised them they need to wait 30 days after SX resolve. They will contact [facility] to reschedule.

## 2020-01-08 NOTE — Progress Notes (Signed)
Patient presents for drive-thru Covid swab. He has had cough and positive Covid contact (wife). He was instructed on self swab and performed testing with no problems. Specimen sent to [facility] for processing.

## 2020-01-08 NOTE — Telephone Encounter (Signed)
Yes, okay to get swabbed.  He has had symptoms for several weeks so it is possible that he might test negative just because he is had it for so long but I do think it is worth testing just to know.  His wife will need to self quarantine for at least 10 days.

## 2020-01-09 ENCOUNTER — Encounter: Payer: Self-pay | Admitting: Family Medicine

## 2020-01-09 NOTE — Progress Notes (Signed)
Agree with documentation as above.   Paxten Appelt, MD  

## 2020-01-10 LAB — NOVEL CORONAVIRUS, NAA: SARS-CoV-2, NAA: DETECTED — AB

## 2020-01-10 LAB — SPECIMEN STATUS REPORT

## 2020-01-12 ENCOUNTER — Encounter: Payer: Self-pay | Admitting: Family Medicine

## 2020-01-12 ENCOUNTER — Telehealth: Payer: Self-pay

## 2020-01-12 MED ORDER — HYDROCODONE-HOMATROPINE 5-1.5 MG/5ML PO SYRP: 5.0000 mL | ORAL_SOLUTION | Freq: Every evening | ORAL | 0 refills | Status: DC | PRN

## 2020-01-12 MED ORDER — BENZONATATE 200 MG PO CAPS: 200.0000 mg | ORAL_CAPSULE | Freq: Two times a day (BID) | ORAL | 0 refills | Status: DC | PRN

## 2020-01-12 NOTE — Telephone Encounter (Signed)
1-Tracy Bennett states he still has a cough and would like medication for the cough. 2- He also wanted to know if he should have the infusion. 3- He wants to know when she should reschedule his COVID-19 vaccine. Please advise.

## 2020-01-12 NOTE — Telephone Encounter (Signed)
Unfortunately he is outside of the window to get the infusion.  It has to be within the first 10 days of onset of symptoms and he had reported to me that he is actually already been coughing for a couple of weeks so unfortunately he would not qualify for the infusion.  But I did go ahead and send over some cough syrup to take at bedtime.  Also send over some Tessalon Perles for him to take during the day.

## 2020-01-13 NOTE — Telephone Encounter (Signed)
Tonya call you call him.  See previous note in regards to he is not a candidate for the infusion bc he has  Already been sick for 10 days.

## 2020-01-13 NOTE — Telephone Encounter (Signed)
We don't recommend re-testing, nor does the CDC.  Some pt can stay positive for a long time. So as long as they quarantine for a minimum of 10 days and they are feeing better and no fever then ok ot be around others with same precautions, masking, handwashing etc

## 2020-01-13 NOTE — Telephone Encounter (Signed)
Hilary and his wife would like to be tested again in a couple of weeks to make sure they have cleared the COVID-19 virus. Please advise.   He has rescheduled his 2nd COVID-19 vaccine for April.

## 2020-01-14 NOTE — Telephone Encounter (Signed)
Called pt and spoke w/ his wife in regards to his questions. Pt was in the background listening and before hanging up his wife asked if he had any more questions or if he wanted to speak w/me. He did not have questions or want to speak w/me. Maryruth Eve, Lahoma Crocker, CMA

## 2020-01-14 NOTE — Telephone Encounter (Signed)
See Tracy Bennett's message in MyChart messages.

## 2020-02-05 ENCOUNTER — Other Ambulatory Visit: Payer: Self-pay

## 2020-02-05 MED ORDER — HYDROCODONE-HOMATROPINE 5-1.5 MG/5ML PO SYRP: 5.0000 mL | ORAL_SOLUTION | Freq: Every evening | ORAL | 0 refills | Status: DC | PRN

## 2020-02-05 NOTE — Telephone Encounter (Signed)
Okay, I do not mind sending over a refill but he cannot take this medication chronically it is a narcotic and should only be used rarely.  So if he is having a chronic cough from his CPAP we may need to address that with a sleep specialist.

## 2020-02-05 NOTE — Telephone Encounter (Signed)
Patient called requesting refill on Hycodan. States he is feeling better from [place] but his bi-pap really makes him cough a lot while sleeping. He tried some Delsym OTC but has not had any relief.   RX pended. Last written 01/12/20

## 2020-02-12 DIAGNOSIS — N401 Enlarged prostate with lower urinary tract symptoms: Secondary | ICD-10-CM | POA: Diagnosis not present

## 2020-02-12 DIAGNOSIS — N3943 Post-void dribbling: Secondary | ICD-10-CM | POA: Diagnosis not present

## 2020-02-12 DIAGNOSIS — N200 Calculus of kidney: Secondary | ICD-10-CM | POA: Diagnosis not present

## 2020-03-08 DIAGNOSIS — H6063 Unspecified chronic otitis externa, bilateral: Secondary | ICD-10-CM | POA: Diagnosis not present

## 2020-03-18 DIAGNOSIS — Z23 Encounter for immunization: Secondary | ICD-10-CM | POA: Diagnosis not present

## 2020-04-08 DIAGNOSIS — Z23 Encounter for immunization: Secondary | ICD-10-CM | POA: Diagnosis not present

## 2020-04-23 ENCOUNTER — Other Ambulatory Visit: Payer: Self-pay

## 2020-04-23 ENCOUNTER — Emergency Department (INDEPENDENT_AMBULATORY_CARE_PROVIDER_SITE_OTHER)
Admission: EM | Admit: 2020-04-23 | Discharge: 2020-04-23 | Disposition: A | Discharge status: Home / Self Care | Payer: Medicare Other | Source: Home / Self Care

## 2020-04-23 DIAGNOSIS — S90852A Superficial foreign body, left foot, initial encounter: Secondary | ICD-10-CM

## 2020-04-23 MED ORDER — DOXYCYCLINE HYCLATE 100 MG PO CAPS: 100.0000 mg | ORAL_CAPSULE | Freq: Two times a day (BID) | ORAL | 0 refills | Status: DC

## 2020-04-23 NOTE — Discharge Instructions (Signed)
  There was no remaining splinted seen on exam today. You should soak your foot in warm water and epson salt 2-3 times daily for 15-20 minutes at a time. Apply an over the counter antibiotic ointment for up to 5 days and a cushioned bandage to help with pain.  Please take antibiotics as prescribed and be sure to complete entire course even if you start to feel better to ensure infection does not come back.  Call to schedule a follow up appointment with a podiatrist, Dr. Jacqualyn Posey comes to [facility] on Fridays.

## 2020-04-23 NOTE — ED Provider Notes (Signed)
[facility]    CSN: [id number] Arrival date & time: 04/23/20  1114      History   Chief Complaint Chief Complaint  Patient presents with  . Foreign Body in Skin    HPI Tracy Bennett is a 74 y.o. male.   HPI Tracy Bennett is a 74 y.o. male presenting to UC with c/o pain in the bottom of his Left foot after getting a wooden splinter from his deck 4 days ago. He reports his wife was able to remove a large 3-4 inch piece of wood out of his foot the day it happened but the area is still sore. He is concerned there is still a splinter left.  Denies drainage or pus. Denies redness or fever. Last Tetanus 3 years ago after stepping on a metal nail.    Past Medical History:  Diagnosis Date  . Community acquired pneumonia of left lower lobe of lung 05/15/2018  . Dyspnea    with exertion  . Dysrhythmia    frequent PVC's  . Frequent urination   . GERD (gastroesophageal reflux disease)   . History of kidney stones   . Hyperlipidemia   . Hypertension   . Kidney stones    x 4   . Sleep apnea    BiPAP    Patient Active Problem List   Diagnosis Date Noted  . BMI 40.0-44.9, adult ([facility]) 02/12/2019  . Aortic atherosclerosis ([place]) 05/20/2018  . CKD (chronic kidney disease) stage 3, GFR 30-59 ml/min 03/29/2016  . Obesity, Class III, BMI 40-49.9 (morbid obesity) ([facility]) 08/27/2015  . OAB (overactive bladder) 08/27/2015  . Mild diastolic dysfunction 00/93/8182  . LVH (left ventricular hypertrophy) due to hypertensive disease 11/23/2014  . OSA treated with BiPAP 09/01/2014  . Controlled diabetes mellitus type 2 with complications ([place]) 99/37/1696  . Severe obesity (BMI >= 40) ([place]) 01/29/2014  . Obesity 02/09/2012  . Hypertension 10/12/2011  . GERD (gastroesophageal reflux disease) 10/12/2011  . Hyperlipidemia 10/12/2011    Past Surgical History:  Procedure Laterality Date  . CARPAL TUNNEL RELEASE     Right   . CERVICAL FUSION     cervical fusion times 2  . FOOT  TENDON SURGERY  11/14/2011   Dr. Sharol Given  . left knee surgery     ACL repair   . LIPOMA EXCISION Bilateral    on back  . MASS EXCISION N/A 01/24/2018   Procedure: EXCISION OF BACK MASS;  Surgeon: Erroll Luna, MD;  Location: [facility] [place];  Service: General;  Laterality: N/A;  . UMBILICAL HERNIA REPAIR         Home Medications    Prior to Admission medications   Medication Sig Start Date End Date Taking? Authorizing Provider  ACCU-CHEK FASTCLIX LANCETS MISC For testing blood sugars daily. Dx: E11.9 09/09/18   Hali Marry, MD  blood glucose meter kit and supplies KIT Dispense based on patient and insurance preference. Use up to once a day as directed. (FOR ICD-9 250.00, 250.01). 08/13/18   Hali Marry, MD  doxycycline (VIBRAMYCIN) 100 MG capsule Take 1 capsule (100 mg total) by mouth 2 (two) times daily. One po bid x 7 days 04/23/20   Noe Gens, PA-C  finasteride (PROSCAR) 5 MG tablet Take 1 tablet (5 mg total) by mouth daily. 05/13/14   Hali Marry, MD  fluocinonide (LIDEX) 0.05 % external solution Apply 1 application topically 2 (two) times daily as needed (DRY SKIN).    [provider]  glucose  blood (ACCU-CHEK GUIDE) test strip Check fasting blood sugar every morning. Dx: E11.9 09/16/18   Hali Marry, MD  HYDROcodone-homatropine [facility]) 5-1.5 MG/5ML syrup Take 5 mLs by mouth at bedtime as needed for cough. 02/05/20   Hali Marry, MD  loratadine (CLARITIN) 10 MG tablet Take 10 mg by mouth daily as needed for allergies.     [provider]  oxybutynin (DITROPAN-XL) 10 MG 24 hr tablet TAKE ONE TABLET (10 MG DOSE) BY MOUTH DAILY. 03/11/19   [provider]  pantoprazole (PROTONIX) 40 MG tablet Take 1 tablet (40 mg total) by mouth daily. 09/09/19   Hali Marry, MD  simvastatin (ZOCOR) 40 MG tablet TAKE 1 TABLET (40 MG TOTAL) BY MOUTH DAILY AT 6 PM. 02/12/19   Hali Marry, MD  Tamsulosin HCl (FLOMAX)  0.4 MG CAPS Take 0.4 mg by mouth daily.     [provider]  valsartan-hydrochlorothiazide (DIOVAN-HCT) 80-12.5 MG tablet TAKE 1 TABLET BY MOUTH EVERY DAY 01/05/20   Hali Marry, MD    Family History Family History  Problem Relation Age of Onset  . COPD Mother   . Hypertension Mother   . Emphysema Mother     Social History Social History   Tobacco Use  . Smoking status: Former Smoker    Quit date: 02/09/1980    Years since quitting: 40.2  . Smokeless tobacco: Former Network engineer Use Topics  . Alcohol use: Yes    Comment: occasionally  . Drug use: No     Allergies   Amoxicillin, Iodinated diagnostic agents, and Lisinopril   Review of Systems Review of Systems  Constitutional: Negative for chills and fever.  Musculoskeletal: Positive for gait problem (cannot walk normal on Left foot due to pain).  Skin: Positive for wound. Negative for color change.     Physical Exam Triage Vital Signs ED Triage Vitals  Enc Vitals Group     BP 04/23/20 1141 (!) 169/73     Pulse Rate 04/23/20 1141 67     Resp 04/23/20 1141 18     Temp 04/23/20 1141 98.9 F (37.2 C)     Temp Source 04/23/20 1141 Oral     SpO2 04/23/20 1141 99 %     Weight --      Height --      Head Circumference --      Peak Flow --      Pain Score 04/23/20 1140 5     Pain Loc --      Pain Edu? --      Excl. in [place]? --    No data found.  Updated Vital Signs BP (!) 169/73 (BP Location: Right Arm)   Pulse 67   Temp 98.9 F (37.2 C) (Oral)   Resp 18   SpO2 99%   Visual Acuity Right Eye Distance:   Left Eye Distance:   Bilateral Distance:    Right Eye Near:   Left Eye Near:    Bilateral Near:     Physical Exam Vitals and nursing note reviewed.  Constitutional:      Appearance: Normal appearance. He is well-developed.  HENT:     Head: Normocephalic and atraumatic.  Cardiovascular:     Rate and Rhythm: Normal rate.  Pulmonary:     Effort: Pulmonary effort is normal.    Musculoskeletal:        General: Normal range of motion.     Cervical back: Normal range of motion.  Feet:  Skin:    General: Skin is warm and dry.  Neurological:     Mental Status: He is alert and oriented to person, place, and time.  Psychiatric:        Behavior: Behavior normal.      UC Treatments / Results  Labs (all labs ordered are listed, but only abnormal results are displayed) Labs Reviewed - No data to display  EKG   Radiology No results found.  Procedures Procedures (including critical care time)  Medications Ordered in UC Medications - No data to display  Initial Impression / Assessment and Plan / UC Course  I have reviewed the triage vital signs and the nursing notes.  Pertinent labs & imaging results that were available during my care of the patient were reviewed by me and considered in my medical decision making (see chart for details).    Radiographs would likely no show wood splinter, held off on imaging today.  Discussed with pt procedure attempt to further visualize area and remove any additional splint that can be visualized easily/minimal debridement of puncture area. Advised risk of pain and infection.  Pt verbalized agreement to procedure.  Wound was cleaned with SAF-clens Area was anesthetized using 2% xylocaine w/o epi.  Surrounding dried skin removed so wound could be further explored. No foreign bodies seen or palpated. Pt tolerated procedure well w/o complication.  Bacitracin and thick bandage applied. Pt has crutches at home to use if needed. Encouraged to soak foot in warm water and epson salt. Will start pt on antibiotics in case there is still a small amount of wood remaining deep within the puncture wound.  Encouraged conservative treatment  F/u with Podiatry next week if still having tenderness. AVS provided  Final Clinical Impressions(s) / UC Diagnoses   Final diagnoses:  Splinter of left foot, initial encounter      Discharge Instructions      There was no remaining splinted seen on exam today. You should soak your foot in warm water and epson salt 2-3 times daily for 15-20 minutes at a time. Apply an over the counter antibiotic ointment for up to 5 days and a cushioned bandage to help with pain.  Please take antibiotics as prescribed and be sure to complete entire course even if you start to feel better to ensure infection does not come back.  Call to schedule a follow up appointment with a podiatrist, Dr. Jacqualyn Posey comes to [facility] on Fridays.     ED Prescriptions    Medication Sig Dispense Auth. Provider   doxycycline (VIBRAMYCIN) 100 MG capsule  (Status: Discontinued) Take 1 capsule (100 mg total) by mouth 2 (two) times daily. One po bid x 7 days 14 capsule Gerarda Fraction, Kazi Montoro O, PA-C   doxycycline (VIBRAMYCIN) 100 MG capsule Take 1 capsule (100 mg total) by mouth 2 (two) times daily. One po bid x 7 days 14 capsule Noe Gens, [place]     PDMP not reviewed this encounter.   Noe Gens, [place] 04/23/20 [phone]

## 2020-04-23 NOTE — ED Triage Notes (Signed)
Patient presents to Urgent Care with complaints of splinter in his left foot since 4 days ago. Patient reports he was walking out onto his deck barefooted and got a large piece of wood lodged in his foot. Pt states his wife pulled it out but he wonders if there is still a small piece remaining, foot is still very sensitive.

## 2020-05-03 ENCOUNTER — Other Ambulatory Visit: Payer: Self-pay | Admitting: Family Medicine

## 2020-05-03 DIAGNOSIS — E785 Hyperlipidemia, unspecified: Secondary | ICD-10-CM

## 2020-05-11 DIAGNOSIS — L218 Other seborrheic dermatitis: Secondary | ICD-10-CM | POA: Diagnosis not present

## 2020-05-11 DIAGNOSIS — L570 Actinic keratosis: Secondary | ICD-10-CM | POA: Diagnosis not present

## 2020-05-11 DIAGNOSIS — Z86008 Personal history of in-situ neoplasm of other site: Secondary | ICD-10-CM | POA: Diagnosis not present

## 2020-07-22 DIAGNOSIS — N3943 Post-void dribbling: Secondary | ICD-10-CM | POA: Diagnosis not present

## 2020-07-22 DIAGNOSIS — N401 Enlarged prostate with lower urinary tract symptoms: Secondary | ICD-10-CM | POA: Diagnosis not present

## 2020-07-23 DIAGNOSIS — N401 Enlarged prostate with lower urinary tract symptoms: Secondary | ICD-10-CM | POA: Diagnosis not present

## 2020-07-23 DIAGNOSIS — N3943 Post-void dribbling: Secondary | ICD-10-CM | POA: Diagnosis not present

## 2020-07-23 DIAGNOSIS — N400 Enlarged prostate without lower urinary tract symptoms: Secondary | ICD-10-CM | POA: Diagnosis not present

## 2020-07-23 DIAGNOSIS — N3001 Acute cystitis with hematuria: Secondary | ICD-10-CM | POA: Diagnosis not present

## 2020-07-23 DIAGNOSIS — R32 Unspecified urinary incontinence: Secondary | ICD-10-CM | POA: Diagnosis not present

## 2020-07-28 DIAGNOSIS — B369 Superficial mycosis, unspecified: Secondary | ICD-10-CM | POA: Diagnosis not present

## 2020-07-28 DIAGNOSIS — H6063 Unspecified chronic otitis externa, bilateral: Secondary | ICD-10-CM | POA: Diagnosis not present

## 2020-08-05 DIAGNOSIS — B369 Superficial mycosis, unspecified: Secondary | ICD-10-CM | POA: Diagnosis not present

## 2020-08-05 DIAGNOSIS — R42 Dizziness and giddiness: Secondary | ICD-10-CM | POA: Diagnosis not present

## 2020-08-11 ENCOUNTER — Ambulatory Visit: Payer: Medicare Other

## 2020-08-23 DIAGNOSIS — N401 Enlarged prostate with lower urinary tract symptoms: Secondary | ICD-10-CM | POA: Diagnosis not present

## 2020-08-23 DIAGNOSIS — N3943 Post-void dribbling: Secondary | ICD-10-CM | POA: Diagnosis not present

## 2020-08-23 DIAGNOSIS — N3001 Acute cystitis with hematuria: Secondary | ICD-10-CM | POA: Diagnosis not present

## 2020-08-26 ENCOUNTER — Encounter: Payer: Medicare Other | Admitting: Family Medicine

## 2020-09-08 DIAGNOSIS — H6063 Unspecified chronic otitis externa, bilateral: Secondary | ICD-10-CM | POA: Diagnosis not present

## 2020-09-08 DIAGNOSIS — B369 Superficial mycosis, unspecified: Secondary | ICD-10-CM | POA: Diagnosis not present

## 2020-09-14 ENCOUNTER — Ambulatory Visit (INDEPENDENT_AMBULATORY_CARE_PROVIDER_SITE_OTHER): Payer: Medicare Other | Admitting: Family Medicine

## 2020-09-14 ENCOUNTER — Encounter: Payer: Self-pay | Admitting: Family Medicine

## 2020-09-14 VITALS — BP 133/52 | HR 59 | Ht 70.0 in | Wt 320.0 lb

## 2020-09-14 DIAGNOSIS — E119 Type 2 diabetes mellitus without complications: Secondary | ICD-10-CM | POA: Diagnosis not present

## 2020-09-14 DIAGNOSIS — N1832 Chronic kidney disease, stage 3b: Secondary | ICD-10-CM | POA: Diagnosis not present

## 2020-09-14 DIAGNOSIS — Z0000 Encounter for general adult medical examination without abnormal findings: Secondary | ICD-10-CM | POA: Diagnosis not present

## 2020-09-14 MED ORDER — AMBULATORY NON FORMULARY MEDICATION
0 refills | Status: DC
Start: 2020-09-14 — End: 2020-12-14

## 2020-09-14 NOTE — Patient Instructions (Signed)
  Tracy Bennett , Thank you for taking time to come for your Medicare Wellness Visit. I appreciate your ongoing commitment to your health goals. Please review the following plan we discussed and let me know if I can assist you in the future.   These are the goals we discussed: Goals    . DIET - INCREASE WATER INTAKE     Increase water intake to 5 bottles of water daily.    . Increase physical activity     Try to increase exercise bike to 10 minutes, 3 times per week.    . Weight (lb) < 200 lb (90.7 kg)     Would like to lose 19 pounds to get under 300lbs.       This is a list of the screening recommended for you and due dates:  Health Maintenance  Topic Date Due  . Complete foot exam   Never done  . Eye exam for diabetics  Never done  . Hemoglobin A1C  08/15/2019  . Flu Shot  06/27/2020  . Tetanus Vaccine  01/29/2023  . Colon Cancer Screening  10/01/2023  . COVID-19 Vaccine  Completed  .  Hepatitis C: One time screening is recommended by Center for Disease Control  (CDC) for  adults born from [birth year] through 1965.   Completed  . Pneumonia vaccines  Completed

## 2020-09-14 NOTE — Progress Notes (Signed)
Subjective:   Tracy Bennett is a 74 y.o. male who presents for Medicare Annual/Subsequent preventive examination.  Review of Systems    Ros is negative.   Cardiac Risk Factors include: advanced age (>16mn, >>19women);diabetes mellitus;male gender;obesity (BMI >30kg/m2)     Objective:    Today's Vitals   09/14/20 0915 09/14/20 0956  BP: (!) 146/58 (!) 133/52  Pulse: 64 (!) 59  SpO2: 97%   Weight: (!) 320 lb (145.2 kg)   Height: _0  (1.778 m)    Body mass index is 45.92 kg/m.   Physical Exam Constitutional:      Appearance: He is well-developed.  HENT:     Head: Normocephalic and atraumatic.  Cardiovascular:     Rate and Rhythm: Normal rate and regular rhythm.     Heart sounds: Normal heart sounds.  Pulmonary:     Effort: Pulmonary effort is normal.     Breath sounds: Normal breath sounds.  Skin:    General: Skin is warm and dry.  Neurological:     Mental Status: He is alert and oriented to person, place, and time.  Psychiatric:        Behavior: Behavior normal.      Advanced Directives 08/11/2019 08/05/2018 01/21/2018 12/15/2015  Does Patient Have a Medical Advance Directive? Yes Yes Yes No  Type of Advance Directive Living will;Healthcare Power of Attorney Living [place] -  Does patient want to make changes to medical advance directive? No - Patient declined Yes (MAU/Ambulatory/Procedural Areas - Information given) - -  Copy of [place] Chart? No - copy requested - No - copy requested -  Would patient like information on creating a medical advance directive? - - - No - patient declined information    Current Medications (verified) Outpatient Encounter Medications as of 09/14/2020  Medication Sig  . ACCU-CHEK FASTCLIX LANCETS MISC For testing blood sugars daily. Dx: E11.9  . blood glucose meter kit and supplies KIT Dispense based on patient and insurance preference. Use up to once a day as directed.  (FOR ICD-9 250.00, 250.01).  . finasteride (PROSCAR) 5 MG tablet Take 1 tablet (5 mg total) by mouth daily.  . fluconazole (DIFLUCAN) 150 MG tablet   . fluocinonide (LIDEX) 0.05 % external solution Apply 1 application topically 2 (two) times daily as needed (DRY SKIN).  .Marland Kitchenglucose blood (ACCU-CHEK GUIDE) test strip Check fasting blood sugar every morning. Dx: E11.9  . loratadine (CLARITIN) 10 MG tablet Take 10 mg by mouth daily as needed for allergies.   .Marland Kitchenoxybutynin (DITROPAN-XL) 10 MG 24 hr tablet TAKE ONE TABLET (10 MG DOSE) BY MOUTH DAILY.  . pantoprazole (PROTONIX) 40 MG tablet Take 1 tablet (40 mg total) by mouth daily.  . simvastatin (ZOCOR) 40 MG tablet TAKE 1 TABLET (40 MG TOTAL) BY MOUTH DAILY AT 6 PM.  . Tamsulosin HCl (FLOMAX) 0.4 MG CAPS Take 0.4 mg by mouth daily.   . valsartan-hydrochlorothiazide (DIOVAN-HCT) 80-12.5 MG tablet TAKE 1 TABLET BY MOUTH EVERY DAY  . AMBULATORY NON FORMULARY MEDICATION Medication Name: Shingrix IM x 1. Repeat in 2-6 months.  . [DISCONTINUED] doxycycline (VIBRAMYCIN) 100 MG capsule Take 1 capsule (100 mg total) by mouth 2 (two) times daily. One po bid x 7 days  . [DISCONTINUED] HYDROcodone-homatropine (HYCODAN) 5-1.5 MG/5ML syrup Take 5 mLs by mouth at bedtime as needed for cough.   No facility-administered encounter medications on file as of 09/14/2020.    Allergies (  verified) Amoxicillin, Iodinated diagnostic agents, and Lisinopril   History: Past Medical History:  Diagnosis Date  . Community acquired pneumonia of left lower lobe of lung 05/15/2018  . Dyspnea    with exertion  . Dysrhythmia    frequent PVC's  . Frequent urination   . GERD (gastroesophageal reflux disease)   . History of kidney stones   . Hyperlipidemia   . Hypertension   . Kidney stones    x 4   . Sleep apnea    BiPAP   Past Surgical History:  Procedure Laterality Date  . CARPAL TUNNEL RELEASE     Right   . CERVICAL FUSION     cervical fusion times 2  . FOOT  TENDON SURGERY  11/14/2011   Dr. Sharol Given  . left knee surgery     ACL repair   . LIPOMA EXCISION Bilateral    on back  . MASS EXCISION N/A 01/24/2018   Procedure: EXCISION OF BACK MASS;  Surgeon: Erroll Luna, MD;  Location: [facility];  Service: General;  Laterality: N/A;  . UMBILICAL HERNIA REPAIR     Family History  Problem Relation Age of Onset  . COPD Mother   . Hypertension Mother   . Emphysema Mother    Social History   Socioeconomic History  . Marital status: Married    Spouse name: Rose   . Number of children: 1  . Years of education: college  . Highest education level: Some college, no degree  Occupational History  . Occupation: Retired     Comment: Print production planner   Tobacco Use  . Smoking status: Former Smoker    Quit date: 02/09/1980    Years since quitting: 40.6  . Smokeless tobacco: Former Network engineer  . Vaping Use: Never used  Substance and Sexual Activity  . Alcohol use: Yes    Comment: occasionally  . Drug use: No  . Sexual activity: Yes  Other Topics Concern  . Not on file  Social History Narrative   Golf about 2 times per week.  No regular exercise.  . Member of [employer]   Social Determinants of Health   Financial Resource Strain:   . Difficulty of Paying Living Expenses: Not on file  Food Insecurity:   . Worried About Charity fundraiser in the Last Year: Not on file  . Ran Out of Food in the Last Year: Not on file  Transportation Needs:   . Lack of Transportation (Medical): Not on file  . Lack of Transportation (Non-Medical): Not on file  Physical Activity:   . Days of Exercise per Week: Not on file  . Minutes of Exercise per Session: Not on file  Stress:   . Feeling of Stress : Not on file  Social Connections:   . Frequency of Communication with Friends and Family: Not on file  . Frequency of Social Gatherings with Friends and Family: Not on file  . Attends Religious Services: Not on file  . Active Member of Clubs or  Organizations: Not on file  . Attends Archivist Meetings: Not on file  . Marital Status: Not on file    Tobacco Counseling Counseling given: Not Answered   Clinical Intake:  Pre-visit preparation completed: Yes  He did want a let me know that he was recently on a course of Cipro prescribed by his urologist for an infection.  He is also currently on Diflucan for a fungal ear infection and followed by  ENT, Lillette Boxer PA over at [facility].        BMI - recorded: 45 Nutritional Status: BMI > 30  Obese Diabetes: Yes CBG done?: No Did pt. bring in CBG monitor from home?: No     Diabetic?Yes, will need to get most recent eye exam on the chart.  Sees. Dr. Tonia Brooms.  Through [facility] Needed?: No      Activities of Daily Living In your present state of health, do you have any difficulty performing the following activities: 09/14/2020  Hearing? N  Vision? N  Difficulty concentrating or making decisions? N  Walking or climbing stairs? N  Dressing or bathing? N  Doing errands, shopping? N  Preparing Food and eating ? N  Using the Toilet? N  In the past six months, have you accidently leaked urine? N  Do you have problems with loss of bowel control? N  Managing your Medications? N  Managing your Finances? N  Housekeeping or managing your Housekeeping? N  Some recent data might be hidden    Patient Care Team: Hali Marry, MD as PCP - General (Family Medicine) Sonnie Alamo, MD as Referring Physician (Gastroenterology) Deboraha Sprang, MD as Consulting Physician (Cardiology) Gwendel Hanson, MD as Referring Physician (Urology)  Indicate any recent Medical Services you may have received from other than [facility] providers in the past year (date may be approximate).     Assessment:   This is a routine wellness examination for [place].  Hearing/Vision screen No exam data present  Dietary issues and exercise activities  discussed: Current Exercise Habits: Home exercise routine, Frequency (Times/Week): 3  Goals    . DIET - INCREASE WATER INTAKE     Increase water intake to 5 bottles of water daily.    . Increase physical activity     Try to increase exercise bike to 10 minutes, 3 times per week.    . Weight (lb) < 200 lb (90.7 kg)     Would like to lose 19 pounds to get under 300lbs.      Depression Screen PHQ 2/9 Scores 09/14/2020 08/11/2019 05/22/2019 04/08/2019 02/12/2019 08/05/2018 05/20/2018  PHQ - 2 Score 0 1 0 0 0 0 4  PHQ- 9 Score - - - - - - 9    Fall Risk Fall Risk  09/14/2020 08/11/2019 05/22/2019 02/12/2019 08/05/2018  Falls in the past year? 0 0 0 0 No  Number falls in past yr: - 0 0 0 -  Injury with Fall? - 0 - 0 -  Risk for fall due to : No Fall Risks - - - -  Follow up - Falls prevention discussed - - -    Any stairs in or around the home? Yes  If so, are there any without handrails? Yes  Home free of loose throw rugs in walkways, pet beds, electrical cords, etc? Yes  Adequate lighting in your home to reduce risk of falls? Yes   ASSISTIVE DEVICES UTILIZED TO PREVENT FALLS:  Life alert? No  Use of a cane, walker or w/c? No  Grab bars in the bathroom? No  Shower chair or bench in shower? No  Elevated toilet seat or a handicapped toilet? No   TIMED UP AND GO:  Was the test performed? No .   Gait steady and fast without use of assistive device  Cognitive Function: MMSE - Mini Mental State Exam 08/05/2018  Orientation to time 5  Orientation to Place 5  Registration 3  Attention/ Calculation 5  Recall 3  Language- name 2 objects 2  Language- repeat 1  Language- follow 3 step command 3  Language- read & follow direction 1  Write a sentence 1  Copy design 1  Total score 30     6CIT Screen 09/14/2020 08/11/2019 02/14/2017  What Year? 0 points 0 points 0 points  What month? 0 points 0 points 0 points  What time? 0 points 0 points 0 points  Count back from 20 0 points 0 points  0 points  Months in reverse 0 points 0 points 0 points  Repeat phrase 0 points 0 points 4 points  Total Score 0 0 4    Immunizations Immunization History  Administered Date(s) Administered  . Influenza Split 11/27/2012, 12/16/2012  . Influenza, High Dose Seasonal PF 09/17/2018, 09/24/2019  . Influenza, Seasonal, Injecte, Preservative Fre 12/11/2013  . Influenza,inj,Quad PF,6+ Mos 09/01/2014, 08/27/2015, 08/27/2016  . Influenza-Unspecified 09/24/2017  . PFIZER SARS-COV-2 Vaccination 03/18/2020, 04/08/2020  . Pneumococcal Conjugate-13 02/14/2017  . Pneumococcal Polysaccharide-23 02/09/2012  . Tdap 01/28/2013  . Zoster 09/15/2011    TDAP status: Up to date Flu Vaccine status: Up to date Pneumococcal vaccine status: Up to date Covid-19 vaccine status: Completed vaccines  Qualifies for Shingles Vaccine? Yes   Zostavax completed Yes   Shingrix Completed?: No.    Education has been provided regarding the importance of this vaccine. Patient has been advised to call insurance company to determine out of pocket expense if they have not yet received this vaccine. Advised may also receive vaccine at local pharmacy or Health Dept. Verbalized acceptance and understanding.  Screening Tests Health Maintenance  Topic Date Due  . FOOT EXAM  Never done  . OPHTHALMOLOGY EXAM  Never done  . HEMOGLOBIN A1C  08/15/2019  . INFLUENZA VACCINE  02/24/2021 (Originally 06/27/2020)  . TETANUS/TDAP  01/29/2023  . COLONOSCOPY  10/01/2023  . COVID-19 Vaccine  Completed  . Hepatitis C Screening  Completed  . PNA vac Low Risk Adult  Completed    Health Maintenance  Health Maintenance Due  Topic Date Due  . FOOT EXAM  Never done  . OPHTHALMOLOGY EXAM  Never done  . HEMOGLOBIN A1C  08/15/2019    Colorectal cancer screening: Completed UTD. Marland Kitchen Repeat every 10 years  Lung Cancer Screening: (Low Dose CT Chest recommended if Age 57-80 years, 30 pack-year currently smoking OR have quit w/in 15years.) does  not qualify.   Lung Cancer Screening Referral: no  Additional Screening:  Hepatitis C Screening:  Completed -yes  Vision Screening: Recommended annual ophthalmology exams for early detection of glaucoma and other disorders of the eye. Is the patient up to date with their annual eye exam?  Yes  Who is the provider or what is the name of the office in which the patient attends annual eye exams? Dr. Tonia Brooms If pt is not established with a provider, would they like to be referred to a provider to establish care? No .   Dental Screening: Recommended annual dental exams for proper oral hygiene  Community Resource Referral / Chronic Care Management: CRR required this visit?  No   CCM required this visit?  No      Plan:     I have personally reviewed and noted the following in the patient's chart:   . Medical and social history . Use of alcohol, tobacco or illicit drugs  . Current medications and supplements . Functional ability and status . Nutritional status .  Physical activity . Advanced directives . List of other physicians . Hospitalizations, surgeries, and ER visits in previous 12 months . Vitals . Screenings to include cognitive, depression, and falls . Referrals and appointments  In addition, I have reviewed and discussed with patient certain preventive protocols, quality metrics, and best practice recommendations. A written personalized care plan for preventive services as well as general preventive health recommendations were provided to patient.     Beatrice Lecher, MD   09/14/2020

## 2020-09-15 ENCOUNTER — Other Ambulatory Visit: Payer: Self-pay | Admitting: Family Medicine

## 2020-09-15 DIAGNOSIS — H6063 Unspecified chronic otitis externa, bilateral: Secondary | ICD-10-CM | POA: Diagnosis not present

## 2020-09-15 DIAGNOSIS — B369 Superficial mycosis, unspecified: Secondary | ICD-10-CM | POA: Diagnosis not present

## 2020-09-15 LAB — LIPID PANEL W/REFLEX DIRECT LDL
Cholesterol: 128 mg/dL (ref <200)
HDL: 31 mg/dL — ABNORMAL LOW (ref >=40)
LDL Cholesterol (Calc): 78 mg/dL (calc)
Non-HDL Cholesterol (Calc): 97 mg/dL (calc) (ref <130)
Total CHOL/HDL Ratio: 4.1 (calc) (ref <5.0)
Triglycerides: 107 mg/dL (ref <150)

## 2020-09-15 LAB — COMPLETE METABOLIC PANEL WITH GFR
AG Ratio: 1.8 (calc) (ref 1.0–2.5)
ALT: 36 U/L (ref 9–46)
AST: 26 U/L (ref 10–35)
Albumin: 4.3 g/dL (ref 3.6–5.1)
Alkaline phosphatase (APISO): 50 U/L (ref 35–144)
BUN/Creatinine Ratio: 13 (calc) (ref 6–22)
BUN: 19 mg/dL (ref 7–25)
CO2: 26 mmol/L (ref 20–32)
Calcium: 9.5 mg/dL (ref 8.6–10.3)
Chloride: 102 mmol/L (ref 98–110)
Creat: 1.46 mg/dL — ABNORMAL HIGH (ref 0.70–1.18)
GFR, Est African American: 54 mL/min/{1.73_m2} — ABNORMAL LOW (ref >=60)
GFR, Est Non African American: 47 mL/min/{1.73_m2} — ABNORMAL LOW (ref >=60)
Globulin: 2.4 g/dL (calc) (ref 1.9–3.7)
Glucose, Bld: 125 mg/dL — ABNORMAL HIGH (ref 65–99)
Potassium: 4.5 mmol/L (ref 3.5–5.3)
Sodium: 137 mmol/L (ref 135–146)
Total Bilirubin: 0.9 mg/dL (ref 0.2–1.2)
Total Protein: 6.7 g/dL (ref 6.1–8.1)

## 2020-09-15 LAB — HEMOGLOBIN A1C
Hgb A1c MFr Bld: 6.7 % of total Hgb — ABNORMAL HIGH (ref <5.7)
Mean Plasma Glucose: 146 (calc)
eAG (mmol/L): 8.1 (calc)

## 2020-09-16 NOTE — Addendum Note (Signed)
Addended by: Beatrice Lecher D on: 09/16/2020 01:14 PM   Modules accepted: Orders

## 2020-09-30 ENCOUNTER — Other Ambulatory Visit: Payer: Self-pay | Admitting: Family Medicine

## 2020-10-07 DIAGNOSIS — Z23 Encounter for immunization: Secondary | ICD-10-CM | POA: Diagnosis not present

## 2020-10-12 DIAGNOSIS — Z23 Encounter for immunization: Secondary | ICD-10-CM | POA: Diagnosis not present

## 2020-10-27 DIAGNOSIS — B369 Superficial mycosis, unspecified: Secondary | ICD-10-CM | POA: Diagnosis not present

## 2020-10-29 ENCOUNTER — Other Ambulatory Visit: Payer: Self-pay | Admitting: Family Medicine

## 2020-11-09 DIAGNOSIS — Z86008 Personal history of in-situ neoplasm of other site: Secondary | ICD-10-CM | POA: Diagnosis not present

## 2020-11-09 DIAGNOSIS — L570 Actinic keratosis: Secondary | ICD-10-CM | POA: Diagnosis not present

## 2020-11-09 DIAGNOSIS — L2089 Other atopic dermatitis: Secondary | ICD-10-CM | POA: Diagnosis not present

## 2020-11-09 DIAGNOSIS — L820 Inflamed seborrheic keratosis: Secondary | ICD-10-CM | POA: Diagnosis not present

## 2020-11-09 DIAGNOSIS — D485 Neoplasm of uncertain behavior of skin: Secondary | ICD-10-CM | POA: Diagnosis not present

## 2020-12-14 ENCOUNTER — Ambulatory Visit (INDEPENDENT_AMBULATORY_CARE_PROVIDER_SITE_OTHER): Payer: Medicare Other

## 2020-12-14 ENCOUNTER — Ambulatory Visit (INDEPENDENT_AMBULATORY_CARE_PROVIDER_SITE_OTHER): Payer: Medicare Other | Admitting: Family Medicine

## 2020-12-14 ENCOUNTER — Encounter: Payer: Self-pay | Admitting: Family Medicine

## 2020-12-14 ENCOUNTER — Other Ambulatory Visit: Payer: Self-pay

## 2020-12-14 VITALS — BP 138/76 | HR 83 | Ht 70.0 in | Wt 327.0 lb

## 2020-12-14 DIAGNOSIS — M79601 Pain in right arm: Secondary | ICD-10-CM | POA: Diagnosis not present

## 2020-12-14 DIAGNOSIS — M5412 Radiculopathy, cervical region: Secondary | ICD-10-CM

## 2020-12-14 DIAGNOSIS — M4322 Fusion of spine, cervical region: Secondary | ICD-10-CM | POA: Diagnosis not present

## 2020-12-14 DIAGNOSIS — Z981 Arthrodesis status: Secondary | ICD-10-CM | POA: Diagnosis not present

## 2020-12-14 MED ORDER — CYCLOBENZAPRINE HCL 10 MG PO TABS
10.0000 mg | ORAL_TABLET | Freq: Every evening | ORAL | 0 refills | Status: DC | PRN
Start: 2020-12-14 — End: 2021-08-25

## 2020-12-14 MED ORDER — PREDNISONE 20 MG PO TABS
40.0000 mg | ORAL_TABLET | Freq: Every day | ORAL | 0 refills | Status: DC
Start: 2020-12-14 — End: 2020-12-20

## 2020-12-14 NOTE — Progress Notes (Signed)
He reports the pain has been going on x 5 days. It started in his R upper arm felt like a dull nagging pain that feels sharper in the muscle area. He said that the pain then began to move up to his shoulder and in to the center of his back. He used his TENS unit/ice/heat with no relief. He the stated that from that point the pain moved over underneath his L shoulder blade then into the muscle. He said that it has been very had for him to sleep. And that the pain bothers him more with movement. If his is sitting up or standing he's fine. He rated the pain 8/10. Denies any SOB/CP/light headedness.  He did say that prior to this he had been doing some yard work 2 wks prior. He said that he did have some pain in his low back off to the L side but this is not bothering him now.

## 2020-12-14 NOTE — Progress Notes (Signed)
Acute Office Visit  Subjective:    Patient ID: Tracy Bennett, male    DOB: 1946/06/07, 75 y.o.   MRN: [id number]  Chief Complaint  Patient presents with  . Arm Pain    HPI Patient is in today for   He reports the pain has been going on x 5 days. It started in his R upper arm felt like a sharp pain inititall. It woke him up out of his sleep. He got up and pain bc more dull nagging pain. Finally went back to bed. Next day it had eased off. He said that the pain then began to move up to his shoulder and in to the center of his back. Has been getting a burning sensation in her upper back bt shoulder blades. He used his TENS unit/ice/heat with no relief. Ice helped the most. He the stated that from that point the pain moved over underneath his L shoulder blade then into the muscle. He said that it has been very had for him to sleep. And that the pain bothers him more with movement. If his is sitting up or standing he's fine. He rated the pain 8/10. Denies any SOB/CP/light headedness.  He did say that prior to this he had been doing some yard work 2 wks prior. He said that he did have some pain in his low back off to the L side but this is not bothering him now. Says it is better now.   Prior hx of cervical fusion from C3-C7.    Past Medical History:  Diagnosis Date  . Community acquired pneumonia of left lower lobe of lung 05/15/2018  . Dyspnea    with exertion  . Dysrhythmia    frequent PVC's  . Frequent urination   . GERD (gastroesophageal reflux disease)   . History of kidney stones   . Hyperlipidemia   . Hypertension   . Kidney stones    x 4   . Sleep apnea    BiPAP    Past Surgical History:  Procedure Laterality Date  . CARPAL TUNNEL RELEASE     Right   . CERVICAL FUSION     cervical fusion times 2  . FOOT TENDON SURGERY  11/14/2011   Dr. Sharol Given  . left knee surgery     ACL repair   . LIPOMA EXCISION Bilateral    on back  . MASS EXCISION N/A 01/24/2018   Procedure:  EXCISION OF BACK MASS;  Surgeon: Erroll Luna, MD;  Location: [place];  Service: General;  Laterality: N/A;  . UMBILICAL HERNIA REPAIR      Family History  Problem Relation Age of Onset  . COPD Mother   . Hypertension Mother   . Emphysema Mother     Social History   Socioeconomic History  . Marital status: Married    Spouse name: Rose   . Number of children: 1  . Years of education: college  . Highest education level: Some college, no degree  Occupational History  . Occupation: Retired     Comment: Print production planner   Tobacco Use  . Smoking status: Former Smoker    Quit date: 02/09/1980    Years since quitting: 40.8  . Smokeless tobacco: Former Network engineer  . Vaping Use: Never used  Substance and Sexual Activity  . Alcohol use: Yes    Comment: occasionally  . Drug use: No  . Sexual activity: Yes  Other Topics Concern  . Not on file  Social History Narrative   Golf about 2 times per week.  No regular exercise.  . Member of [employer]   Social Determinants of Health   Financial Resource Strain: Not on file  Food Insecurity: Not on file  Transportation Needs: Not on file  Physical Activity: Not on file  Stress: Not on file  Social Connections: Not on file  Intimate Partner Violence: Not on file    Outpatient Medications Prior to Visit  Medication Sig Dispense Refill  . finasteride (PROSCAR) 5 MG tablet Take 1 tablet (5 mg total) by mouth daily. 30 tablet 1  . fluocinonide (LIDEX) 0.05 % external solution Apply 1 application topically 2 (two) times daily as needed (DRY SKIN).    Marland Kitchen loratadine (CLARITIN) 10 MG tablet Take 10 mg by mouth daily as needed for allergies.     Marland Kitchen oxybutynin (DITROPAN-XL) 10 MG 24 hr tablet Take 10 mg by mouth daily.    . pantoprazole (PROTONIX) 40 MG tablet TAKE 1 TABLET BY MOUTH EVERY DAY 90 tablet 1  . simvastatin (ZOCOR) 40 MG tablet TAKE 1 TABLET (40 MG TOTAL) BY MOUTH DAILY AT 6 PM. 90 tablet 3  . tamsulosin (FLOMAX) 0.4 MG  CAPS capsule Take 0.4 mg by mouth daily.     . valsartan-hydrochlorothiazide (DIOVAN-HCT) 80-12.5 MG tablet TAKE 1 TABLET BY MOUTH EVERY DAY 90 tablet 1  . Accu-Chek FastClix Lancets MISC USE TO TEST BLOOD SUGAR ONCE DAILY 102 each 11  . ACCU-CHEK GUIDE test strip CHECK FASTING BLOOD SUGAR EVERY MORNING. DX: E11.9 100 strip 11  . AMBULATORY NON FORMULARY MEDICATION Medication Name: Shingrix IM x 1. Repeat in 2-6 months. 1 Units 0  . blood glucose meter kit and supplies KIT Dispense based on patient and insurance preference. Use up to once a day as directed. (FOR ICD-9 250.00, 250.01). 1 each 0  . oxybutynin (DITROPAN-XL) 10 MG 24 hr tablet TAKE ONE TABLET (10 MG DOSE) BY MOUTH DAILY.     No facility-administered medications prior to visit.    Allergies  Allergen Reactions  . Amoxicillin Swelling    Tongue sores  . Iodinated Diagnostic Agents Shortness Of Breath and Rash    Patient gets hot and sweating  . Lisinopril Cough and Rash    Other reaction(s): Cough    Review of Systems     Objective:    Physical Exam  BP 138/76   Pulse 83   Ht _0  (1.778 m)   Wt (!) 327 lb (148.3 kg)   SpO2 98%   BMI 46.92 kg/m  Wt Readings from Last 3 Encounters:  12/14/20 (!) 327 lb (148.3 kg)  09/14/20 (!) 320 lb (145.2 kg)  01/06/20 (!) 319 lb (144.7 kg)    Health Maintenance Due  Topic Date Due  . FOOT EXAM  Never done  . OPHTHALMOLOGY EXAM  Never done    There are no preventive care reminders to display for this patient.   Lab Results  Component Value Date   TSH 1.153 10/05/2015   Lab Results  Component Value Date   WBC 12.0 (H) 02/12/2019   HGB 15.2 02/12/2019   HCT 45.2 02/12/2019   MCV 85.1 02/12/2019   PLT 179 02/12/2019   Lab Results  Component Value Date   NA 137 09/14/2020   K 4.5 09/14/2020   CO2 26 09/14/2020   GLUCOSE 125 (H) 09/14/2020   BUN 19 09/14/2020   CREATININE 1.46 (H) 09/14/2020   BILITOT 0.9 09/14/2020  ALKPHOS 48 01/21/2018   AST 26  09/14/2020   ALT 36 09/14/2020   PROT 6.7 09/14/2020   ALBUMIN 3.7 01/21/2018   CALCIUM 9.5 09/14/2020   ANIONGAP 10 01/21/2018   Lab Results  Component Value Date   CHOL 128 09/14/2020   Lab Results  Component Value Date   HDL 31 (L) 09/14/2020   Lab Results  Component Value Date   LDLCALC 78 09/14/2020   Lab Results  Component Value Date   TRIG 107 09/14/2020   Lab Results  Component Value Date   CHOLHDL 4.1 09/14/2020   Lab Results  Component Value Date   HGBA1C 6.7 (H) 09/14/2020       Assessment & Plan:   Problem List Items Addressed This Visit   None   Visit Diagnoses    Cervical radicular pain    -  Primary   Relevant Orders   DG Cervical Spine Complete   Right arm pain       Relevant Orders   DG Cervical Spine Complete     Right upper arm pain-sounds radicular in nature probably from the cervical spine he has had multiple cervical fusions we will get x-ray today just to make sure that hardware looks okay nothing unusual there.  The meantime recommend a trial of prednisone which she says has worked well for him in the past when he has had back pain etc.  Also given a muscle relaxer to take in the evenings did warn about potential for sedation.  If he is not improving over the next week please let me know.  Upper back pain-most consistent with pain over the trapezius muscles.  Recommend ice/heat and muscle relaxer.  Meds ordered this encounter  Medications  . predniSONE (DELTASONE) 20 MG tablet    Sig: Take 2 tablets (40 mg total) by mouth daily with breakfast.    Dispense:  10 tablet    Refill:  0  . cyclobenzaprine (FLEXERIL) 10 MG tablet    Sig: Take 1 tablet (10 mg total) by mouth at bedtime as needed for muscle spasms.    Dispense:  30 tablet    Refill:  0     Beatrice Lecher, MD

## 2020-12-15 ENCOUNTER — Encounter: Payer: Self-pay | Admitting: Family Medicine

## 2020-12-15 ENCOUNTER — Ambulatory Visit: Payer: Medicare Other | Admitting: Family Medicine

## 2020-12-20 ENCOUNTER — Encounter: Payer: Self-pay | Admitting: Family Medicine

## 2020-12-20 MED ORDER — PREDNISONE 20 MG PO TABS: 20.0000 mg | ORAL_TABLET | Freq: Every day | ORAL | 0 refills | Status: DC

## 2021-02-07 DIAGNOSIS — Z9889 Other specified postprocedural states: Secondary | ICD-10-CM | POA: Diagnosis not present

## 2021-02-07 DIAGNOSIS — Z9842 Cataract extraction status, left eye: Secondary | ICD-10-CM | POA: Diagnosis not present

## 2021-02-07 DIAGNOSIS — H02833 Dermatochalasis of right eye, unspecified eyelid: Secondary | ICD-10-CM | POA: Diagnosis not present

## 2021-02-07 DIAGNOSIS — H04123 Dry eye syndrome of bilateral lacrimal glands: Secondary | ICD-10-CM | POA: Diagnosis not present

## 2021-02-07 DIAGNOSIS — H02836 Dermatochalasis of left eye, unspecified eyelid: Secondary | ICD-10-CM | POA: Diagnosis not present

## 2021-02-07 DIAGNOSIS — Z9841 Cataract extraction status, right eye: Secondary | ICD-10-CM | POA: Diagnosis not present

## 2021-02-07 DIAGNOSIS — H0288A Meibomian gland dysfunction right eye, upper and lower eyelids: Secondary | ICD-10-CM | POA: Diagnosis not present

## 2021-02-07 DIAGNOSIS — H0288B Meibomian gland dysfunction left eye, upper and lower eyelids: Secondary | ICD-10-CM | POA: Diagnosis not present

## 2021-02-07 DIAGNOSIS — Z961 Presence of intraocular lens: Secondary | ICD-10-CM | POA: Diagnosis not present

## 2021-02-22 DIAGNOSIS — N401 Enlarged prostate with lower urinary tract symptoms: Secondary | ICD-10-CM | POA: Diagnosis not present

## 2021-02-22 DIAGNOSIS — N3943 Post-void dribbling: Secondary | ICD-10-CM | POA: Diagnosis not present

## 2021-02-22 DIAGNOSIS — N390 Urinary tract infection, site not specified: Secondary | ICD-10-CM | POA: Diagnosis not present

## 2021-02-23 DIAGNOSIS — G4733 Obstructive sleep apnea (adult) (pediatric): Secondary | ICD-10-CM | POA: Diagnosis not present

## 2021-02-23 DIAGNOSIS — Z6841 Body Mass Index (BMI) 40.0 and over, adult: Secondary | ICD-10-CM | POA: Diagnosis not present

## 2021-03-21 DIAGNOSIS — H02832 Dermatochalasis of right lower eyelid: Secondary | ICD-10-CM | POA: Diagnosis not present

## 2021-03-21 DIAGNOSIS — Z961 Presence of intraocular lens: Secondary | ICD-10-CM | POA: Diagnosis not present

## 2021-03-21 DIAGNOSIS — H02833 Dermatochalasis of right eye, unspecified eyelid: Secondary | ICD-10-CM | POA: Diagnosis not present

## 2021-03-21 DIAGNOSIS — H02831 Dermatochalasis of right upper eyelid: Secondary | ICD-10-CM | POA: Diagnosis not present

## 2021-03-21 DIAGNOSIS — H1013 Acute atopic conjunctivitis, bilateral: Secondary | ICD-10-CM | POA: Diagnosis not present

## 2021-03-21 DIAGNOSIS — Z9889 Other specified postprocedural states: Secondary | ICD-10-CM | POA: Diagnosis not present

## 2021-03-21 DIAGNOSIS — H02835 Dermatochalasis of left lower eyelid: Secondary | ICD-10-CM | POA: Diagnosis not present

## 2021-03-21 DIAGNOSIS — H0288B Meibomian gland dysfunction left eye, upper and lower eyelids: Secondary | ICD-10-CM | POA: Diagnosis not present

## 2021-03-21 DIAGNOSIS — H04123 Dry eye syndrome of bilateral lacrimal glands: Secondary | ICD-10-CM | POA: Diagnosis not present

## 2021-03-21 DIAGNOSIS — H02836 Dermatochalasis of left eye, unspecified eyelid: Secondary | ICD-10-CM | POA: Diagnosis not present

## 2021-03-21 DIAGNOSIS — H02834 Dermatochalasis of left upper eyelid: Secondary | ICD-10-CM | POA: Diagnosis not present

## 2021-03-21 DIAGNOSIS — H0288A Meibomian gland dysfunction right eye, upper and lower eyelids: Secondary | ICD-10-CM | POA: Diagnosis not present

## 2021-03-21 LAB — HM DIABETES EYE EXAM

## 2021-03-24 ENCOUNTER — Other Ambulatory Visit: Payer: Self-pay | Admitting: Family Medicine

## 2021-03-24 DIAGNOSIS — I5030 Unspecified diastolic (congestive) heart failure: Secondary | ICD-10-CM | POA: Insufficient documentation

## 2021-03-24 DIAGNOSIS — R001 Bradycardia, unspecified: Secondary | ICD-10-CM | POA: Insufficient documentation

## 2021-03-24 DIAGNOSIS — R002 Palpitations: Secondary | ICD-10-CM | POA: Insufficient documentation

## 2021-04-05 ENCOUNTER — Ambulatory Visit (INDEPENDENT_AMBULATORY_CARE_PROVIDER_SITE_OTHER): Payer: Medicare Other | Admitting: Internal Medicine

## 2021-04-05 ENCOUNTER — Other Ambulatory Visit: Payer: Self-pay

## 2021-04-05 ENCOUNTER — Encounter: Payer: Self-pay | Admitting: Internal Medicine

## 2021-04-05 DIAGNOSIS — R001 Bradycardia, unspecified: Secondary | ICD-10-CM

## 2021-04-05 DIAGNOSIS — R002 Palpitations: Secondary | ICD-10-CM | POA: Diagnosis not present

## 2021-04-05 DIAGNOSIS — I5030 Unspecified diastolic (congestive) heart failure: Secondary | ICD-10-CM | POA: Diagnosis not present

## 2021-04-05 NOTE — Patient Instructions (Signed)

## 2021-04-05 NOTE — Progress Notes (Signed)
Patient Care Team: Hali Marry, MD as PCP - General (Family Medicine) Sonnie Alamo, MD as Referring Physician (Gastroenterology) Deboraha Sprang, MD as Consulting Physician (Cardiology) Gwendel Hanson, MD as Referring Physician (Urology) Bobette Mo, MD as Referring Physician (Ophthalmology)  CC PVCs  HPI  Tracy Bennett is a 75 y.o. male Seen in follow-up for PVCs -RVOT. He had no associated symptoms.  There was also a question at the initial consultation regarding long QT syndrome for which I found no evidence.   The patient denies chest pain,  nocturnal dyspnea, orthopnea trace intermittent peripheral edema.  There have been no palpitations, lightheadedness or syncope.  However, exercises much more limited now by back pain but also with dyspnea on exertion  He continues to struggle with his weight.  He has lost about 30 pounds.  Regained, now as heavy as he has been.  Previously had considered weight loss reduction surgery  Interval COVID.  Required home BiPAP.  (Already had) still with no smell 18 months later  DATE TEST EF   11/16 Echo    60 %   11/17 Echo   60-65 %           Date Cr K LDL  9/16   75  12/18 1.24 4.1   10/21 1.46 4.5      Past Medical History:  Diagnosis Date  . Community acquired pneumonia of left lower lobe of lung 05/15/2018  . Dyspnea    with exertion  . Dysrhythmia    frequent PVC's  . Frequent urination   . GERD (gastroesophageal reflux disease)   . History of kidney stones   . Hyperlipidemia   . Hypertension   . Kidney stones    x 4   . Sleep apnea    BiPAP    Past Surgical History:  Procedure Laterality Date  . CARPAL TUNNEL RELEASE     Right   . CERVICAL FUSION     cervical fusion times 2  . FOOT TENDON SURGERY  11/14/2011   Dr. Sharol Given  . left knee surgery     ACL repair   . LIPOMA EXCISION Bilateral    on back  . MASS EXCISION N/A 01/24/2018   Procedure: EXCISION OF BACK MASS;   Surgeon: Erroll Luna, MD;  Location: [place];  Service: General;  Laterality: N/A;  . UMBILICAL HERNIA REPAIR      Current Outpatient Medications  Medication Sig Dispense Refill  . cyclobenzaprine (FLEXERIL) 10 MG tablet Take 1 tablet (10 mg total) by mouth at bedtime as needed for muscle spasms. 30 tablet 0  . finasteride (PROSCAR) 5 MG tablet Take 1 tablet (5 mg total) by mouth daily. 30 tablet 1  . fluocinonide (LIDEX) 0.05 % external solution Apply 1 application topically 2 (two) times daily as needed (DRY SKIN).    Marland Kitchen loratadine (CLARITIN) 10 MG tablet Take 10 mg by mouth daily as needed for allergies.     Marland Kitchen oxybutynin (DITROPAN-XL) 10 MG 24 hr tablet Take 10 mg by mouth daily.    . pantoprazole (PROTONIX) 40 MG tablet TAKE 1 TABLET BY MOUTH EVERY DAY 90 tablet 1  . predniSONE (DELTASONE) 20 MG tablet Take 1 tablet (20 mg total) by mouth daily with breakfast. 5 tablet 0  . simvastatin (ZOCOR) 40 MG tablet TAKE 1 TABLET (40 MG TOTAL) BY MOUTH DAILY AT 6 PM. 90 tablet 3  . tamsulosin (FLOMAX) 0.4 MG CAPS capsule Take  0.4 mg by mouth daily.     . valsartan-hydrochlorothiazide (DIOVAN-HCT) 80-12.5 MG tablet TAKE 1 TABLET BY MOUTH EVERY DAY 90 tablet 1  . triamcinolone cream (KENALOG) 0.1 % as needed.     No current facility-administered medications for this visit.    Allergies  Allergen Reactions  . Amoxicillin Swelling    Tongue sores  . Iodinated Diagnostic Agents Shortness Of Breath and Rash    Patient gets hot and sweating  . Lisinopril Cough and Rash    Other reaction(s): Cough      Review of Systems negative except from HPI and PMH  Physical Exam BP (!) 150/72   Pulse 69   Wt (!) 332 lb (150.6 kg)   SpO2 97%   BMI 47.64 kg/m  Well developed Morbidly obese In NAD HENT normal Neck supple with JVP-  flat  Clear Regular rate and rhythm, no murmurs or gallops Abd-soft with active BS No Clubbing cyanosis  Tr edema Skin-warm and dry A & Oriented  Grossly normal  sensory and motor function  ECG sinus at 69 Intervals 13/09/36 Nonspecific ST-T changes T wave inversions more prominent than 3/20 which was likewise less prominent than December 2018  Assessment and  Plan  PVCs-RVOT  Hypertension and hypertensive heart disease  HFpEF  Sinus Bradycardia   Morbidly obese  Blood pressure reasonably controlled at home in the 130 range  No palpitations  Is euvolemic.  Discussed salt restriction.  Major limitation to activity relates to obesity and back pain.  He is no longer as I can tell age eligible for weight loss reduction surgery

## 2021-05-03 DIAGNOSIS — L820 Inflamed seborrheic keratosis: Secondary | ICD-10-CM | POA: Diagnosis not present

## 2021-05-03 DIAGNOSIS — L218 Other seborrheic dermatitis: Secondary | ICD-10-CM | POA: Diagnosis not present

## 2021-05-03 DIAGNOSIS — L570 Actinic keratosis: Secondary | ICD-10-CM | POA: Diagnosis not present

## 2021-05-03 DIAGNOSIS — D485 Neoplasm of uncertain behavior of skin: Secondary | ICD-10-CM | POA: Diagnosis not present

## 2021-05-03 DIAGNOSIS — L2089 Other atopic dermatitis: Secondary | ICD-10-CM | POA: Diagnosis not present

## 2021-05-03 DIAGNOSIS — Z86008 Personal history of in-situ neoplasm of other site: Secondary | ICD-10-CM | POA: Diagnosis not present

## 2021-05-17 DIAGNOSIS — Z23 Encounter for immunization: Secondary | ICD-10-CM | POA: Diagnosis not present

## 2021-06-08 DIAGNOSIS — H6123 Impacted cerumen, bilateral: Secondary | ICD-10-CM | POA: Diagnosis not present

## 2021-07-13 ENCOUNTER — Other Ambulatory Visit: Payer: Self-pay | Admitting: Family Medicine

## 2021-07-13 DIAGNOSIS — E785 Hyperlipidemia, unspecified: Secondary | ICD-10-CM

## 2021-08-02 ENCOUNTER — Other Ambulatory Visit: Payer: Self-pay | Admitting: Family Medicine

## 2021-08-02 NOTE — Telephone Encounter (Signed)
Appointment for bp required for refills. Pls call pt. 30 day supply given

## 2021-08-03 NOTE — Telephone Encounter (Signed)
Spoke to wife (rose) letting her know to get [place] to give [place] a call for an appt

## 2021-08-25 ENCOUNTER — Ambulatory Visit (INDEPENDENT_AMBULATORY_CARE_PROVIDER_SITE_OTHER): Payer: Medicare Other

## 2021-08-25 ENCOUNTER — Encounter: Payer: Self-pay | Admitting: Family Medicine

## 2021-08-25 ENCOUNTER — Ambulatory Visit (INDEPENDENT_AMBULATORY_CARE_PROVIDER_SITE_OTHER): Payer: Medicare Other | Admitting: Family Medicine

## 2021-08-25 ENCOUNTER — Other Ambulatory Visit: Payer: Self-pay

## 2021-08-25 VITALS — BP 158/70 | HR 82 | Ht 70.0 in | Wt 332.0 lb

## 2021-08-25 DIAGNOSIS — M79674 Pain in right toe(s): Secondary | ICD-10-CM

## 2021-08-25 DIAGNOSIS — I10 Essential (primary) hypertension: Secondary | ICD-10-CM | POA: Diagnosis not present

## 2021-08-25 DIAGNOSIS — E119 Type 2 diabetes mellitus without complications: Secondary | ICD-10-CM

## 2021-08-25 DIAGNOSIS — Z125 Encounter for screening for malignant neoplasm of prostate: Secondary | ICD-10-CM | POA: Diagnosis not present

## 2021-08-25 DIAGNOSIS — W208XXA Other cause of strike by thrown, projected or falling object, initial encounter: Secondary | ICD-10-CM | POA: Diagnosis not present

## 2021-08-25 DIAGNOSIS — E785 Hyperlipidemia, unspecified: Secondary | ICD-10-CM

## 2021-08-25 DIAGNOSIS — M19071 Primary osteoarthritis, right ankle and foot: Secondary | ICD-10-CM | POA: Diagnosis not present

## 2021-08-25 DIAGNOSIS — Z6841 Body Mass Index (BMI) 40.0 and over, adult: Secondary | ICD-10-CM | POA: Diagnosis not present

## 2021-08-25 DIAGNOSIS — S97101A Crushing injury of unspecified right toe(s), initial encounter: Secondary | ICD-10-CM | POA: Diagnosis not present

## 2021-08-25 DIAGNOSIS — S99921A Unspecified injury of right foot, initial encounter: Secondary | ICD-10-CM | POA: Diagnosis not present

## 2021-08-25 DIAGNOSIS — E118 Type 2 diabetes mellitus with unspecified complications: Secondary | ICD-10-CM | POA: Diagnosis not present

## 2021-08-25 LAB — POCT GLYCOSYLATED HEMOGLOBIN (HGB A1C): Hemoglobin A1C: 8.1 % — AB (ref 4.0–5.6)

## 2021-08-25 MED ORDER — VALSARTAN-HYDROCHLOROTHIAZIDE 160-12.5 MG PO TABS: 1.0000 | ORAL_TABLET | Freq: Every day | ORAL | 1 refills | Status: DC

## 2021-08-25 NOTE — Assessment & Plan Note (Addendum)
Increase Diovan HCT to 120 mg we will continue with just the 12.5 mg HCTZ.  We discussed really making some changes with his diet and losing some weight which would help with his blood pressure as well and get a see him back in 6 weeks.

## 2021-08-25 NOTE — Assessment & Plan Note (Signed)
A1c significantly elevated at 8.1 today up from previous of 6.7 admits he is slid back into some old habits as far as increased intake of sweets and carbs.  We discussed making some dietary changes he does seem motivated so we agreed to follow back up in about 6 weeks he will bring in his blood glucose log and we can take a look to see if we are making some significant improvements.  If not then we can discuss medication he is very hesitant about metformin so we did discuss the possibility of Ozempic or Trulicity.  Also encouraged him to work on increased activity level.

## 2021-08-25 NOTE — Progress Notes (Signed)
Pt injured his 2nd toe on R foot about 1.5-2 hours before his appt today. The area is swollen,painful,discolored

## 2021-08-25 NOTE — Progress Notes (Signed)
Acute Office Visit  Subjective:    Patient ID: Tracy Bennett, male    DOB: 08-12-1946, 75 y.o.   MRN: [id number]  Chief Complaint  Patient presents with   Hypertension   Hyperlipidemia   Toe Injury    HPI Patient is in today for follow-up  Hypertension- Pt denies chest pain, SOB, dizziness, or heart palpitations.  Taking meds as directed w/o problems.  Denies medication side effects.      Right foot 2nd toe injury  -dropped a potted plant on his toe.  He says now its throbbing its painful to walk on it.  Diabetes - no hypoglycemic events. No wounds or sores that are not healing well. No increased thirst or urination. Checking glucose at home. Taking medications as prescribed without any side effects.   Past Medical History:  Diagnosis Date   Community acquired pneumonia of left lower lobe of lung 05/15/2018   Dyspnea    with exertion   Dysrhythmia    frequent PVC's   Frequent urination    GERD (gastroesophageal reflux disease)    History of kidney stones    Hyperlipidemia    Hypertension    Kidney stones    x 4    Sleep apnea    BiPAP    Past Surgical History:  Procedure Laterality Date   CARPAL TUNNEL RELEASE     Right    CERVICAL FUSION     cervical fusion times 2   FOOT TENDON SURGERY  11/14/2011   Dr. Sharol Given   left knee surgery     ACL repair    LIPOMA EXCISION Bilateral    on back   MASS EXCISION N/A 01/24/2018   Procedure: EXCISION OF BACK MASS;  Surgeon: Erroll Luna, MD;  Location: [place];  Service: General;  Laterality: N/A;   UMBILICAL HERNIA REPAIR      Family History  Problem Relation Age of Onset   COPD Mother    Hypertension Mother    Emphysema Mother     Social History   Socioeconomic History   Marital status: Married    Spouse name: Rose    Number of children: 1   Years of education: college   Highest education level: Some college, no degree  Occupational History   Occupation: Retired     Comment: [employer]   Tobacco Use    Smoking status: Former    Types: Cigarettes    Quit date: 02/09/1980    Years since quitting: 41.5   Smokeless tobacco: Former  Scientific laboratory technician Use: Never used  Substance and Sexual Activity   Alcohol use: Yes    Comment: occasionally   Drug use: No   Sexual activity: Yes  Other Topics Concern   Not on file  Social History Narrative   Golf about 2 times per week.  No regular exercise.  . Member of [employer]   Social Determinants of Health   Financial Resource Strain: Not on file  Food Insecurity: Not on file  Transportation Needs: Not on file  Physical Activity: Not on file  Stress: Not on file  Social Connections: Not on file  Intimate Partner Violence: Not on file    Outpatient Medications Prior to Visit  Medication Sig Dispense Refill   finasteride (PROSCAR) 5 MG tablet Take 1 tablet (5 mg total) by mouth daily. 30 tablet 1   loratadine (CLARITIN) 10 MG tablet Take 10 mg by mouth daily as needed for allergies.  Misc. Devices MISC New bipap machine at current settings.     oxybutynin (DITROPAN-XL) 10 MG 24 hr tablet Take 10 mg by mouth daily.     pantoprazole (PROTONIX) 40 MG tablet TAKE 1 TABLET BY MOUTH EVERY DAY 90 tablet 1   simvastatin (ZOCOR) 40 MG tablet TAKE 1 TABLET (40 MG TOTAL) BY MOUTH DAILY AT 6 PM. 90 tablet 3   tamsulosin (FLOMAX) 0.4 MG CAPS capsule Take 0.4 mg by mouth daily.      triamcinolone cream (KENALOG) 0.1 % as needed.     valsartan-hydrochlorothiazide (DIOVAN-HCT) 80-12.5 MG tablet Take 1 tablet by mouth daily. 30 day supply given. Must schedule and keep appointment for future refills 30 tablet 0   cyclobenzaprine (FLEXERIL) 10 MG tablet Take 1 tablet (10 mg total) by mouth at bedtime as needed for muscle spasms. 30 tablet 0   fluocinonide (LIDEX) 0.05 % external solution Apply 1 application topically 2 (two) times daily as needed (DRY SKIN).     predniSONE (DELTASONE) 20 MG tablet Take 1 tablet (20 mg total) by mouth daily  with breakfast. 5 tablet 0   No facility-administered medications prior to visit.    Allergies  Allergen Reactions   Amoxicillin Swelling    Tongue sores   Iodinated Diagnostic Agents Shortness Of Breath and Rash    Patient gets hot and sweating   Lisinopril Cough and Rash    Other reaction(s): Cough    Review of Systems     Objective:    Physical Exam Constitutional:      Appearance: He is well-developed.  HENT:     Head: Normocephalic and atraumatic.  Cardiovascular:     Rate and Rhythm: Normal rate and regular rhythm.     Heart sounds: Normal heart sounds.  Pulmonary:     Effort: Pulmonary effort is normal.     Breath sounds: Normal breath sounds.  Musculoskeletal:     Comments: Second toe on his right foot is significantly bruised and swollen at the distal tip.  He is nontender at the metatarsal joint line and a little tender at the DIP joint.  dorsal pedal pulse 1+ on the right foot.  Trace swelling around the ankle and foot.  Was able to flex the toe.  Skin:    General: Skin is warm and dry.  Neurological:     Mental Status: He is alert and oriented to person, place, and time.  Psychiatric:        Behavior: Behavior normal.    BP (!) 158/70   Pulse 82   Ht 5\' 10"  (1.778 m)   Wt (!) 332 lb (150.6 kg)   SpO2 95%   BMI 47.64 kg/m  Wt Readings from Last 3 Encounters:  08/25/21 (!) 332 lb (150.6 kg)  04/05/21 (!) 332 lb (150.6 kg)  12/14/20 (!) 327 lb (148.3 kg)    Health Maintenance Due  Topic Date Due   FOOT EXAM  Never done   OPHTHALMOLOGY EXAM  Never done    There are no preventive care reminders to display for this patient.   Lab Results  Component Value Date   TSH 1.153 10/05/2015   Lab Results  Component Value Date   WBC 12.0 (H) 02/12/2019   HGB 15.2 02/12/2019   HCT 45.2 02/12/2019   MCV 85.1 02/12/2019   PLT 179 02/12/2019   Lab Results  Component Value Date   NA 137 09/14/2020   K 4.5 09/14/2020   CO2 26 09/14/2020  GLUCOSE  125 (H) 09/14/2020   BUN 19 09/14/2020   CREATININE 1.46 (H) 09/14/2020   BILITOT 0.9 09/14/2020   ALKPHOS 48 01/21/2018   AST 26 09/14/2020   ALT 36 09/14/2020   PROT 6.7 09/14/2020   ALBUMIN 3.7 01/21/2018   CALCIUM 9.5 09/14/2020   ANIONGAP 10 01/21/2018   Lab Results  Component Value Date   CHOL 128 09/14/2020   Lab Results  Component Value Date   HDL 31 (L) 09/14/2020   Lab Results  Component Value Date   LDLCALC 78 09/14/2020   Lab Results  Component Value Date   TRIG 107 09/14/2020   Lab Results  Component Value Date   CHOLHDL 4.1 09/14/2020   Lab Results  Component Value Date   HGBA1C 8.1 (A) 08/25/2021       Assessment & Plan:   Problem List Items Addressed This Visit       Cardiovascular and Mediastinum   Hypertension - Primary    Increase Diovan HCT to 120 mg we will continue with just the 12.5 mg HCTZ.  We discussed really making some changes with his diet and losing some weight which would help with his blood pressure as well and get a see him back in 6 weeks.      Relevant Medications   valsartan-hydrochlorothiazide (DIOVAN-HCT) 160-12.5 MG tablet   Other Relevant Orders   Lipid Panel w/reflex Direct LDL   COMPLETE METABOLIC PANEL WITH GFR     Endocrine   Controlled diabetes mellitus type 2 with complications (HCC)    G9E significantly elevated at 8.1 today up from previous of 6.7 admits he is slid back into some old habits as far as increased intake of sweets and carbs.  We discussed making some dietary changes he does seem motivated so we agreed to follow back up in about 6 weeks he will bring in his blood glucose log and we can take a look to see if we are making some significant improvements.  If not then we can discuss medication he is very hesitant about metformin so we did discuss the possibility of Ozempic or Trulicity.  Also encouraged him to work on increased activity level.      Relevant Medications   valsartan-hydrochlorothiazide  (DIOVAN-HCT) 160-12.5 MG tablet     Other   Hyperlipidemia   Relevant Medications   valsartan-hydrochlorothiazide (DIOVAN-HCT) 160-12.5 MG tablet   BMI 45.0-49.9, adult ([place])   Other Visit Diagnoses     Controlled type 2 diabetes mellitus without complication, without long-term current use of insulin (HCC)       Relevant Medications   valsartan-hydrochlorothiazide (DIOVAN-HCT) 160-12.5 MG tablet   Other Relevant Orders   POCT glycosylated hemoglobin (Hb A1C) (Completed)   Crushing injury of unspecified right toe(s), initial encounter       Relevant Orders   DG Toe 2nd Right   Screening for prostate cancer       Relevant Orders   PSA        Meds ordered this encounter  Medications   valsartan-hydrochlorothiazide (DIOVAN-HCT) 160-12.5 MG tablet    Sig: Take 1 tablet by mouth daily.    Dispense:  90 tablet    Refill:  1   I spent 40 minutes on the day of the encounter to include pre-visit record review, face-to-face time with the patient and post visit ordering of test.   Beatrice Lecher, MD

## 2021-08-29 NOTE — Progress Notes (Signed)
Hi Tracy Bennett, just wanted to let you know that the radiologist looked over the x-ray as well and did not see a fracture which is great hopefully this you is helping take some pressure off of the area and providing some relief.

## 2021-08-30 ENCOUNTER — Other Ambulatory Visit: Payer: Self-pay | Admitting: Family Medicine

## 2021-09-22 ENCOUNTER — Other Ambulatory Visit: Payer: Self-pay | Admitting: Family Medicine

## 2021-09-22 DIAGNOSIS — Z23 Encounter for immunization: Secondary | ICD-10-CM | POA: Diagnosis not present

## 2021-09-29 DIAGNOSIS — I10 Essential (primary) hypertension: Secondary | ICD-10-CM | POA: Diagnosis not present

## 2021-09-29 DIAGNOSIS — Z125 Encounter for screening for malignant neoplasm of prostate: Secondary | ICD-10-CM | POA: Diagnosis not present

## 2021-09-30 ENCOUNTER — Encounter: Payer: Self-pay | Admitting: Family Medicine

## 2021-09-30 LAB — COMPLETE METABOLIC PANEL WITH GFR
AG Ratio: 2 (calc) (ref 1.0–2.5)
ALT: 61 U/L — ABNORMAL HIGH (ref 9–46)
AST: 45 U/L — ABNORMAL HIGH (ref 10–35)
Albumin: 4.3 g/dL (ref 3.6–5.1)
Alkaline phosphatase (APISO): 49 U/L (ref 35–144)
BUN: 14 mg/dL (ref 7–25)
CO2: 25 mmol/L (ref 20–32)
Calcium: 9.2 mg/dL (ref 8.6–10.3)
Chloride: 103 mmol/L (ref 98–110)
Creat: 1.18 mg/dL (ref 0.70–1.28)
Globulin: 2.2 g/dL (calc) (ref 1.9–3.7)
Glucose, Bld: 138 mg/dL — ABNORMAL HIGH (ref 65–99)
Potassium: 3.7 mmol/L (ref 3.5–5.3)
Sodium: 137 mmol/L (ref 135–146)
Total Bilirubin: 0.9 mg/dL (ref 0.2–1.2)
Total Protein: 6.5 g/dL (ref 6.1–8.1)
eGFR: 64 mL/min/{1.73_m2} (ref >=60)

## 2021-09-30 LAB — LIPID PANEL W/REFLEX DIRECT LDL
Cholesterol: 119 mg/dL (ref <200)
HDL: 30 mg/dL — ABNORMAL LOW (ref >=40)
LDL Cholesterol (Calc): 69 mg/dL (calc)
Non-HDL Cholesterol (Calc): 89 mg/dL (calc) (ref <130)
Total CHOL/HDL Ratio: 4 (calc) (ref <5.0)
Triglycerides: 113 mg/dL (ref <150)

## 2021-09-30 LAB — PSA: PSA: 0.26 ng/mL (ref <=4.00)

## 2021-09-30 NOTE — Progress Notes (Signed)
Hi Tracy Bennett, LDL looks great at this time LDLs under 70 which is where we want to be which is great.  Kidney function also looks better.  In fact its the best its been in several years which is fantastic.  The only thing is that your liver function has jumped up again similar to a few years ago.  So just make sure you are really working on eating healthy, cutting back on processed foods and carbs.  Increase your vegetable and lean protein intake.  And then I like to recheck your liver function in about 4 weeks.  If it still elevated then we will need to do some additional work-up.  Prostate test is normal.  Also would like to have you come back in a couple weeks for nurse visit to recheck your blood pressure since it was high the last 2 times that you were here.  Also let us know if you have had a recent eye exam so that we can get your chart updated.

## 2021-09-30 NOTE — Telephone Encounter (Signed)
Pt has an appointment for 11/10 w/pcp for 6 wk follow up

## 2021-10-06 ENCOUNTER — Ambulatory Visit (INDEPENDENT_AMBULATORY_CARE_PROVIDER_SITE_OTHER): Payer: Medicare Other | Admitting: Family Medicine

## 2021-10-06 ENCOUNTER — Encounter: Payer: Self-pay | Admitting: Family Medicine

## 2021-10-06 ENCOUNTER — Other Ambulatory Visit: Payer: Self-pay

## 2021-10-06 VITALS — BP 133/60 | HR 67 | Ht 70.0 in | Wt 323.0 lb

## 2021-10-06 DIAGNOSIS — R748 Abnormal levels of other serum enzymes: Secondary | ICD-10-CM | POA: Insufficient documentation

## 2021-10-06 DIAGNOSIS — E118 Type 2 diabetes mellitus with unspecified complications: Secondary | ICD-10-CM

## 2021-10-06 DIAGNOSIS — R202 Paresthesia of skin: Secondary | ICD-10-CM | POA: Diagnosis not present

## 2021-10-06 NOTE — Progress Notes (Signed)
Established Patient Office Visit  Subjective:  Patient ID: Tracy Bennett, male    DOB: August 16, 1946  Age: 75 y.o. MRN: [id number]  CC:  Chief Complaint  Patient presents with   Hypertension     HPI Jairo Bellew presents for   Diabetes - no hypoglycemic events. No wounds or sores that are not healing well. No increased thirst or urination. Checking glucose at home. Taking medications as prescribed without any side effects.  He has really done a great job in cutting back on carbs and sweets.  He says its been difficult and challenging because he really loves his food but he has been able to do it.  He has been testing his blood sugars regularly and has been getting fastings around 140.  He did bring his glucometer with him today.  He is also lost about 12 pounds just by making some dietary changes he has noticed on days where he is out in the yard and doing yard work that his glucose was actually little better and even came down into the 120s.  His goal is to get this under control without taking medication.   Complains of a burning sensation just to the right of the umbilicus he has had mesh and surgical umbilical repair.  Its been coming and going a little bit he thought initially it was his belt buckle.  He has not noticed any rash.  He also had labs done last week and wanted to discuss his elevated liver enzymes.  Past Medical History:  Diagnosis Date   Community acquired pneumonia of left lower lobe of lung 05/15/2018   Dyspnea    with exertion   Dysrhythmia    frequent PVC's   Frequent urination    GERD (gastroesophageal reflux disease)    History of kidney stones    Hyperlipidemia    Hypertension    Kidney stones    x 4    Sleep apnea    BiPAP    Past Surgical History:  Procedure Laterality Date   CARPAL TUNNEL RELEASE     Right    CERVICAL FUSION     cervical fusion times 2   FOOT TENDON SURGERY  11/14/2011   Dr. Sharol Given   left knee surgery     ACL repair     LIPOMA EXCISION Bilateral    on back   MASS EXCISION N/A 01/24/2018   Procedure: EXCISION OF BACK MASS;  Surgeon: Erroll Luna, MD;  Location: [facility];  Service: General;  Laterality: N/A;   UMBILICAL HERNIA REPAIR      Family History  Problem Relation Age of Onset   COPD Mother    Hypertension Mother    Emphysema Mother     Social History   Socioeconomic History   Marital status: Married    Spouse name: Rose    Number of children: 1   Years of education: college   Highest education level: Some college, no degree  Occupational History   Occupation: Retired     Comment: [employer]   Tobacco Use   Smoking status: Former    Types: Cigarettes    Quit date: 02/09/1980    Years since quitting: 41.6   Smokeless tobacco: Former  Scientific laboratory technician Use: Never used  Substance and Sexual Activity   Alcohol use: Yes    Comment: occasionally   Drug use: No   Sexual activity: Yes  Other Topics Concern   Not on file  Social History  Narrative   Golf about 2 times per week.  No regular exercise.  . Member of [employer]   Social Determinants of Health   Financial Resource Strain: Not on file  Food Insecurity: Not on file  Transportation Needs: Not on file  Physical Activity: Not on file  Stress: Not on file  Social Connections: Not on file  Intimate Partner Violence: Not on file    Outpatient Medications Prior to Visit  Medication Sig Dispense Refill   finasteride (PROSCAR) 5 MG tablet Take 1 tablet (5 mg total) by mouth daily. 30 tablet 1   loratadine (CLARITIN) 10 MG tablet Take 10 mg by mouth daily as needed for allergies.      Misc. Devices MISC New bipap machine at current settings.     oxybutynin (DITROPAN-XL) 10 MG 24 hr tablet Take 10 mg by mouth daily.     pantoprazole (PROTONIX) 40 MG tablet TAKE 1 TABLET BY MOUTH EVERY DAY 90 tablet 3   simvastatin (ZOCOR) 40 MG tablet TAKE 1 TABLET (40 MG TOTAL) BY MOUTH DAILY AT 6 PM. 90 tablet 3   tamsulosin  (FLOMAX) 0.4 MG CAPS capsule Take 0.4 mg by mouth daily.      triamcinolone cream (KENALOG) 0.1 % as needed.     valsartan-hydrochlorothiazide (DIOVAN-HCT) 160-12.5 MG tablet Take 1 tablet by mouth daily. 90 tablet 1   No facility-administered medications prior to visit.    Allergies  Allergen Reactions   Amoxicillin Swelling    Tongue sores   Iodinated Diagnostic Agents Shortness Of Breath and Rash    Patient gets hot and sweating   Lisinopril Cough and Rash    Other reaction(s): Cough    ROS Review of Systems    Objective:    Physical Exam Constitutional:      Appearance: Normal appearance. He is well-developed.  HENT:     Head: Normocephalic and atraumatic.  Cardiovascular:     Rate and Rhythm: Normal rate and regular rhythm.     Heart sounds: Normal heart sounds.  Pulmonary:     Effort: Pulmonary effort is normal.     Breath sounds: Normal breath sounds.  Skin:    General: Skin is warm and dry.  Neurological:     Mental Status: He is alert and oriented to person, place, and time. Mental status is at baseline.  Psychiatric:        Behavior: Behavior normal.    BP 133/60   Pulse 67   Ht '5\' 10"'  (1.778 m)   Wt (!) 323 lb (146.5 kg)   SpO2 97%   BMI 46.35 kg/m  Wt Readings from Last 3 Encounters:  10/06/21 (!) 323 lb (146.5 kg)  08/25/21 (!) 332 lb (150.6 kg)  04/05/21 (!) 332 lb (150.6 kg)     Health Maintenance Due  Topic Date Due   FOOT EXAM  Never done   COVID-19 Vaccine (5 - Booster for Pfizer series) 07/12/2021    There are no preventive care reminders to display for this patient.  Lab Results  Component Value Date   TSH 1.153 10/05/2015   Lab Results  Component Value Date   WBC 12.0 (H) 02/12/2019   HGB 15.2 02/12/2019   HCT 45.2 02/12/2019   MCV 85.1 02/12/2019   PLT 179 02/12/2019   Lab Results  Component Value Date   NA 137 09/29/2021   K 3.7 09/29/2021   CO2 25 09/29/2021   GLUCOSE 138 (H) 09/29/2021   BUN 14  09/29/2021    CREATININE 1.18 09/29/2021   BILITOT 0.9 09/29/2021   ALKPHOS 48 01/21/2018   AST 45 (H) 09/29/2021   ALT 61 (H) 09/29/2021   PROT 6.5 09/29/2021   ALBUMIN 3.7 01/21/2018   CALCIUM 9.2 09/29/2021   ANIONGAP 10 01/21/2018   EGFR 64 09/29/2021   Lab Results  Component Value Date   CHOL 119 09/29/2021   Lab Results  Component Value Date   HDL 30 (L) 09/29/2021   Lab Results  Component Value Date   LDLCALC 69 09/29/2021   Lab Results  Component Value Date   TRIG 113 09/29/2021   Lab Results  Component Value Date   CHOLHDL 4.0 09/29/2021   Lab Results  Component Value Date   HGBA1C 8.1 (A) 08/25/2021      Assessment & Plan:   Problem List Items Addressed This Visit       Endocrine   Controlled diabetes mellitus type 2 with complications ([place]) - Primary    He did bring in his glucometer today and is actually doing really well he is got his blood sugars down from 180 range to 1 20-1 40 range he is really making fantastic progress and just encouraged him to keep it up add Love to see him get his weight down under 300 pounds I think would make a huge difference in his insulin resistance and we could hopefully keep him off of medication I will see him back in about 6 to 7 weeks to check that next A1c hopefully its under 7.  Again if not we can consider medication at that point.  Also work on increasing daily activity and exercise level we discussed how that can lower blood sugars as well.        Other   Severe obesity (BMI >= 40) (HCC)    He is lost 12 pounds which is fantastic just encouraged him to continue to work at it.      Elevated liver enzymes    Discussed elevated lipids and liver enzymes often come from elevated glucose levels and dietary indiscretion.  I suspect that it probably bit better once we check it next month.  But if it still elevated then we will do additional work-up.      Other Visit Diagnoses     Paresthesia of skin           Burning  sensation/paresthesia to the right of the umbilicus.  I did not palpate any new hernia or abdominal wall defect I did not palpate any mass on exam.  No rash.  But did encourage him to just keep an eye on it and let me know if it is worsening or if he starts to notice a bulge.  No orders of the defined types were placed in this encounter.   Follow-up: Return in about 7 weeks (around 11/24/2021) for Diabetes follow-up.    Beatrice Lecher, MD

## 2021-10-06 NOTE — Assessment & Plan Note (Signed)
Discussed elevated lipids and liver enzymes often come from elevated glucose levels and dietary indiscretion.  I suspect that it probably bit better once we check it next month.  But if it still elevated then we will do additional work-up.

## 2021-10-06 NOTE — Progress Notes (Signed)
Pt brought in his glucose meter averages are as follows:  7=   144 14= 147 30= 150 90= 156

## 2021-10-06 NOTE — Assessment & Plan Note (Signed)
He is lost 12 pounds which is fantastic just encouraged him to continue to work at it.

## 2021-10-06 NOTE — Assessment & Plan Note (Signed)
He did bring in his glucometer today and is actually doing really well he is got his blood sugars down from 180 range to 1 20-1 40 range he is really making fantastic progress and just encouraged him to keep it up add Love to see him get his weight down under 300 pounds I think would make a huge difference in his insulin resistance and we could hopefully keep him off of medication I will see him back in about 6 to 7 weeks to check that next A1c hopefully its under 7.  Again if not we can consider medication at that point.  Also work on increasing daily activity and exercise level we discussed how that can lower blood sugars as well.

## 2021-10-07 ENCOUNTER — Encounter: Payer: Self-pay | Admitting: Family Medicine

## 2021-10-07 MED ORDER — ACCU-CHEK GUIDE VI STRP: ORAL_STRIP | 11 refills | Status: DC

## 2021-10-07 NOTE — Addendum Note (Signed)
Addended by: Teddy Spike on: 10/07/2021 12:26 PM   Modules accepted: Orders

## 2021-10-21 ENCOUNTER — Emergency Department (HOSPITAL_BASED_OUTPATIENT_CLINIC_OR_DEPARTMENT_OTHER): Payer: Medicare Other

## 2021-10-21 ENCOUNTER — Emergency Department (HOSPITAL_BASED_OUTPATIENT_CLINIC_OR_DEPARTMENT_OTHER)
Admission: EM | Admit: 2021-10-21 | Discharge: 2021-10-21 | Disposition: A | Discharge status: Home / Self Care | Payer: Medicare Other | Attending: Emergency Medicine | Admitting: Emergency Medicine

## 2021-10-21 ENCOUNTER — Other Ambulatory Visit: Payer: Self-pay

## 2021-10-21 ENCOUNTER — Encounter (HOSPITAL_BASED_OUTPATIENT_CLINIC_OR_DEPARTMENT_OTHER): Payer: Self-pay | Admitting: *Deleted

## 2021-10-21 DIAGNOSIS — N183 Chronic kidney disease, stage 3 unspecified: Secondary | ICD-10-CM | POA: Insufficient documentation

## 2021-10-21 DIAGNOSIS — I129 Hypertensive chronic kidney disease with stage 1 through stage 4 chronic kidney disease, or unspecified chronic kidney disease: Secondary | ICD-10-CM | POA: Insufficient documentation

## 2021-10-21 DIAGNOSIS — Z79899 Other long term (current) drug therapy: Secondary | ICD-10-CM | POA: Insufficient documentation

## 2021-10-21 DIAGNOSIS — E1122 Type 2 diabetes mellitus with diabetic chronic kidney disease: Secondary | ICD-10-CM | POA: Insufficient documentation

## 2021-10-21 DIAGNOSIS — Z87891 Personal history of nicotine dependence: Secondary | ICD-10-CM | POA: Diagnosis not present

## 2021-10-21 DIAGNOSIS — S0990XA Unspecified injury of head, initial encounter: Secondary | ICD-10-CM | POA: Diagnosis not present

## 2021-10-21 DIAGNOSIS — W228XXA Striking against or struck by other objects, initial encounter: Secondary | ICD-10-CM | POA: Insufficient documentation

## 2021-10-21 NOTE — Discharge Instructions (Addendum)
Please continue to take care of her wound apply Neosporin and keeping it clean and dry.  You may use the wound care supplies given to you in order to keep this covered while using your CPAP machine at home.  If you experience a headache, worsening symptoms you will need to return to emergency department.

## 2021-10-21 NOTE — ED Provider Notes (Signed)
[facility] EMERGENCY DEPARTMENT Provider Note   CSN: [id number] Arrival date & time: 10/21/21  1115     History Chief Complaint  Patient presents with   Abrasion    Tracy Bennett is a 75 y.o. male.  74 y.o male with a PMH of HTN, GERD, sinus bradycardia presents to the ED with a chief complaint of head injury x2 yesterday.  Patient reports he was trying to feed his chickens, when excellently he did not duck further enough and hit the top of the chicken coop door.  He reports at the time of incident there was significant blood noted to the area, he did not lose consciousness after this happened and it was witnessed by wife at the bedside. She has been cleaning the wound with soap and water.  She also has been applying Neosporin to it.  Does report the wound did get irritated last night as he was sleeping with his CPAP machine.  Last tetanus immunization within the last couple of years.  No loss of consciousness, currently on no blood thinners.  The history is provided by the patient and medical records.      Past Medical History:  Diagnosis Date   Community acquired pneumonia of left lower lobe of lung 05/15/2018   Dyspnea    with exertion   Dysrhythmia    frequent PVC's   Frequent urination    GERD (gastroesophageal reflux disease)    History of kidney stones    Hyperlipidemia    Hypertension    Kidney stones    x 4    Sleep apnea    BiPAP    Patient Active Problem List   Diagnosis Date Noted   Elevated liver enzymes 10/06/2021   Palpitations-RVOT 03/24/2021   (HFpEF) heart failure with preserved ejection fraction ([place]) 03/24/2021   Sinus bradycardia 03/24/2021   BMI 45.0-49.9, adult ([place]) 02/12/2019   S/P YAG capsulotomy 09/18/2018   Bilateral dry eyes 08/14/2018   Aortic atherosclerosis ([place]) 05/20/2018   Brow ptosis 10/02/2016   Dermatochalasis of both upper eyelids 10/02/2016   CKD (chronic kidney disease) stage 3, GFR 30-59 ml/min (HCC) 03/29/2016    Obesity, Class III, BMI 40-49.9 (morbid obesity) ([place]) 08/27/2015   OAB (overactive bladder) 42/59/5638   Mild diastolic dysfunction 75/64/3329   LVH (left ventricular hypertrophy) due to hypertensive disease 11/23/2014   Cataract extraction status, unspecified eye 10/20/2014   OSA treated with BiPAP 09/01/2014   Controlled diabetes mellitus type 2 with complications ([place]) 51/88/4166   Severe obesity (BMI >= 40) ([place]) 01/29/2014   Obesity 02/09/2012   Hypertension 10/12/2011   GERD (gastroesophageal reflux disease) 10/12/2011   Hyperlipidemia 10/12/2011    Past Surgical History:  Procedure Laterality Date   CARPAL TUNNEL RELEASE     Right    CERVICAL FUSION     cervical fusion times 2   FOOT TENDON SURGERY  11/14/2011   Dr. Sharol Given   left knee surgery     ACL repair    LIPOMA EXCISION Bilateral    on back   MASS EXCISION N/A 01/24/2018   Procedure: EXCISION OF BACK MASS;  Surgeon: Erroll Luna, MD;  Location: [facility] OR;  Service: General;  Laterality: N/A;   UMBILICAL HERNIA REPAIR         Family History  Problem Relation Age of Onset   COPD Mother    Hypertension Mother    Emphysema Mother     Social History   Tobacco Use   Smoking status:  Former    Types: Cigarettes    Quit date: 02/09/1980    Years since quitting: 41.7   Smokeless tobacco: Former  Scientific laboratory technician Use: Never used  Substance Use Topics   Alcohol use: Yes    Comment: occasionally   Drug use: No    Home Medications Prior to Admission medications   Medication Sig Start Date End Date Taking? Authorizing Provider  finasteride (PROSCAR) 5 MG tablet Take 1 tablet (5 mg total) by mouth daily. 05/13/14   Hali Marry, MD  glucose blood (ACCU-CHEK GUIDE) test strip Check fasting blood sugar every morning. Dx: E11.9 10/07/21   Hali Marry, MD  loratadine (CLARITIN) 10 MG tablet Take 10 mg by mouth daily as needed for allergies.     [provider]  Misc. Devices MISC New  bipap machine at current settings. 06/16/21   [provider]  oxybutynin (DITROPAN-XL) 10 MG 24 hr tablet Take 10 mg by mouth daily. 12/03/20 12/14/21  Gwendel Hanson, MD  pantoprazole (PROTONIX) 40 MG tablet TAKE 1 TABLET BY MOUTH EVERY DAY 09/22/21   Hali Marry, MD  simvastatin (ZOCOR) 40 MG tablet TAKE 1 TABLET (40 MG TOTAL) BY MOUTH DAILY AT 6 PM. 07/13/21   Hali Marry, MD  tamsulosin (FLOMAX) 0.4 MG CAPS capsule Take 0.4 mg by mouth daily.     [provider]  triamcinolone cream (KENALOG) 0.1 % as needed. 11/10/20   [provider]  valsartan-hydrochlorothiazide (DIOVAN-HCT) 160-12.5 MG tablet Take 1 tablet by mouth daily. 08/25/21   Hali Marry, MD    Allergies    Amoxicillin, Iodinated diagnostic agents, and Lisinopril  Review of Systems   Review of Systems  Constitutional:  Negative for fever.  Respiratory:  Negative for shortness of breath.   Cardiovascular:  Negative for chest pain.  Genitourinary:  Negative for flank pain.  Skin:  Positive for wound.  Neurological:  Negative for headaches.   Physical Exam Updated Vital Signs BP (!) 160/78 (BP Location: Right Arm)   Pulse (!) 58   Temp 97.7 F (36.5 C) (Oral)   Resp 18   Ht 5\' 10"  (1.778 m)   Wt (!) 146.5 kg   SpO2 94%   BMI 46.34 kg/m   Physical Exam Vitals and nursing note reviewed.  Constitutional:      Appearance: Normal appearance.  HENT:     Head: Normocephalic and atraumatic.     Nose: Nose normal.  Eyes:     Pupils: Pupils are equal, round, and reactive to light.  Cardiovascular:     Rate and Rhythm: Normal rate.  Pulmonary:     Effort: Pulmonary effort is normal.  Abdominal:     General: Abdomen is flat.  Musculoskeletal:     Cervical back: Normal range of motion and neck supple.  Skin:    General: Skin is warm and dry.  Neurological:     Mental Status: He is alert and oriented to person, place, and time.     Comments: Alert,  oriented, thought content appropriate. Speech fluent without evidence of aphasia. Able to follow 2 step commands without difficulty.  Cranial Nerves:  II:  Peripheral visual fields grossly normal, pupils, round, reactive to light III,IV, VI: ptosis not present, extra-ocular motions intact bilaterally  V,VII: smile symmetric, facial light touch sensation equal VIII: hearing grossly normal bilaterally  IX,X: midline uvula rise  XI: bilateral shoulder shrug equal and strong XII: midline tongue extension  Motor:  5/5 in upper and lower extremities bilaterally including strong and equal grip strength and dorsiflexion/plantar flexion Sensory: light touch normal in all extremities.  Cerebellar: normal finger-to-nose with bilateral upper extremities, pronator drift negative Gait: normal gait and balance      ED Results / Procedures / Treatments   Labs (all labs ordered are listed, but only abnormal results are displayed) Labs Reviewed - No data to display  EKG None  Radiology No results found.  Procedures Procedures   Medications Ordered in ED Medications - No data to display  ED Course  I have reviewed the triage vital signs and the nursing notes.  Pertinent labs & imaging results that were available during my care of the patient were reviewed by me and considered in my medical decision making (see chart for details).    MDM Rules/Calculators/A&P   Patient presents to the ED status post head injury yesterday.  Reports he was going into his chicken coop when he suddenly scraped the top of his head.  Reports a goose egg was noted at the area.  Last tetanus immunization is within last few years.  During evaluation is overall neurologically intact, denies any headache, denies any loss of consciousness, currently on no blood thinners.  Vitals are within normal limits, goals are equal and reactive, full neuro exam is within normal limits.  We did discuss risk and benefits of  obtaining imaging at this time, as patient has been asymptomatic in the last day.  We will forego CT imaging at this time. I have discussed case with my attending Dr. Roslynn Amble who is also seen patient and agrees with plan and management.  Patient understands and agrees to management, to precautions gust at length.  Patient stable for discharge.  Portions of this note were generated with [employer]. Dictation errors may occur despite best attempts at proofreading.  Final Clinical Impression(s) / ED Diagnoses Final diagnoses:  Injury of head, initial encounter    Rx / DC Orders ED Discharge Orders     None        Janeece Fitting, PA-C 10/21/21 1339    Lucrezia Starch, MD 10/21/21 1528

## 2021-10-21 NOTE — ED Triage Notes (Signed)
Abrasion to the top of his head. He hit the top of his head on wooden chicken coop door frame. No loc. Alert oriented. No blood thinners.

## 2021-10-25 DIAGNOSIS — H6123 Impacted cerumen, bilateral: Secondary | ICD-10-CM | POA: Diagnosis not present

## 2021-11-01 DIAGNOSIS — L280 Lichen simplex chronicus: Secondary | ICD-10-CM | POA: Diagnosis not present

## 2021-11-01 DIAGNOSIS — D485 Neoplasm of uncertain behavior of skin: Secondary | ICD-10-CM | POA: Diagnosis not present

## 2021-11-01 DIAGNOSIS — Z86008 Personal history of in-situ neoplasm of other site: Secondary | ICD-10-CM | POA: Diagnosis not present

## 2021-11-01 DIAGNOSIS — L570 Actinic keratosis: Secondary | ICD-10-CM | POA: Diagnosis not present

## 2021-11-24 ENCOUNTER — Other Ambulatory Visit: Payer: Self-pay

## 2021-11-24 ENCOUNTER — Encounter: Payer: Self-pay | Admitting: Family Medicine

## 2021-11-24 ENCOUNTER — Ambulatory Visit (INDEPENDENT_AMBULATORY_CARE_PROVIDER_SITE_OTHER): Payer: Medicare Other | Admitting: Family Medicine

## 2021-11-24 VITALS — BP 132/59 | HR 65 | Temp 98.1°F | Resp 18 | Ht 69.0 in | Wt 322.0 lb

## 2021-11-24 DIAGNOSIS — E118 Type 2 diabetes mellitus with unspecified complications: Secondary | ICD-10-CM | POA: Diagnosis not present

## 2021-11-24 DIAGNOSIS — M545 Low back pain, unspecified: Secondary | ICD-10-CM | POA: Diagnosis not present

## 2021-11-24 DIAGNOSIS — N1831 Chronic kidney disease, stage 3a: Secondary | ICD-10-CM | POA: Diagnosis not present

## 2021-11-24 DIAGNOSIS — I10 Essential (primary) hypertension: Secondary | ICD-10-CM | POA: Diagnosis not present

## 2021-11-24 LAB — POCT GLYCOSYLATED HEMOGLOBIN (HGB A1C): Hemoglobin A1C: 7 % — AB (ref 4.0–5.6)

## 2021-11-24 MED ORDER — METHOCARBAMOL 500 MG PO TABS: 500.0000 mg | ORAL_TABLET | Freq: Every evening | ORAL | 0 refills | Status: DC | PRN

## 2021-11-24 NOTE — Assessment & Plan Note (Signed)
Repeat blood pressure looks much better.  We will continue to monitor.

## 2021-11-24 NOTE — Progress Notes (Signed)
Pt brought his glucose meter in  7    day 144 14  day 145 30  day 143 90  day 149

## 2021-11-24 NOTE — Progress Notes (Signed)
Established Patient Office Visit  Subjective:  Patient ID: Tracy Bennett, male    DOB: Oct 14, 1946  Age: 75 y.o. MRN: [id number]  CC:  Chief Complaint  Patient presents with   Diabetes    Follow up    Back Pain    Spasm/flare up after bending down on 11/19/21. Patient currently taking Meloxicam and is requesting for a Muscle Relaxant      HPI Tracy Bennett presents for   Diabetes - no hypoglycemic events. No wounds or sores that are not healing well. No increased thirst or urination. Checking glucose at home. Taking medications as prescribed without any side effects.  When I last saw him he had really made some major dietary changes and cut back.  He lost about 12 pounds and was really getting on track with lifestyle changes.  Reports fasting blood sugars have been between 120 and 150.  7-day average was 137, 14-day average average was 140.  Complains of back pain. Spasm/flare up after bending down on 11/19/21. Patient currently taking Meloxicam and is requesting for a Muscle Relaxant.  He says the meloxicam has not really been helping so he is mostly been using Tylenol and ice.  He has had this happen multiple times before and feels like it was triggered after he had been working on his well that unfortunately froze during the recent cold weather.   Lab Results  Component Value Date   HGBA1C 7.0 (A) 11/24/2021     Past Medical History:  Diagnosis Date   Community acquired pneumonia of left lower lobe of lung 05/15/2018   Dyspnea    with exertion   Dysrhythmia    frequent PVC's   Frequent urination    GERD (gastroesophageal reflux disease)    History of kidney stones    Hyperlipidemia    Hypertension    Kidney stones    x 4    Sleep apnea    BiPAP    Past Surgical History:  Procedure Laterality Date   CARPAL TUNNEL RELEASE     Right    CERVICAL FUSION     cervical fusion times 2   FOOT TENDON SURGERY  11/14/2011   Dr. Sharol Given   left knee surgery     ACL repair     LIPOMA EXCISION Bilateral    on back   MASS EXCISION N/A 01/24/2018   Procedure: EXCISION OF BACK MASS;  Surgeon: Erroll Luna, MD;  Location: [facility];  Service: General;  Laterality: N/A;   UMBILICAL HERNIA REPAIR      Family History  Problem Relation Age of Onset   COPD Mother    Hypertension Mother    Emphysema Mother     Social History   Socioeconomic History   Marital status: Married    Spouse name: Rose    Number of children: 1   Years of education: college   Highest education level: Some college, no degree  Occupational History   Occupation: Retired     Comment: [employer]   Tobacco Use   Smoking status: Former    Types: Cigarettes    Quit date: 02/09/1980    Years since quitting: 41.8   Smokeless tobacco: Former  Scientific laboratory technician Use: Never used  Substance and Sexual Activity   Alcohol use: Yes    Comment: occasionally   Drug use: No   Sexual activity: Yes  Other Topics Concern   Not on file  Social History Narrative   Golf about  2 times per week.  No regular exercise.  . Member of [employer]   Social Determinants of Health   Financial Resource Strain: Not on file  Food Insecurity: Not on file  Transportation Needs: Not on file  Physical Activity: Not on file  Stress: Not on file  Social Connections: Not on file  Intimate Partner Violence: Not on file    Outpatient Medications Prior to Visit  Medication Sig Dispense Refill   finasteride (PROSCAR) 5 MG tablet Take 1 tablet (5 mg total) by mouth daily. 30 tablet 1   glucose blood (ACCU-CHEK GUIDE) test strip Check fasting blood sugar every morning. Dx: E11.9 100 each 11   loratadine (CLARITIN) 10 MG tablet Take 10 mg by mouth daily as needed for allergies.      Misc. Devices MISC New bipap machine at current settings.     oxybutynin (DITROPAN-XL) 10 MG 24 hr tablet Take 10 mg by mouth daily.     pantoprazole (PROTONIX) 40 MG tablet TAKE 1 TABLET BY MOUTH EVERY DAY 90 tablet 3    simvastatin (ZOCOR) 40 MG tablet TAKE 1 TABLET (40 MG TOTAL) BY MOUTH DAILY AT 6 PM. 90 tablet 3   tamsulosin (FLOMAX) 0.4 MG CAPS capsule Take 0.4 mg by mouth daily.      valsartan-hydrochlorothiazide (DIOVAN-HCT) 160-12.5 MG tablet Take 1 tablet by mouth daily. 90 tablet 1   triamcinolone cream (KENALOG) 0.1 % as needed. (Patient not taking: Reported on 11/24/2021)     No facility-administered medications prior to visit.    Allergies  Allergen Reactions   Amoxicillin Swelling    Tongue sores   Iodinated Contrast Media Shortness Of Breath and Rash    Patient gets hot and sweating   Lisinopril Cough and Rash    Other reaction(s): Cough    ROS Review of Systems    Objective:    Physical Exam Constitutional:      Appearance: Normal appearance. He is well-developed.  HENT:     Head: Normocephalic and atraumatic.  Cardiovascular:     Rate and Rhythm: Normal rate and regular rhythm.     Heart sounds: Normal heart sounds.  Pulmonary:     Effort: Pulmonary effort is normal.     Breath sounds: Normal breath sounds.  Skin:    General: Skin is warm and dry.  Neurological:     Mental Status: He is alert and oriented to person, place, and time. Mental status is at baseline.  Psychiatric:        Behavior: Behavior normal.    BP (!) 132/59    Pulse 65    Temp 98.1 F (36.7 C)    Resp 18    Ht _0  (1.753 m)    Wt (!) 322 lb (146.1 kg)    SpO2 94%    BMI 47.55 kg/m  Wt Readings from Last 3 Encounters:  11/24/21 (!) 322 lb (146.1 kg)  10/21/21 (!) 322 lb 15.6 oz (146.5 kg)  10/06/21 (!) 323 lb (146.5 kg)     Health Maintenance Due  Topic Date Due   FOOT EXAM  Never done    There are no preventive care reminders to display for this patient.  Lab Results  Component Value Date   TSH 1.153 10/05/2015   Lab Results  Component Value Date   WBC 12.0 (H) 02/12/2019   HGB 15.2 02/12/2019   HCT 45.2 02/12/2019   MCV 85.1 02/12/2019   PLT 179 02/12/2019   Lab  Results   Component Value Date   NA 137 09/29/2021   K 3.7 09/29/2021   CO2 25 09/29/2021   GLUCOSE 138 (H) 09/29/2021   BUN 14 09/29/2021   CREATININE 1.18 09/29/2021   BILITOT 0.9 09/29/2021   ALKPHOS 48 01/21/2018   AST 45 (H) 09/29/2021   ALT 61 (H) 09/29/2021   PROT 6.5 09/29/2021   ALBUMIN 3.7 01/21/2018   CALCIUM 9.2 09/29/2021   ANIONGAP 10 01/21/2018   EGFR 64 09/29/2021   Lab Results  Component Value Date   CHOL 119 09/29/2021   Lab Results  Component Value Date   HDL 30 (L) 09/29/2021   Lab Results  Component Value Date   LDLCALC 69 09/29/2021   Lab Results  Component Value Date   TRIG 113 09/29/2021   Lab Results  Component Value Date   CHOLHDL 4.0 09/29/2021   Lab Results  Component Value Date   HGBA1C 7.0 (A) 11/24/2021      Assessment & Plan:   Problem List Items Addressed This Visit       Cardiovascular and Mediastinum   Hypertension    Repeat blood pressure looks much better.  We will continue to monitor.        Endocrine   Controlled diabetes mellitus type 2 with complications ([place]) - Primary    A1C did come down to 7.0 from 8.1 which is good progress.  He is really made some great changes and has lost about 13 pounds.  I just encouraged him to continue to work on some of those changes I think now that the holidays are over it will be a little bit easier to be consistent.  Continue to work on portion control.  We discussed continuing with the strategy for at least another 30 days if his 14-day glucose average is closer to 130 then we can continue to just work on lifestyle changes if it staying elevated closer to 140 then I really would like to add low-dose metformin.  He has been very hesitant to start metformin because of the potential side effects.  But would be willing to start it if we feel it is necessary.      Relevant Orders   POCT glycosylated hemoglobin (Hb A1C) (Completed)     Genitourinary   CKD (chronic kidney disease) stage 3, GFR  30-59 ml/min (HCC)    Continue to follow renal function every 6 months.        Other   Obesity, Class III, BMI 40-49.9 (morbid obesity) ([place])    He really has done a fantastic job and has lost about 13 pounds in the last 3 months.  Just really encouraged him to continue to work on making those changes and increasing his activity level.  I would be thrilled if he could lose about another 3-4  pounds between now and when I see him back in 8 weeks.      Other Visit Diagnoses     Acute right-sided low back pain without sciatica       Relevant Medications   methocarbamol (ROBAXIN) 500 MG tablet       Acute right-sided low back pain-he has had this happen multiple times before he will get some spasms.  It was definitely triggered by working on his well house when the pipes froze.  Continue with Tylenol, ice and gentle stretches.  Sent over prescription for muscle relaxer to use.  Meds ordered this encounter  Medications   methocarbamol (ROBAXIN) 500 MG  tablet    Sig: Take 1 tablet (500 mg total) by mouth at bedtime as needed for muscle spasms.    Dispense:  30 tablet    Refill:  0    Follow-up: Return in about 3 months (around 02/22/2022) for DM/bp.   I spent 32 minutes on the day of the encounter to include pre-visit record review, face-to-face time with the patient and post visit ordering of test.   Beatrice Lecher, MD

## 2021-11-24 NOTE — Assessment & Plan Note (Signed)
He really has done a fantastic job and has lost about 13 pounds in the last 3 months.  Just really encouraged him to continue to work on making those changes and increasing his activity level.  I would be thrilled if he could lose about another 3-4  pounds between now and when I see him back in 8 weeks.

## 2021-11-24 NOTE — Assessment & Plan Note (Addendum)
A1C did come down to 7.0 from 8.1 which is good progress.  He is really made some great changes and has lost about 13 pounds.  I just encouraged him to continue to work on some of those changes I think now that the holidays are over it will be a little bit easier to be consistent.  Continue to work on portion control.  We discussed continuing with the strategy for at least another 30 days if his 14-day glucose average is closer to 130 then we can continue to just work on lifestyle changes if it staying elevated closer to 140 then I really would like to add low-dose metformin.  He has been very hesitant to start metformin because of the potential side effects.  But would be willing to start it if we feel it is necessary.

## 2021-11-24 NOTE — Assessment & Plan Note (Signed)
Continue to follow renal function every 6 months. 

## 2021-12-20 ENCOUNTER — Encounter: Payer: Self-pay | Admitting: Family Medicine

## 2021-12-21 NOTE — Telephone Encounter (Signed)
We would use metformin ER 500mg  once a day.  We can send to pharmacy and have them run it for cost.  It is generic so should relatively inexpensive.

## 2021-12-23 MED ORDER — METFORMIN HCL ER 500 MG PO TB24: 500.0000 mg | ORAL_TABLET | Freq: Every day | ORAL | 0 refills | Status: DC

## 2021-12-23 NOTE — Telephone Encounter (Signed)
Meds ordered this encounter  Medications   metFORMIN (GLUCOPHAGE-XR) 500 MG 24 hr tablet    Sig: Take 1 tablet (500 mg total) by mouth daily with breakfast.    Dispense:  90 tablet    Refill:  0

## 2022-01-09 ENCOUNTER — Other Ambulatory Visit: Payer: Self-pay | Admitting: Family Medicine

## 2022-01-09 NOTE — Telephone Encounter (Signed)
[facility] pharmacy requesting med refill for Accu-chek fastclix lancet drum. Rx not listed in active med list.

## 2022-01-11 ENCOUNTER — Other Ambulatory Visit: Payer: Self-pay | Admitting: Family Medicine

## 2022-01-11 ENCOUNTER — Telehealth: Payer: Self-pay

## 2022-01-11 NOTE — Telephone Encounter (Signed)
Called in patients Accu-chek fastclix lancets to pharmacy.

## 2022-01-14 ENCOUNTER — Encounter: Payer: Self-pay | Admitting: Family Medicine

## 2022-01-16 MED ORDER — ACCU-CHEK FASTCLIX LANCETS MISC: 11 refills | Status: DC

## 2022-01-26 ENCOUNTER — Other Ambulatory Visit: Payer: Self-pay | Admitting: *Deleted

## 2022-01-26 DIAGNOSIS — E118 Type 2 diabetes mellitus with unspecified complications: Secondary | ICD-10-CM

## 2022-01-26 MED ORDER — ACCU-CHEK FASTCLIX LANCETS MISC: 11 refills | Status: DC

## 2022-02-12 ENCOUNTER — Other Ambulatory Visit: Payer: Self-pay | Admitting: Family Medicine

## 2022-02-12 DIAGNOSIS — I10 Essential (primary) hypertension: Secondary | ICD-10-CM

## 2022-02-21 ENCOUNTER — Telehealth: Payer: Self-pay | Admitting: Family Medicine

## 2022-02-21 ENCOUNTER — Ambulatory Visit (INDEPENDENT_AMBULATORY_CARE_PROVIDER_SITE_OTHER): Payer: Medicare Other | Admitting: Family Medicine

## 2022-02-21 ENCOUNTER — Other Ambulatory Visit: Payer: Self-pay

## 2022-02-21 ENCOUNTER — Encounter: Payer: Self-pay | Admitting: Family Medicine

## 2022-02-21 VITALS — BP 130/56 | HR 72 | Resp 18 | Ht 69.0 in | Wt 325.0 lb

## 2022-02-21 DIAGNOSIS — Z6841 Body Mass Index (BMI) 40.0 and over, adult: Secondary | ICD-10-CM | POA: Diagnosis not present

## 2022-02-21 DIAGNOSIS — I700 Atherosclerosis of aorta: Secondary | ICD-10-CM

## 2022-02-21 DIAGNOSIS — I10 Essential (primary) hypertension: Secondary | ICD-10-CM | POA: Diagnosis not present

## 2022-02-21 DIAGNOSIS — I5030 Unspecified diastolic (congestive) heart failure: Secondary | ICD-10-CM | POA: Diagnosis not present

## 2022-02-21 DIAGNOSIS — E118 Type 2 diabetes mellitus with unspecified complications: Secondary | ICD-10-CM | POA: Diagnosis not present

## 2022-02-21 LAB — POCT GLYCOSYLATED HEMOGLOBIN (HGB A1C): Hemoglobin A1C: 7.1 % — AB (ref 4.0–5.6)

## 2022-02-21 NOTE — Progress Notes (Signed)
? ?Established Patient Office Visit ? ?Subjective:  ?Patient ID: Tracy Bennett, male    DOB: 07/14/1946  Age: 76 y.o. MRN: [id number] ? ?CC:  ?Chief Complaint  ?Patient presents with  ? Diabetes  ?  Follow up   ? Hypertension  ?  Follow up   ? ? ?HPI ?Tracy Bennett presents for  ? ?Hypertension- Pt denies chest pain, SOB, dizziness, or heart palpitations.  Taking meds as directed w/o problems.  Denies medication side effects.   ? ?Diabetes - no hypoglycemic events. No wounds or sores that are not healing well. No increased thirst or urination. Checking glucose at home. Taking medications as prescribed without any side effects. ? ? ?Past Medical History:  ?Diagnosis Date  ? Community acquired pneumonia of left lower lobe of lung 05/15/2018  ? Dyspnea   ? with exertion  ? Dysrhythmia   ? frequent PVC's  ? Frequent urination   ? GERD (gastroesophageal reflux disease)   ? History of kidney stones   ? Hyperlipidemia   ? Hypertension   ? Kidney stones   ? x 4   ? Sleep apnea   ? BiPAP  ? ? ?Past Surgical History:  ?Procedure Laterality Date  ? CARPAL TUNNEL RELEASE    ? Right   ? CERVICAL FUSION    ? cervical fusion times 2  ? FOOT TENDON SURGERY  11/14/2011  ? Dr. Sharol Given  ? left knee surgery    ? ACL repair   ? LIPOMA EXCISION Bilateral   ? on back  ? MASS EXCISION N/A 01/24/2018  ? Procedure: EXCISION OF BACK MASS;  Surgeon: Erroll Luna, MD;  Location: [facility];  Service: General;  Laterality: N/A;  ? UMBILICAL HERNIA REPAIR    ? ? ?Family History  ?Problem Relation Age of Onset  ? COPD Mother   ? Hypertension Mother   ? Emphysema Mother   ? ? ?Social History  ? ?Socioeconomic History  ? Marital status: Married  ?  Spouse name: Kalman Shan   ? Number of children: 1  ? Years of education: college  ? Highest education level: Some college, no degree  ?Occupational History  ? Occupation: Retired   ?  Comment: [employer]   ?Tobacco Use  ? Smoking status: Former  ?  Types: Cigarettes  ?  Quit date: 02/09/1980  ?  Years since quitting:  42.0  ? Smokeless tobacco: Former  ?Vaping Use  ? Vaping Use: Never used  ?Substance and Sexual Activity  ? Alcohol use: Yes  ?  Comment: occasionally  ? Drug use: No  ? Sexual activity: Yes  ?Other Topics Concern  ? Not on file  ?Social History Narrative  ? Golf about 2 times per week.  No regular exercise.  . Member of [employer]  ? ?Social Determinants of Health  ? ?Financial Resource Strain: Not on file  ?Food Insecurity: Not on file  ?Transportation Needs: Not on file  ?Physical Activity: Not on file  ?Stress: Not on file  ?Social Connections: Not on file  ?Intimate Partner Violence: Not on file  ? ? ?Outpatient Medications Prior to Visit  ?Medication Sig Dispense Refill  ? Accu-Chek FastClix Lancets MISC Dx DM E11.9 Check fasting blood sugar daily and after largest meal of the day a couple time a week. 102 each 11  ? finasteride (PROSCAR) 5 MG tablet Take 1 tablet (5 mg total) by mouth daily. 30 tablet 1  ? glucose blood (ACCU-CHEK GUIDE) test strip Check  fasting blood sugar every morning. Dx: E11.9 100 each 11  ? loratadine (CLARITIN) 10 MG tablet Take 10 mg by mouth daily as needed for allergies.     ? metFORMIN (GLUCOPHAGE-XR) 500 MG 24 hr tablet Take 1 tablet (500 mg total) by mouth daily with breakfast. 90 tablet 0  ? methocarbamol (ROBAXIN) 500 MG tablet Take 1 tablet (500 mg total) by mouth at bedtime as needed for muscle spasms. 30 tablet 0  ? Misc. Devices MISC New bipap machine at current settings.    ? pantoprazole (PROTONIX) 40 MG tablet TAKE 1 TABLET BY MOUTH EVERY DAY 90 tablet 3  ? simvastatin (ZOCOR) 40 MG tablet TAKE 1 TABLET (40 MG TOTAL) BY MOUTH DAILY AT 6 PM. 90 tablet 3  ? tamsulosin (FLOMAX) 0.4 MG CAPS capsule Take 0.4 mg by mouth daily.     ? triamcinolone cream (KENALOG) 0.1 % as needed.    ? valsartan-hydrochlorothiazide (DIOVAN-HCT) 160-12.5 MG tablet TAKE 1 TABLET BY MOUTH EVERY DAY 90 tablet 1  ? ?No facility-administered medications prior to visit.  ? ? ?Allergies   ?Allergen Reactions  ? Amoxicillin Swelling  ?  Tongue sores  ? Iodinated Contrast Media Shortness Of Breath and Rash  ?  Patient gets hot and sweating  ? Lisinopril Cough and Rash  ?  Other reaction(s): Cough  ? ? ?ROS ?Review of Systems ? ?  ?Objective:  ?  ?Physical Exam ?Constitutional:   ?   Appearance: Normal appearance. He is well-developed.  ?HENT:  ?   Head: Normocephalic and atraumatic.  ?Cardiovascular:  ?   Rate and Rhythm: Normal rate and regular rhythm.  ?   Heart sounds: Normal heart sounds.  ?Pulmonary:  ?   Effort: Pulmonary effort is normal.  ?   Breath sounds: Normal breath sounds.  ?Skin: ?   General: Skin is warm and dry.  ?Neurological:  ?   Mental Status: He is alert and oriented to person, place, and time. Mental status is at baseline.  ?Psychiatric:     ?   Behavior: Behavior normal.  ? ? ?BP (!) 130/56   Pulse 72   Resp 18   Ht _0  (1.753 m)   Wt (!) 325 lb (147.4 kg)   SpO2 94%   BMI 47.99 kg/m?  ?Wt Readings from Last 3 Encounters:  ?02/21/22 (!) 325 lb (147.4 kg)  ?11/24/21 (!) 322 lb (146.1 kg)  ?10/21/21 (!) 322 lb 15.6 oz (146.5 kg)  ? ? ? ?There are no preventive care reminders to display for this patient. ? ?There are no preventive care reminders to display for this patient. ? ?Lab Results  ?Component Value Date  ? TSH 1.153 10/05/2015  ? ?Lab Results  ?Component Value Date  ? WBC 12.0 (H) 02/12/2019  ? HGB 15.2 02/12/2019  ? HCT 45.2 02/12/2019  ? MCV 85.1 02/12/2019  ? PLT 179 02/12/2019  ? ?Lab Results  ?Component Value Date  ? NA 137 09/29/2021  ? K 3.7 09/29/2021  ? CO2 25 09/29/2021  ? GLUCOSE 138 (H) 09/29/2021  ? BUN 14 09/29/2021  ? CREATININE 1.18 09/29/2021  ? BILITOT 0.9 09/29/2021  ? ALKPHOS 48 01/21/2018  ? AST 45 (H) 09/29/2021  ? ALT 61 (H) 09/29/2021  ? PROT 6.5 09/29/2021  ? ALBUMIN 3.7 01/21/2018  ? CALCIUM 9.2 09/29/2021  ? ANIONGAP 10 01/21/2018  ? EGFR 64 09/29/2021  ? ?Lab Results  ?Component Value Date  ? CHOL 119 09/29/2021  ? ?Lab  Results   ?Component Value Date  ? HDL 30 (L) 09/29/2021  ? ?Lab Results  ?Component Value Date  ? [place] 69 09/29/2021  ? ?Lab Results  ?Component Value Date  ? TRIG 113 09/29/2021  ? ?Lab Results  ?Component Value Date  ? CHOLHDL 4.0 09/29/2021  ? ?Lab Results  ?Component Value Date  ? HGBA1C 7.1 (A) 02/21/2022  ? ? ?  ?Assessment & Plan:  ? ?Problem List Items Addressed This Visit   ? ?  ? Cardiovascular and Mediastinum  ? Hypertension - Primary  ?  Well controlled. Continue current regimen. Follow up in 3 months.   ? ?  ?  ? Aortic atherosclerosis ([place])  ?  Continue statin therapy  ?  ?  ? (HFpEF) heart failure with preserved ejection fraction ([place])  ?  No sign of volume overload today.  Follows with Dr. Caryl Comes yearly.  ?  ?  ?  ? Endocrine  ? Controlled diabetes mellitus type 2 with complications ([place])  ?  A1c stable at 7.1.  We discussed options including going up on the metformin though he does not really feel like he is noticed a big difference in better control on his blood sugars.  We could certainly switch to a GLP-1 though he seems a little hesitant about an injection and says he would really need to think about it.  Or we could switch to a flow sign.. Continue current regimen. Follow up in  3 months.  ?  ?  ? Relevant Orders  ? POCT HgB A1C (Completed)  ?  ? Other  ? BMI 45.0-49.9, adult ([place])  ?  Encouraged him to continue to work on healthy diet and weight loss.  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 ?  ?  ? ? ?No orders of the defined types were placed in this encounter. ? ? ?Follow-up: Return in about 3 months (around 05/24/2022) for Diabetes follow-up, Hypertension.   ? ? ?Beatrice Lecher, MD ?

## 2022-02-21 NOTE — Assessment & Plan Note (Signed)
No sign of volume overload today.  Follows with Dr. Caryl Comes yearly.  ?

## 2022-02-21 NOTE — Assessment & Plan Note (Signed)
A1c stable at 7.1.  We discussed options including going up on the metformin though he does not really feel like he is noticed a big difference in better control on his blood sugars.  We could certainly switch to a GLP-1 though he seems a little hesitant about an injection and says he would really need to think about it.  Or we could switch to a flow sign.. Continue current regimen. Follow up in  3 months.  ?

## 2022-02-21 NOTE — Telephone Encounter (Signed)
Mychart sent.

## 2022-02-21 NOTE — Assessment & Plan Note (Signed)
Well controlled. Continue current regimen. Follow up in  3 months.  

## 2022-02-21 NOTE — Assessment & Plan Note (Signed)
Encouraged him to continue to work on healthy diet and weight loss.  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 ?

## 2022-02-21 NOTE — Assessment & Plan Note (Signed)
Continue statin therapy.

## 2022-02-22 DIAGNOSIS — N401 Enlarged prostate with lower urinary tract symptoms: Secondary | ICD-10-CM | POA: Diagnosis not present

## 2022-02-22 DIAGNOSIS — R399 Unspecified symptoms and signs involving the genitourinary system: Secondary | ICD-10-CM | POA: Diagnosis not present

## 2022-02-22 DIAGNOSIS — N3943 Post-void dribbling: Secondary | ICD-10-CM | POA: Diagnosis not present

## 2022-02-23 DIAGNOSIS — G4733 Obstructive sleep apnea (adult) (pediatric): Secondary | ICD-10-CM | POA: Diagnosis not present

## 2022-02-23 DIAGNOSIS — J3089 Other allergic rhinitis: Secondary | ICD-10-CM | POA: Diagnosis not present

## 2022-02-23 DIAGNOSIS — Z6841 Body Mass Index (BMI) 40.0 and over, adult: Secondary | ICD-10-CM | POA: Diagnosis not present

## 2022-03-08 DIAGNOSIS — H6123 Impacted cerumen, bilateral: Secondary | ICD-10-CM | POA: Diagnosis not present

## 2022-03-14 ENCOUNTER — Other Ambulatory Visit: Payer: Self-pay | Admitting: Family Medicine

## 2022-05-05 ENCOUNTER — Telehealth: Payer: Medicare Other | Admitting: Physician Assistant

## 2022-05-05 DIAGNOSIS — L247 Irritant contact dermatitis due to plants, except food: Secondary | ICD-10-CM | POA: Diagnosis not present

## 2022-05-05 MED ORDER — PREDNISONE 10 MG PO TABS: ORAL_TABLET | ORAL | 0 refills | Status: DC

## 2022-05-05 NOTE — Progress Notes (Signed)
E-Visit for Poison Ivy  We are sorry that you are not feeing well.  Here is how we plan to help!  Based on what you have shared with me it looks like you have had an allergic reaction to the oily resin from a group of plants.  This resin is very sticky, so it easily attaches to your skin, clothing, tools equipment, and pet's fur.    This blistering rash is often called poison ivy rash although it can come from contact with the leaves, stems and roots of poison ivy, poison oak and poison sumac.  The oily resin contains urushiol (u-ROO-she-ol) that produces a skin rash on exposed skin.  The severity of the rash depends on the amount of urushiol that gets on your skin.  A section of skin with more urushiol on it may develop a rash sooner.  The rash usually develops 12-48 hours after exposure and can last two to three weeks.  Your skin must come in direct contact with the plant's oil to be affected.  Blister fluid doesn't spread the rash.  However, if you come into contact with a piece of clothing or pet fur that has urushiol on it, the rash may spread out.  You can also transfer the oil to other parts of your body with your fingers.  Often the rash looks like a straight line because of the way the plant brushes against your skin.  Since your rash is widespread or has resulted in a large number of blisters, I have prescribed an oral corticosteroid.  Please follow these recommendations:  I have sent a prednisone dose pack to your chosen pharmacy. Be sure to follow the instructions carefully and complete the entire prescription. You may use Benadryl or Caladryl topical lotions to sooth the itch and remember cool, not hot, showers and baths can help relieve the itching!  Place cool, wet compresses on the affected area for 15-30 minutes several times a day.  You may also take oral antihistamines, such as diphenhydramine (Benadryl, others), which may also help you sleep better.  Watch your skin for any purulent  (pus) drainage or red streaking from the site.  If this occurs, contact your provider.  You may require an antibiotic for a skin infection.  Make sure that the clothes you were wearing as well as any towels or sheets that may have come in contact with the oil (urushiol) are washed in detergent and hot water.       I have developed the following plan to treat your condition I am prescribing a two week course of steroids (37 tablets of 10 mg prednisone).  Days 1-4 take 4 tablets (40 mg) daily  Days 5-8 take 3 tablets (30 mg) daily, Days 9-11 take 2 tablets (20 mg) daily, Days 12-14 take 1 tablet (10 mg) daily.    What can you do to prevent this rash?  Avoid the plants.  Learn how to identify poison ivy, poison oak and poison sumac in all seasons.  When hiking or engaging in other activities that might expose you to these plants, try to stay on cleared pathways.  If camping, make sure you pitch your tent in an area free of these plants.  Keep pets from running through wooded areas so that urushiol doesn't accidentally stick to their fur, which you may touch.  Remove or kill the plants.  In your yard, you can get rid of poison ivy by applying an herbicide or pulling it out of   the ground, including the roots, while wearing heavy gloves.  Afterward remove the gloves and thoroughly wash them and your hands.  Don't burn poison ivy or related plants because the urushiol can be carried by smoke.  Wear protective clothing.  If needed, protect your skin by wearing socks, boots, pants, long sleeves and vinyl gloves.  Wash your skin right away.  Washing off the oil with soap and water within 30 minutes of exposure may reduce your chances of getting a poison ivy rash.  Even washing after an hour or so can help reduce the severity of the rash.  If you walk through some poison ivy and then later touch your shoes, you may get some urushiol on your hands, which may then transfer to your face or body by touching or  rubbing.  If the contaminated object isn't cleaned, the urushiol on it can still cause a skin reaction years later.    Be careful not to reuse towels after you have washed your skin.  Also carefully wash clothing in detergent and hot water to remove all traces of the oil.  Handle contaminated clothing carefully so you don't transfer the urushiol to yourself, furniture, rugs or appliances.  Remember that pets can carry the oil on their fur and paws.  If you think your pet may be contaminated with urushiol, put on some long rubber gloves and give your pet a bath.  Finally, be careful not to burn these plants as the smoke can contain traces of the oil.  Inhaling the smoke may result in difficulty breathing. If that occurred you should see a physician as soon as possible.  See your doctor right away if:  The reaction is severe or widespread You inhaled the smoke from burning poison ivy and are having difficulty breathing Your skin continues to swell The rash affects your eyes, mouth or genitals Blisters are oozing pus You develop a fever greater than 100 F (37.8 C) The rash doesn't get better within a few weeks.  If you scratch the poison ivy rash, bacteria under your fingernails may cause the skin to become infected.  See your doctor if pus starts oozing from the blisters.  Treatment generally includes antibiotics.  Poison ivy treatments are usually limited to self-care methods.  And the rash typically goes away on its own in two to three weeks.     If the rash is widespread or results in a large number of blisters, your doctor may prescribe an oral corticosteroid, such as prednisone.  If a bacterial infection has developed at the rash site, your doctor may give you a prescription for an oral antibiotic.  MAKE SURE YOU  Understand these instructions. Will watch your condition. Will get help right away if you are not doing well or get worse.   Thank you for choosing an e-visit.  Your  e-visit answers were reviewed by a board certified advanced clinical practitioner to complete your personal care plan. Depending upon the condition, your plan could have included both over the counter or prescription medications.  Please review your pharmacy choice. Make sure the pharmacy is open so you can pick up prescription now. If there is a problem, you may contact your provider through MyChart messaging and have the prescription routed to another pharmacy.  Your safety is important to us. If you have drug allergies check your prescription carefully.   For the next 24 hours you can use MyChart to ask questions about today's visit, request a non-urgent   call back, or ask for a work or school excuse. You will get an email in the next two days asking about your experience. I hope that your e-visit has been valuable and will speed your recovery.   I provided 5 minutes of non face-to-face time during this encounter for chart review and documentation.   

## 2022-05-09 DIAGNOSIS — L309 Dermatitis, unspecified: Secondary | ICD-10-CM | POA: Diagnosis not present

## 2022-05-16 DIAGNOSIS — L570 Actinic keratosis: Secondary | ICD-10-CM | POA: Diagnosis not present

## 2022-05-16 DIAGNOSIS — L820 Inflamed seborrheic keratosis: Secondary | ICD-10-CM | POA: Diagnosis not present

## 2022-05-16 DIAGNOSIS — Z129 Encounter for screening for malignant neoplasm, site unspecified: Secondary | ICD-10-CM | POA: Diagnosis not present

## 2022-05-16 DIAGNOSIS — Z86008 Personal history of in-situ neoplasm of other site: Secondary | ICD-10-CM | POA: Diagnosis not present

## 2022-05-22 DIAGNOSIS — H0288B Meibomian gland dysfunction left eye, upper and lower eyelids: Secondary | ICD-10-CM | POA: Diagnosis not present

## 2022-05-22 DIAGNOSIS — H0288A Meibomian gland dysfunction right eye, upper and lower eyelids: Secondary | ICD-10-CM | POA: Diagnosis not present

## 2022-05-22 DIAGNOSIS — H02831 Dermatochalasis of right upper eyelid: Secondary | ICD-10-CM | POA: Diagnosis not present

## 2022-05-22 DIAGNOSIS — H02833 Dermatochalasis of right eye, unspecified eyelid: Secondary | ICD-10-CM | POA: Diagnosis not present

## 2022-05-22 DIAGNOSIS — H02834 Dermatochalasis of left upper eyelid: Secondary | ICD-10-CM | POA: Diagnosis not present

## 2022-05-22 DIAGNOSIS — Z961 Presence of intraocular lens: Secondary | ICD-10-CM | POA: Diagnosis not present

## 2022-05-22 DIAGNOSIS — H02836 Dermatochalasis of left eye, unspecified eyelid: Secondary | ICD-10-CM | POA: Diagnosis not present

## 2022-05-22 DIAGNOSIS — Z9889 Other specified postprocedural states: Secondary | ICD-10-CM | POA: Diagnosis not present

## 2022-05-22 DIAGNOSIS — H02835 Dermatochalasis of left lower eyelid: Secondary | ICD-10-CM | POA: Diagnosis not present

## 2022-05-22 DIAGNOSIS — H02832 Dermatochalasis of right lower eyelid: Secondary | ICD-10-CM | POA: Diagnosis not present

## 2022-05-22 DIAGNOSIS — H04123 Dry eye syndrome of bilateral lacrimal glands: Secondary | ICD-10-CM | POA: Diagnosis not present

## 2022-05-22 DIAGNOSIS — H1013 Acute atopic conjunctivitis, bilateral: Secondary | ICD-10-CM | POA: Diagnosis not present

## 2022-05-25 ENCOUNTER — Encounter: Payer: Self-pay | Admitting: Family Medicine

## 2022-05-25 ENCOUNTER — Ambulatory Visit (INDEPENDENT_AMBULATORY_CARE_PROVIDER_SITE_OTHER): Payer: Medicare Other | Admitting: Family Medicine

## 2022-05-25 VITALS — BP 137/64 | HR 71 | Ht 69.0 in | Wt 327.0 lb

## 2022-05-25 DIAGNOSIS — E118 Type 2 diabetes mellitus with unspecified complications: Secondary | ICD-10-CM

## 2022-05-25 DIAGNOSIS — I10 Essential (primary) hypertension: Secondary | ICD-10-CM

## 2022-05-25 DIAGNOSIS — M25511 Pain in right shoulder: Secondary | ICD-10-CM

## 2022-05-25 LAB — POCT GLYCOSYLATED HEMOGLOBIN (HGB A1C): Hemoglobin A1C: 7.5 % — AB (ref 4.0–5.6)

## 2022-05-25 LAB — POCT UA - MICROALBUMIN
Creatinine, POC: 200 mg/dL
Microalbumin Ur, POC: 80 mg/L

## 2022-05-25 MED ORDER — PREDNISONE 20 MG PO TABS: 40.0000 mg | ORAL_TABLET | Freq: Every day | ORAL | 0 refills | Status: DC

## 2022-05-25 MED ORDER — METFORMIN HCL ER 500 MG PO TB24: 500.0000 mg | ORAL_TABLET | Freq: Two times a day (BID) | ORAL | 0 refills | Status: DC

## 2022-05-25 NOTE — Patient Instructions (Signed)
Increase metformin to 2 tabs daily.  You can take 2 at 1 time or you can separate it and take 1 in the morning and 1 around her evening meal. If your blood sugars are not improving after about 4 weeks then please let me know.

## 2022-05-25 NOTE — Progress Notes (Signed)
Established Patient Office Visit  Subjective   Patient ID: Tracy Bennett, male    DOB: 11/22/1946  Age: 76 y.o. MRN: [id number]  Chief Complaint  Patient presents with   Diabetes    HPI  Diabetes - no hypoglycemic events. No wounds or sores that are not healing well. No increased thirst or urination. Checking glucose at home. Taking medications as prescribed without any side effects.  He says he has not been doing great with his diet he has gotten into some more bread and sweets lately.  He has been taking the metformin.  He did have his eye exam done last week.  Hypertension- Pt denies chest pain, SOB, dizziness, or heart palpitations.  Taking meds as directed w/o problems.  Denies medication side effects.    He also wanted to let me know about an allergic reaction that he had.  He had severe and intense itching on his palms and soles rash on his legs and abdomen and even some throat swelling.  He ended up seeing his dermatologist after being put on prednisone after an ED visit.  They were not sure what caused it but it does seem to be getting better on the prednisone taper.  New onset right shoulder pain from neck to above elbo that started recently as tapering off his steroids.     ROS    Objective:     BP 137/64   Pulse 71   Ht '5\' 9"'$  (1.753 m)   Wt (!) 327 lb (148.3 kg)   SpO2 94%   BMI 48.29 kg/m    Physical Exam Constitutional:      Appearance: He is well-developed.  HENT:     Head: Normocephalic and atraumatic.  Cardiovascular:     Rate and Rhythm: Normal rate and regular rhythm.     Heart sounds: Normal heart sounds.  Pulmonary:     Effort: Pulmonary effort is normal.     Breath sounds: Normal breath sounds.  Musculoskeletal:     Comments: Right shoulder with normal range of motion.  Decreased rotation of the cervical spine to the right and left.  Though he has more pain looking to the left.  Normal flexion and extension.  Skin:    General: Skin is warm  and dry.  Neurological:     Mental Status: He is alert and oriented to person, place, and time.  Psychiatric:        Behavior: Behavior normal.      Results for orders placed or performed in visit on 05/25/22  POCT glycosylated hemoglobin (Hb A1C)  Result Value Ref Range   Hemoglobin A1C 7.5 (A) 4.0 - 5.6 %   HbA1c POC (<> result, manual entry)     HbA1c, POC (prediabetic range)     HbA1c, POC (controlled diabetic range)    POCT UA - Microalbumin  Result Value Ref Range   Microalbumin Ur, POC 80 mg/L   Creatinine, POC 200 mg/dL   Albumin/Creatinine Ratio, Urine, POC 30-300       The ASCVD Risk score (Arnett DK, et al., 2019) failed to calculate for the following reasons:   The valid total cholesterol range is 130 to 320 mg/dL    Assessment & Plan:   Problem List Items Addressed This Visit       Cardiovascular and Mediastinum   Hypertension    Well controlled. Continue current regimen. Follow up in  31mo        Endocrine   Controlled  diabetes mellitus type 2 with complications (HCC) - Primary    A1c is uncontrolled today at 7.5.  Discussed options including starting a GLP-1.  He does not want to do an injection so we discussed Rybelsus as an option.  He did not feel comfortable doing that but is okay with trying to increase his metformin so we will increase to 2 tabs.  If his blood sugars are not improving over the next month encouraged him to give [place] a call so that we can make an adjustment.      Relevant Medications   metFORMIN (GLUCOPHAGE-XR) 500 MG 24 hr tablet   Other Relevant Orders   POCT glycosylated hemoglobin (Hb A1C) (Completed)   POCT UA - Microalbumin (Completed)   Other Visit Diagnoses     Acute pain of right shoulder       Relevant Medications   predniSONE (DELTASONE) 20 MG tablet       Right shoulder pain-I suspect it is actually some cervical radiculopathy that is occurring.  He says he has had this happen before when he started to taper off  of prednisone he started to get pain he says it is mostly bothersome at night it feels like a sharp shooting pain that keeps him awake.  We discussed maybe just doing a short burst of prednisone and then coming off of it and seeing if that helps if the pain continues please let me know.  Return in about 3 months (around 08/25/2022) for Diabetes follow-up.    Beatrice Lecher, MD

## 2022-05-25 NOTE — Assessment & Plan Note (Signed)
A1c is uncontrolled today at 7.5.  Discussed options including starting a GLP-1.  He does not want to do an injection so we discussed Rybelsus as an option.  He did not feel comfortable doing that but is okay with trying to increase his metformin so we will increase to 2 tabs.  If his blood sugars are not improving over the next month encouraged him to give [place] a call so that we can make an adjustment.

## 2022-05-25 NOTE — Assessment & Plan Note (Signed)
Well controlled. Continue current regimen. Follow up in  4 mo 

## 2022-07-19 ENCOUNTER — Encounter: Payer: Self-pay | Admitting: General Practice

## 2022-08-08 DIAGNOSIS — H6123 Impacted cerumen, bilateral: Secondary | ICD-10-CM | POA: Diagnosis not present

## 2022-08-11 ENCOUNTER — Other Ambulatory Visit: Payer: Self-pay | Admitting: Family Medicine

## 2022-08-11 DIAGNOSIS — I10 Essential (primary) hypertension: Secondary | ICD-10-CM

## 2022-08-21 ENCOUNTER — Other Ambulatory Visit: Payer: Self-pay | Admitting: Family Medicine

## 2022-08-21 DIAGNOSIS — E118 Type 2 diabetes mellitus with unspecified complications: Secondary | ICD-10-CM

## 2022-08-28 ENCOUNTER — Ambulatory Visit (INDEPENDENT_AMBULATORY_CARE_PROVIDER_SITE_OTHER): Payer: Medicare Other | Admitting: Family Medicine

## 2022-08-28 ENCOUNTER — Encounter: Payer: Self-pay | Admitting: Family Medicine

## 2022-08-28 VITALS — BP 139/67 | HR 70 | Ht 69.0 in | Wt 332.0 lb

## 2022-08-28 DIAGNOSIS — I10 Essential (primary) hypertension: Secondary | ICD-10-CM | POA: Diagnosis not present

## 2022-08-28 DIAGNOSIS — E118 Type 2 diabetes mellitus with unspecified complications: Secondary | ICD-10-CM | POA: Diagnosis not present

## 2022-08-28 DIAGNOSIS — R059 Cough, unspecified: Secondary | ICD-10-CM | POA: Diagnosis not present

## 2022-08-28 DIAGNOSIS — J069 Acute upper respiratory infection, unspecified: Secondary | ICD-10-CM

## 2022-08-28 LAB — POCT GLYCOSYLATED HEMOGLOBIN (HGB A1C): Hemoglobin A1C: 8.4 % — AB (ref 4.0–5.6)

## 2022-08-28 LAB — POC COVID19 BINAXNOW: SARS Coronavirus 2 Ag: NEGATIVE

## 2022-08-28 MED ORDER — HYDROCODONE BIT-HOMATROP MBR 5-1.5 MG/5ML PO SOLN: 5.0000 mL | Freq: Every evening | ORAL | 0 refills | Status: DC | PRN

## 2022-08-28 MED ORDER — RYBELSUS 3 MG PO TABS: 3.0000 mg | ORAL_TABLET | Freq: Every day | ORAL | 0 refills | Status: DC

## 2022-08-28 MED ORDER — RYBELSUS 7 MG PO TABS: 7.0000 mg | ORAL_TABLET | Freq: Every day | ORAL | 1 refills | Status: DC

## 2022-08-28 NOTE — Assessment & Plan Note (Signed)
Pressure just at goal little bit borderline today so we will keep an eye on that.

## 2022-08-28 NOTE — Progress Notes (Signed)
Established Patient Office Visit  Subjective   Patient ID: Tracy Bennett, male    DOB: [birth year]  Age: 76 y.o. MRN: [id number]  Chief Complaint  Patient presents with   Diabetes   Cough    HPI  Diabetes - no hypoglycemic events. No wounds or sores that are not healing well. No increased thirst or urination. Checking glucose at home. Taking medications as prescribed without any side effects.  He admits his diet has not been great.  His wife actually had knee replacement surgery and he has been helping to take care of her but he has been eating a lot more carbs and breads.  He also reports that he has been coughing and felt a little bit more short of breath for about 4 to 5 days.  No sore throat.  No fevers or chills.  He feels that the cough is a little bit more dry.  He had some old cough syrup that he use but was little nervous to take it because it was old.  Did follow-up for his ears at [facility] last week and had cerumen removed.  Hypertension- Pt denies chest pain, SOB, dizziness, or heart palpitations.  Taking meds as directed w/o problems.  Denies medication side effects.       ROS    Objective:     BP 139/67   Pulse 70   Ht '5\' 9"'$  (1.753 m)   Wt (!) 332 lb (150.6 kg)   SpO2 94%   BMI 49.03 kg/m    Physical Exam Constitutional:      Appearance: He is well-developed.  HENT:     Head: Normocephalic and atraumatic.     Right Ear: Tympanic membrane, ear canal and external ear normal.     Left Ear: Tympanic membrane, ear canal and external ear normal.     Nose: Nose normal.     Mouth/Throat:     Pharynx: Oropharynx is clear.  Eyes:     Conjunctiva/sclera: Conjunctivae normal.     Pupils: Pupils are equal, round, and reactive to light.  Neck:     Thyroid: No thyromegaly.  Cardiovascular:     Rate and Rhythm: Normal rate and regular rhythm.     Heart sounds: Normal heart sounds.  Pulmonary:     Effort: Pulmonary effort is normal.     Breath sounds: Normal  breath sounds.  Musculoskeletal:     Cervical back: Neck supple.  Lymphadenopathy:     Cervical: No cervical adenopathy.  Skin:    General: Skin is warm and dry.  Neurological:     Mental Status: He is alert and oriented to person, place, and time.  Psychiatric:        Behavior: Behavior normal.      Results for orders placed or performed in visit on 08/28/22  POCT glycosylated hemoglobin (Hb A1C)  Result Value Ref Range   Hemoglobin A1C 8.4 (A) 4.0 - 5.6 %   HbA1c POC (<> result, manual entry)     HbA1c, POC (prediabetic range)     HbA1c, POC (controlled diabetic range)    POC COVID-19  Result Value Ref Range   SARS Coronavirus 2 Ag Negative Negative      The ASCVD Risk score (Arnett DK, et al., 2019) failed to calculate for the following reasons:   The valid total cholesterol range is 130 to 320 mg/dL    Assessment & Plan:   Problem List Items Addressed This Visit  Cardiovascular and Mediastinum   Hypertension    Pressure just at goal little bit borderline today so we will keep an eye on that.        Endocrine   Controlled diabetes mellitus type 2 with complications ([place]) - Primary    1C uncontrolled today.  Would like to see if his insurance would cover one of the GLP-1's but it may be cost prohibitive.  I am get a send over prescription for Rybelsus for him to try.  He is open to at least trying it as long as the insurance coverage is decent.  He to work on [employer] and regular exercise he admits he has been eating a little bit more indiscriminately lately.  Just encouraged him to get back on track.  Lab Results  Component Value Date   HGBA1C 8.4 (A) 08/28/2022         Relevant Medications   Semaglutide (RYBELSUS) 3 MG TABS   Semaglutide (RYBELSUS) 7 MG TABS (Start on 09/26/2022)   Other Relevant Orders   POCT glycosylated hemoglobin (Hb A1C) (Completed)   Other Visit Diagnoses     Cough, unspecified type       Relevant Orders    POC COVID-19 (Completed)   Viral upper respiratory tract infection           Upper respiratory infection-likely viral.  Negative COVID test today.  If not improving over the next couple days or if cough becomes more productive then we will treat if needed.  Return in about 3 months (around 11/28/2022) for dm.    Beatrice Lecher, MD

## 2022-08-28 NOTE — Assessment & Plan Note (Signed)
1C uncontrolled today.  Would like to see if his insurance would cover one of the GLP-1's but it may be cost prohibitive.  I am get a send over prescription for Rybelsus for him to try.  He is open to at least trying it as long as the insurance coverage is decent.  He to work on [employer] and regular exercise he admits he has been eating a little bit more indiscriminately lately.  Just encouraged him to get back on track.  Lab Results  Component Value Date   HGBA1C 8.4 (A) 08/28/2022

## 2022-09-04 ENCOUNTER — Telehealth: Payer: Self-pay | Admitting: Neurology

## 2022-09-04 DIAGNOSIS — E118 Type 2 diabetes mellitus with unspecified complications: Secondary | ICD-10-CM

## 2022-09-04 MED ORDER — TIRZEPATIDE 2.5 MG/0.5ML ~~LOC~~ SOAJ: 2.5000 mg | SUBCUTANEOUS | 0 refills | Status: DC

## 2022-09-04 MED ORDER — TIRZEPATIDE 5 MG/0.5ML ~~LOC~~ SOAJ: 5.0000 mg | SUBCUTANEOUS | 0 refills | Status: DC

## 2022-09-04 NOTE — Telephone Encounter (Signed)
Changed to [facility]  Meds ordered this encounter  Medications   tirzepatide [facility]) 2.5 MG/0.5ML Pen    Sig: Inject 2.5 mg into the skin once a week.    Dispense:  2 mL    Refill:  0   tirzepatide (MOUNJARO) 5 MG/0.5ML Pen    Sig: Inject 5 mg into the skin once a week.    Dispense:  2 mL    Refill:  0

## 2022-09-04 NOTE — Telephone Encounter (Signed)
Patient left vm concerned about price of Rybelsus. He wants to consider injections instead. Would you want to prescribe Ozempic?

## 2022-09-05 NOTE — Telephone Encounter (Signed)
Tried to call patient, unavailable, will call back later.

## 2022-09-06 NOTE — Telephone Encounter (Signed)
Left message on voicemail on mobile phone for a return call.

## 2022-09-07 NOTE — Telephone Encounter (Signed)
Patient advised.

## 2022-09-07 NOTE — Telephone Encounter (Signed)
Left message for a return call

## 2022-09-07 NOTE — Telephone Encounter (Signed)
Patient left vm that he has not received a call back about Rybelsus being too expensive. Tried to call again and received vm. LVM letting him know we have tried to reach him several times and Dr. Madilyn Fireman sent alternative medication to the pharmacy. Asked for him to call back to discuss.

## 2022-09-08 DIAGNOSIS — Z23 Encounter for immunization: Secondary | ICD-10-CM | POA: Diagnosis not present

## 2022-09-17 ENCOUNTER — Other Ambulatory Visit: Payer: Self-pay | Admitting: Family Medicine

## 2022-09-17 DIAGNOSIS — E785 Hyperlipidemia, unspecified: Secondary | ICD-10-CM

## 2022-09-30 IMAGING — DX DG CERVICAL SPINE COMPLETE 4+V
6 series · 6 of 6 positions shown · non-contrast
Comparison: 03/23/2016

CLINICAL DATA: History of cervical fusions, severe pain radiating
to the right arm and upper back

EXAM:
CERVICAL SPINE - COMPLETE 4+ VIEW

[c-spine lat]
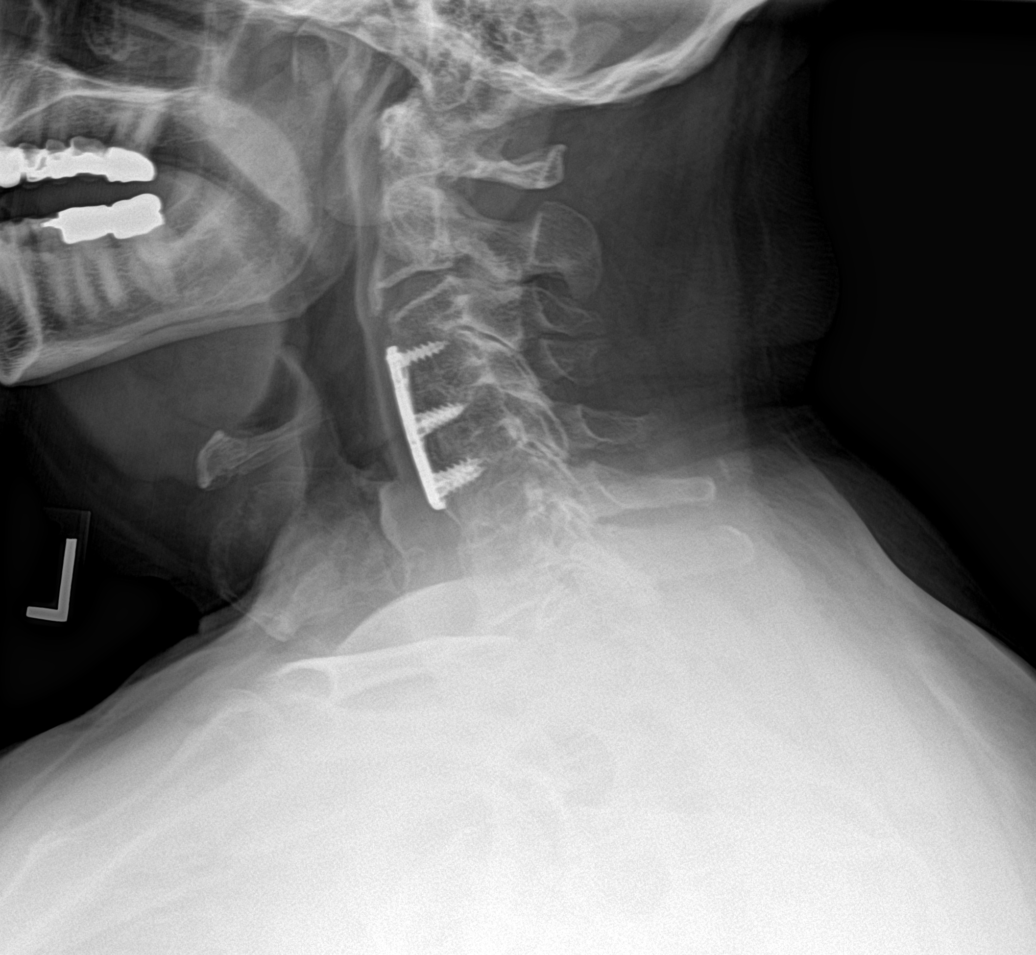

[c-spine obl (1 of 2)]
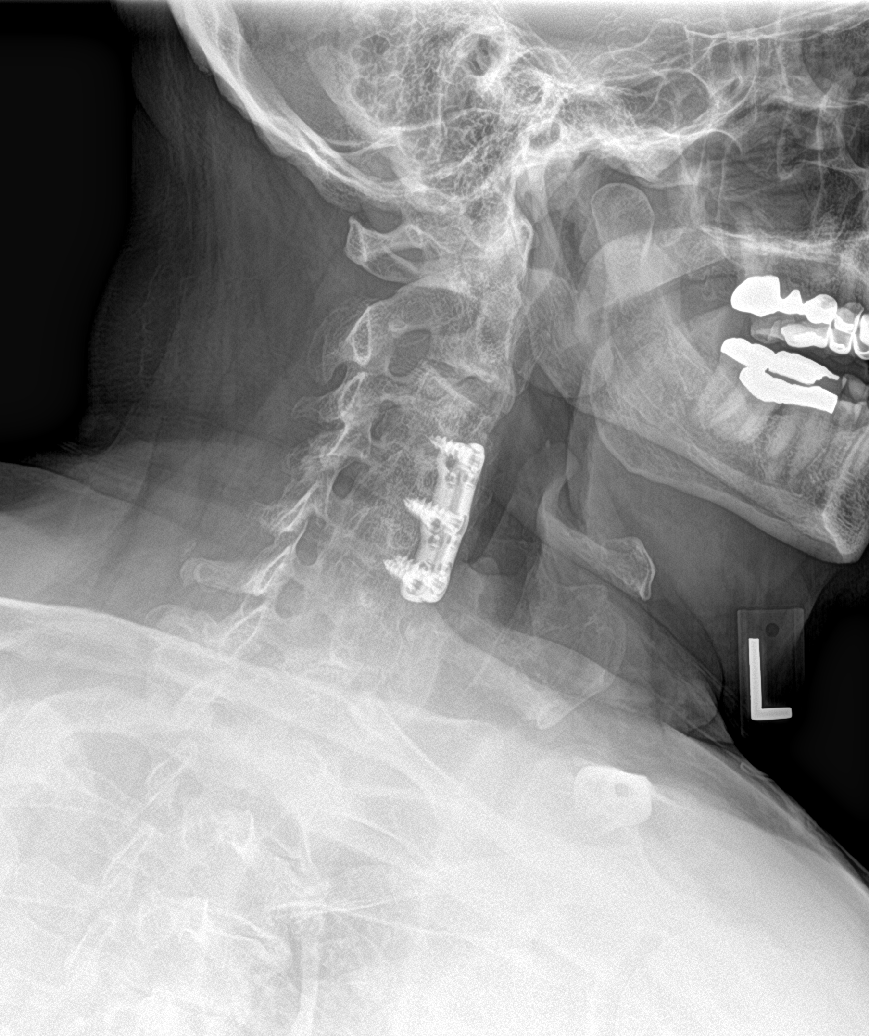

[c-spine obl (2 of 2)]
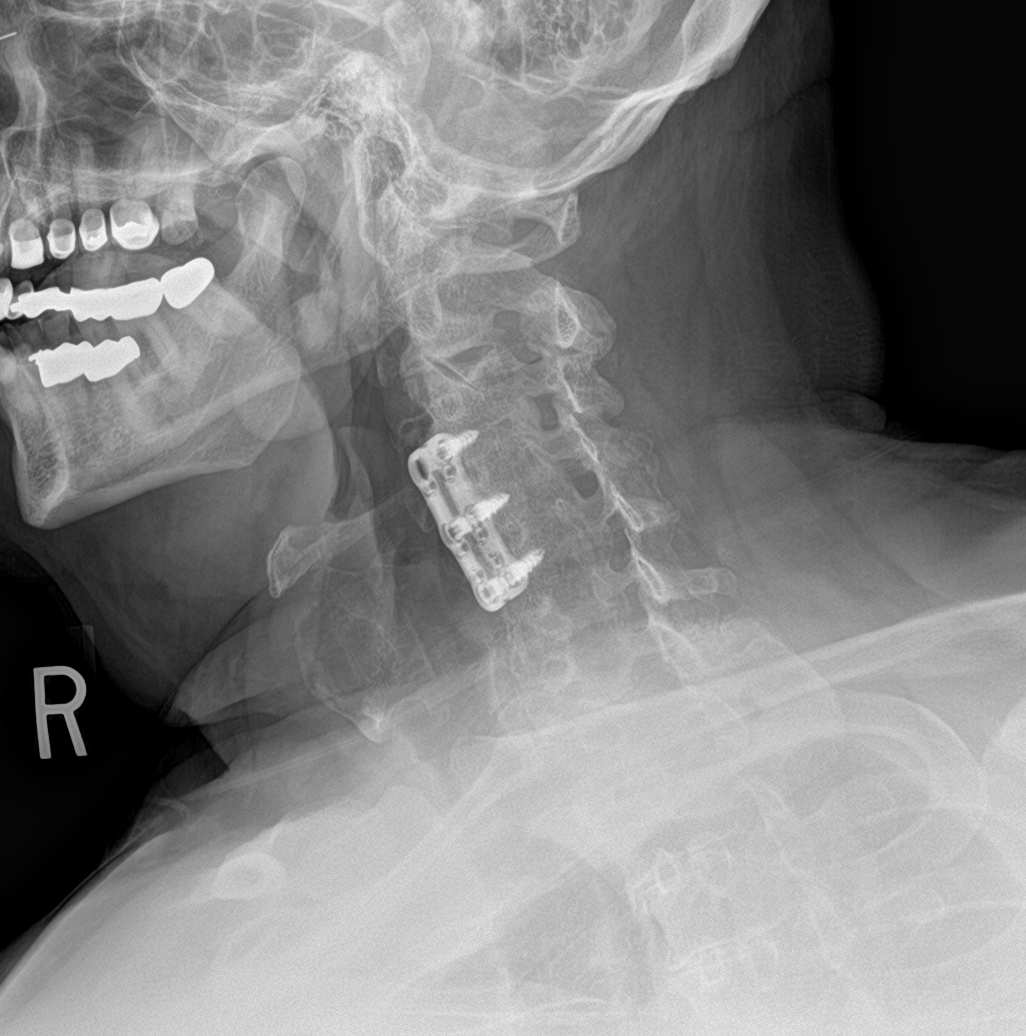

[c-spine ap]
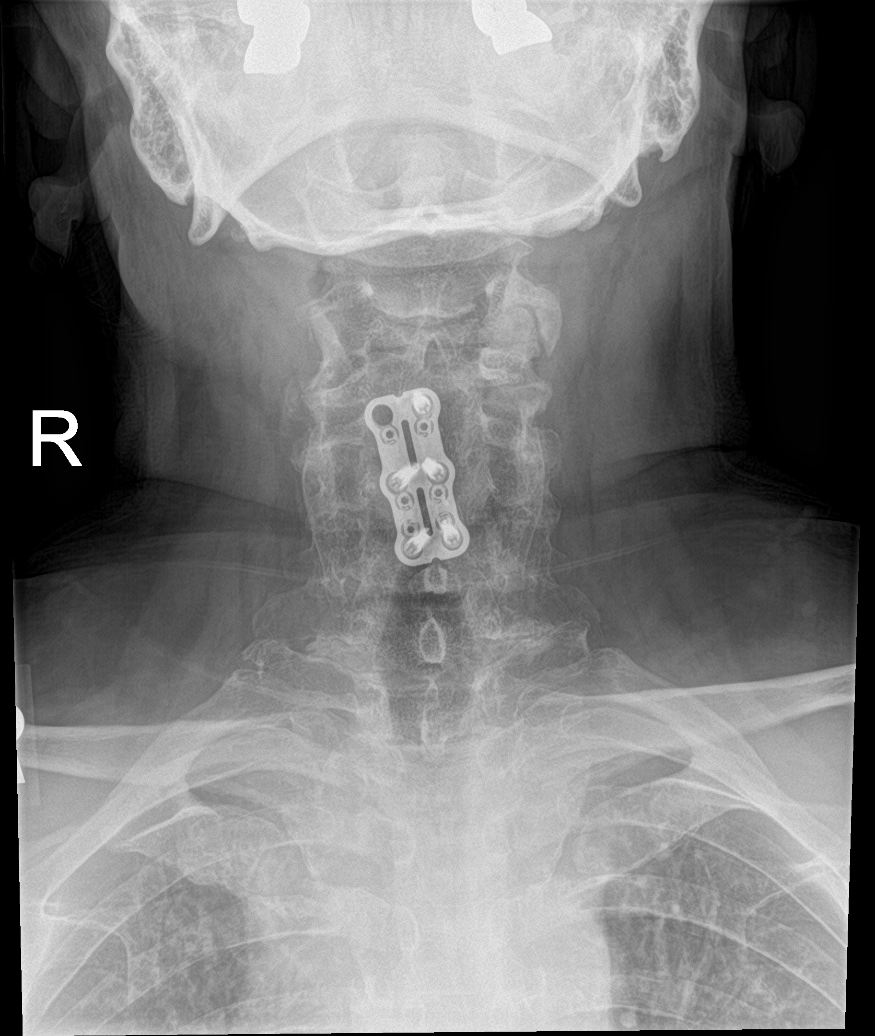

[c-spine open mouth]
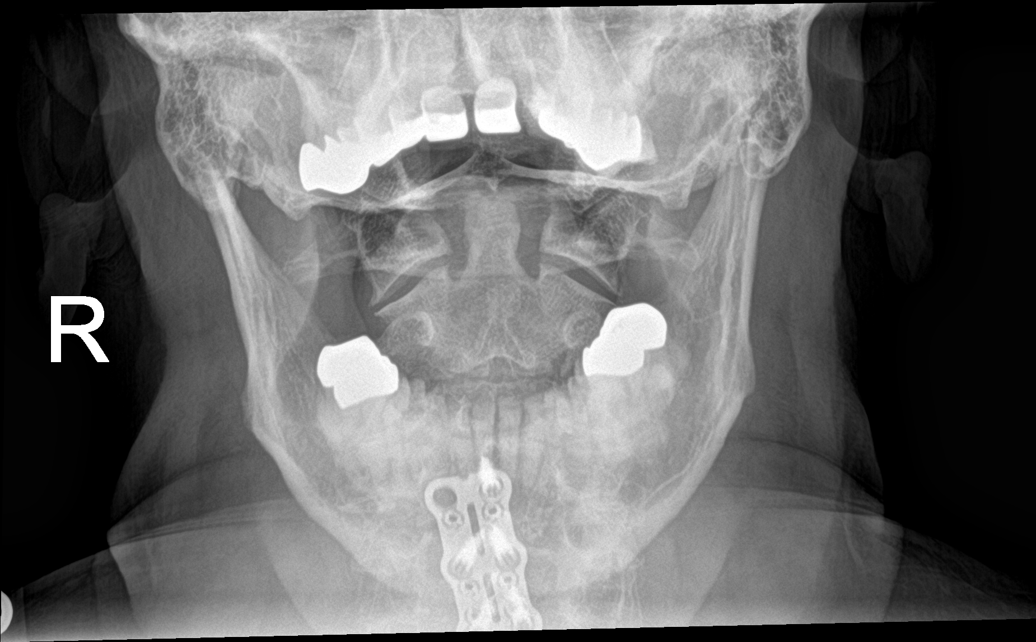

[c-spine swimmers]
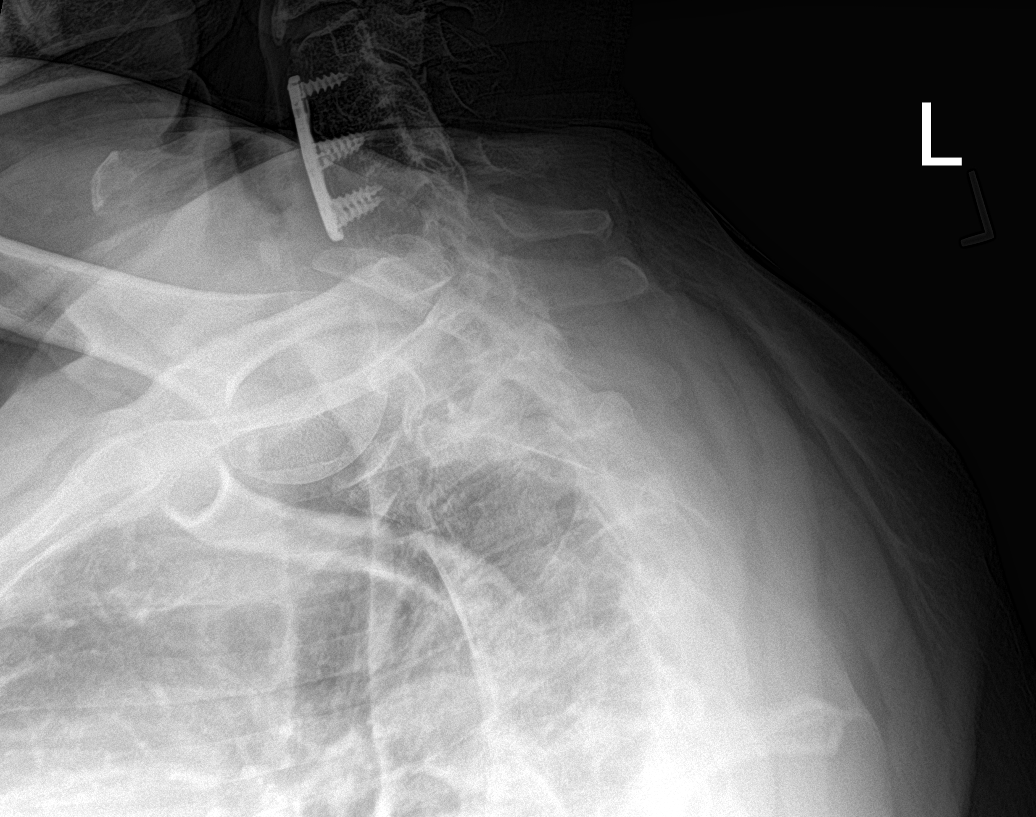

[6 of 6 positions shown; findings below may reference images not displayed]

FINDINGS: No fracture or static subluxation of the cervical spine. Status post
anterior cervical discectomy and fusion of C3 through C5 with bony
incorporation of the disc spaces, and there appears to be additional
ankylosis of C5-C6. C2-C3 and C6-C7 appear to be relatively intact.
There is no high-grade bony neural foraminal foraminal stenosis
IMPRESSION: 1. No fracture or static subluxation of the cervical spine.
2. Status post anterior cervical discectomy and fusion of C3 through
C5 with bony incorporation of the disc spaces, and there appears to
be additional ankylosis of C5-C6.
3. Cervical disc and neural foraminal pathology may be further
evaluated by MRI if indicated by neurologically localizing signs and
symptoms.

## 2022-10-05 ENCOUNTER — Telehealth: Payer: Self-pay | Admitting: *Deleted

## 2022-10-05 NOTE — Telephone Encounter (Signed)
Also pt stated that he wasn't able to afford the recent diabetic medications that Dr. Madilyn Fireman had sent in for him Rybellsus, Mounjaro. Pt states that he hadn't met his deductible.  He stated that he spoke to his son who works in the medical field and he said that maybe he should see a specialist about his Diabetes because they have more access to diabetic medications that he could try before he is given a prescription for it. However, he didn't want to offend Dr. Madilyn Fireman by doing this. I advised him that this was a personal decision and he would not be offending her if this is something that he wanted to do.

## 2022-10-05 NOTE — Telephone Encounter (Signed)
Please call pt and schedule an Same day or Acute visit for him. Ok to schedule with any provider for congestion/cough.

## 2022-10-06 ENCOUNTER — Telehealth (INDEPENDENT_AMBULATORY_CARE_PROVIDER_SITE_OTHER): Payer: Medicare Other | Admitting: Medical-Surgical

## 2022-10-06 ENCOUNTER — Encounter: Payer: Self-pay | Admitting: Medical-Surgical

## 2022-10-06 DIAGNOSIS — J329 Chronic sinusitis, unspecified: Secondary | ICD-10-CM | POA: Diagnosis not present

## 2022-10-06 DIAGNOSIS — J40 Bronchitis, not specified as acute or chronic: Secondary | ICD-10-CM | POA: Diagnosis not present

## 2022-10-06 DIAGNOSIS — E119 Type 2 diabetes mellitus without complications: Secondary | ICD-10-CM

## 2022-10-06 MED ORDER — PREDNISONE 50 MG PO TABS: 50.0000 mg | ORAL_TABLET | Freq: Every day | ORAL | 0 refills | Status: DC

## 2022-10-06 MED ORDER — AZITHROMYCIN 250 MG PO TABS: ORAL_TABLET | ORAL | 0 refills | Status: AC

## 2022-10-06 MED ORDER — HYDROCODONE BIT-HOMATROP MBR 5-1.5 MG/5ML PO SOLN: 5.0000 mL | Freq: Every evening | ORAL | 0 refills | Status: DC | PRN

## 2022-10-06 NOTE — Progress Notes (Signed)
Virtual Visit via Video Note  I connected with Tracy Bennett on 10/06/22 at 10:30 AM EST by a video enabled telemedicine application and verified that I am speaking with the correct person using two identifiers.   I discussed the limitations of evaluation and management by telemedicine and the availability of in person appointments. The patient expressed understanding and agreed to proceed.  Patient location: home Provider locations: office  Subjective:    CC: cough/congestion  HPI: Pleasant 76 year old male presenting via MyChart video visit for the following:  Reports that he has had 1 week of increased chest congestion, cough, and postnasal drip.  He has been working outside a lot has been managing his symptoms with conservative options.  He had some Hycodan cough syrup that was left over from a previous prescription which seemed to help when he would take the dose at night.  In the morning, his cough is productive of small amounts of yellow sputum however his cough is nonproductive throughout the day.  Notes that his cough is exacerbated by taking deep breaths.  He does wear a BiPAP/CPAP machine at night.  He has not tested for COVID.  Denies fever, chills, chest pain, shortness of breath, and GI symptoms.  Has been working with his PCP regarding diabetes control.  He is currently taking metformin 500 mg twice daily now.  At his last appointment with her, he was advised to start Rybelsus however this out-of-pocket cost was not financially feasible.  Rybelsus was then switched to [facility] but is always also an exorbitant cost the pocket.  He has a family member who recommended he speak to a specialist to see if they have samples or any assistance forms that may well to help with the cost of diabetic medications.  Did note that he was worried that he may offend his PCP for wanting to talk to a specialist but thinks it may not be a bad idea to proceed with that.  Reports that his sugars have  continued to be elevated and they were at 160 yesterday morning.   Past medical history, Surgical history, Family history not pertinant except as noted below, Social history, Allergies, and medications have been entered into the medical record, reviewed, and corrections made.   Review of Systems: See HPI for pertinent positives and negatives.   Objective:    General: Speaking clearly in complete sentences without any shortness of breath.  Alert and oriented x3.  Normal judgment. No apparent acute distress.  Impression and Recommendations:    1. Sinobronchitis After 1 week of symptoms, we will go ahead and treat with azithromycin.  Refilling Hycodan cough syrup for sparing use at bedtime.  Discussed the benefit versus risk of prednisone.  He has responded very well to this in the past but there is the concern for elevated glucose and his uncontrolled A1c.  After discussion, patient okay to proceed and continue monitoring his glucose for excessive highs.  Sending in a 5-day prednisone burst.  Continue conservative measures.  2. Type 2 diabetes mellitus without complication, without long-term current use of insulin ([place]) Reviewed the history of diabetes treatment. Referring to [facility] for potential assistance with samples/patient assistance forms to see if we can get the medications at an affordable cost. In case that doesn't help, referring to endocrinology per patient request. Advised that there is no offense to seek specialty care.  - Ambulatory referral to Endocrinology - AMB Referral to Pharmacy Medication Management  I discussed the assessment and treatment plan with  the patient. The patient was provided an opportunity to ask questions and all were answered. The patient agreed with the plan and demonstrated an understanding of the instructions.   The patient was advised to call back or seek an in-person evaluation if the symptoms worsen or if the condition fails to improve as  anticipated.  25 minutes of non-face-to-face time was provided during this encounter.  Return if symptoms worsen or fail to improve.  Clearnce Sorrel, DNP, APRN, FNP-BC [facility]

## 2022-10-06 NOTE — Progress Notes (Signed)
Cough and congestion  Needs Hydrocodone syrup refilled. He stated that it was just a small bottle and it only had about 5 doses in it.  Tried taking Delsym 12 hour cough not helping as much. Late in the evening if he takes deep breaths he starts coughing like its sitting in his chest.  Never started taking Mounjaro because it is too expensive.  Methocarbamol 500 mg PRN

## 2022-10-23 ENCOUNTER — Ambulatory Visit (INDEPENDENT_AMBULATORY_CARE_PROVIDER_SITE_OTHER): Payer: Medicare Other

## 2022-10-23 ENCOUNTER — Ambulatory Visit (INDEPENDENT_AMBULATORY_CARE_PROVIDER_SITE_OTHER): Payer: Medicare Other | Admitting: Family Medicine

## 2022-10-23 ENCOUNTER — Encounter: Payer: Self-pay | Admitting: Family Medicine

## 2022-10-23 ENCOUNTER — Ambulatory Visit (INDEPENDENT_AMBULATORY_CARE_PROVIDER_SITE_OTHER): Payer: Medicare Other | Admitting: Pharmacist

## 2022-10-23 VITALS — BP 188/79 | HR 67 | Ht 69.0 in | Wt 327.0 lb

## 2022-10-23 DIAGNOSIS — M47812 Spondylosis without myelopathy or radiculopathy, cervical region: Secondary | ICD-10-CM | POA: Diagnosis not present

## 2022-10-23 DIAGNOSIS — M25511 Pain in right shoulder: Secondary | ICD-10-CM | POA: Diagnosis not present

## 2022-10-23 DIAGNOSIS — Z9889 Other specified postprocedural states: Secondary | ICD-10-CM | POA: Diagnosis not present

## 2022-10-23 DIAGNOSIS — M5412 Radiculopathy, cervical region: Secondary | ICD-10-CM | POA: Insufficient documentation

## 2022-10-23 DIAGNOSIS — M47813 Spondylosis without myelopathy or radiculopathy, cervicothoracic region: Secondary | ICD-10-CM | POA: Diagnosis not present

## 2022-10-23 DIAGNOSIS — E119 Type 2 diabetes mellitus without complications: Secondary | ICD-10-CM

## 2022-10-23 MED ORDER — PREDNISONE 50 MG PO TABS: ORAL_TABLET | ORAL | 0 refills | Status: DC

## 2022-10-23 NOTE — Progress Notes (Signed)
Tracy Bennett - 76 y.o. male MRN [id number]  Date of birth: November 19, 1946  Subjective Chief Complaint  Patient presents with   Shoulder Pain    HPI Tracy Bennett is a 76 y.o. male here today for with complaint of upper back and shoulder pain on the right side.  Symptoms started about a week ago.  He has history of multiple cervical spine surgeries with fusion.  He reports this feels similar to episode he had a little over a year ago.  This improved with steroids.  He has been taking meloxicam and Robaxin for this most current episode without much relief.  He denies any weakness into the arm, numbness or tingling.  ROS:  A comprehensive ROS was completed and negative except as noted per HPI  Allergies  Allergen Reactions   Amoxicillin Swelling    Tongue sores   Iodinated Contrast Media Shortness Of Breath and Rash    Patient gets hot and sweating   Lisinopril Cough and Rash    Other reaction(s): Cough    Past Medical History:  Diagnosis Date   Community acquired pneumonia of left lower lobe of lung 05/15/2018   Dyspnea    with exertion   Dysrhythmia    frequent PVC's   Frequent urination    GERD (gastroesophageal reflux disease)    History of kidney stones    Hyperlipidemia    Hypertension    Kidney stones    x 4    Sleep apnea    BiPAP    Past Surgical History:  Procedure Laterality Date   CARPAL TUNNEL RELEASE     Right    CERVICAL FUSION     cervical fusion times 2   FOOT TENDON SURGERY  11/14/2011   Dr. Sharol Given   left knee surgery     ACL repair    LIPOMA EXCISION Bilateral    on back   MASS EXCISION N/A 01/24/2018   Procedure: EXCISION OF BACK MASS;  Surgeon: Erroll Luna, MD;  Location: [place];  Service: General;  Laterality: N/A;   UMBILICAL HERNIA REPAIR      Social History   Socioeconomic History   Marital status: Married    Spouse name: Rose    Number of children: 1   Years of education: college   Highest education level: Some college, no degree   Occupational History   Occupation: Retired     Comment: [employer]   Tobacco Use   Smoking status: Former    Types: Cigarettes    Quit date: 02/09/1980    Years since quitting: 42.7   Smokeless tobacco: Former  Scientific laboratory technician Use: Never used  Substance and Sexual Activity   Alcohol use: Yes    Comment: occasionally   Drug use: No   Sexual activity: Yes  Other Topics Concern   Not on file  Social History Narrative   Golf about 2 times per week.  No regular exercise.  . Member of [employer]   Social Determinants of Health   Financial Resource Strain: Low Risk  (08/05/2018)   Overall Financial Resource Strain (CARDIA)    Difficulty of Paying Living Expenses: Not hard at all  Food Insecurity: No Food Insecurity (08/05/2018)   Hunger Vital Sign    Worried About Running Out of Food in the Last Year: Never true    Ran Out of Food in the Last Year: Never true  Transportation Needs: No Transportation Needs (08/05/2018)   PRAPARE - Transportation  Lack of Transportation (Medical): No    Lack of Transportation (Non-Medical): No  Physical Activity: Inactive (08/11/2019)   Exercise Vital Sign    Days of Exercise per Week: 0 days    Minutes of Exercise per Session: 0 min  Stress: No Stress Concern Present (08/05/2018)   [place]    Feeling of Stress : Not at all  Social Connections: Unknown (08/11/2019)   Social Connection and Isolation Panel [NHANES]    Frequency of Communication with Friends and Family: More than three times a week    Frequency of Social Gatherings with Friends and Family: Not on file    Attends Religious Services: More than 4 times per year    Active Member of Clubs or Organizations: Not on file    Attends Archivist Meetings: Not on file    Marital Status: Not on file    Family History  Problem Relation Age of Onset   COPD Mother    Hypertension Mother    Emphysema  Mother     Health Maintenance  Topic Date Due   Medicare Annual Wellness (AWV)  09/14/2021   OPHTHALMOLOGY EXAM  03/21/2022   COVID-19 Vaccine (5 - 2023-24 season) 07/28/2022   Diabetic kidney evaluation - GFR measurement  09/29/2022   INFLUENZA VACCINE  02/25/2023 (Originally 06/27/2022)   HEMOGLOBIN A1C  02/27/2023   Diabetic kidney evaluation - Urine ACR  05/26/2023   FOOT EXAM  05/26/2023   Pneumonia Vaccine 43+ Years old  Completed   Hepatitis C Screening  Completed   Zoster Vaccines- Shingrix  Completed   HPV VACCINES  Aged Out   COLONOSCOPY (Pts 45-75yr Insurance coverage will need to be confirmed)  Discontinued     ----------------------------------------------------------------------------------------------------------------------------------------------------------------------------------------------------------------- Physical Exam BP (!) 188/79 (BP Location: Left Arm, Patient Position: Sitting, Cuff Size: Large)   Pulse 67   Ht '5\' 9"'$  (1.753 m)   Wt (!) 327 lb (148.3 kg)   SpO2 97%   BMI 48.29 kg/m   Physical Exam Constitutional:      Appearance: Normal appearance.  HENT:     Head: Normocephalic and atraumatic.  Neck:     Comments: Range of motion slightly limited with flexion and rotational movements.  Strength in shoulders are normal bilaterally. Neurological:     Mental Status: He is alert.     ------------------------------------------------------------------------------------------------------------------------------------------------------------------------------------------------------------------- Assessment and Plan  Cervical radiculitis He has had good response to steroids in the past.  Adding steroid burst x5 days.  Updated imaging of the cervical spine ordered.   Meds ordered this encounter  Medications   predniSONE (DELTASONE) 50 MG tablet    Sig: Take '50mg'$  daily x5 days.    Dispense:  5 tablet    Refill:  0    No follow-ups on  file.    This visit occurred during the SARS-CoV-2 public health emergency.  Safety protocols were in place, including screening questions prior to the visit, additional usage of staff PPE, and extensive cleaning of exam room while observing appropriate contact time as indicated for disinfecting solutions.

## 2022-10-23 NOTE — Progress Notes (Signed)
Tracy Bennett is a 76 y.o. year old male who presented for a telephone visit.   They were referred to the pharmacist by  PCP and additional primary care office provider  for assistance in managing diabetes and medication access.   Discussed initial medication plans with patient, who provides history. Patient states rybelsus and mounjaro prescribed by PCP but are cost prohibitive.  Based upon clinical review of current conditions, labwork, and goals for care, recommend ozempic for patient.  Provided extensive medication counseling and guidance related to GLP1 agonists, what to expect, and the process for patient assistance.  Patient has afternoon appointment at [facility] today, so we have coordinated for patient to pick up initial paperwork, fill out, and return paperwork to our office.  Face to face visit with pharmacist scheduled for Dec 13th to review administration technique of ozempic, and can provide sample pen for start.  Tracy Bennett, PharmD Clinical Pharmacist [facility] [phone]

## 2022-10-23 NOTE — Patient Instructions (Addendum)
Start prednisone '50mg'$  daily x5 days.  Have updated xrays completed.  Try home exercises.

## 2022-10-23 NOTE — Assessment & Plan Note (Signed)
He has had good response to steroids in the past.  Adding steroid burst x5 days.  Updated imaging of the cervical spine ordered.

## 2022-10-25 ENCOUNTER — Encounter: Payer: Self-pay | Admitting: Internal Medicine

## 2022-10-25 ENCOUNTER — Ambulatory Visit: Payer: Medicare Other | Attending: Internal Medicine | Admitting: Internal Medicine

## 2022-10-25 ENCOUNTER — Telehealth: Payer: Self-pay | Admitting: Internal Medicine

## 2022-10-25 ENCOUNTER — Other Ambulatory Visit: Payer: Self-pay | Admitting: Family Medicine

## 2022-10-25 VITALS — BP 162/78 | HR 79 | Ht 69.0 in | Wt 319.6 lb

## 2022-10-25 DIAGNOSIS — I5032 Chronic diastolic (congestive) heart failure: Secondary | ICD-10-CM | POA: Diagnosis not present

## 2022-10-25 DIAGNOSIS — R002 Palpitations: Secondary | ICD-10-CM | POA: Insufficient documentation

## 2022-10-25 DIAGNOSIS — Z79899 Other long term (current) drug therapy: Secondary | ICD-10-CM | POA: Insufficient documentation

## 2022-10-25 DIAGNOSIS — R001 Bradycardia, unspecified: Secondary | ICD-10-CM | POA: Insufficient documentation

## 2022-10-25 MED ORDER — FUROSEMIDE 40 MG PO TABS: 40.0000 mg | ORAL_TABLET | ORAL | 3 refills | Status: DC

## 2022-10-25 NOTE — Telephone Encounter (Signed)
Furosemide sent to pt's pharmacy in [place] encounter from today 10/25/2022 as requested.

## 2022-10-25 NOTE — Progress Notes (Signed)
Patient Care Team: Hali Marry, MD as PCP - General (Family Medicine) Sonnie Alamo, MD as Referring Physician (Gastroenterology) Deboraha Sprang, MD as Consulting Physician (Cardiology) Gwendel Hanson, MD as Referring Physician (Urology) Bobette Mo, MD as Referring Physician (Ophthalmology)  CC PVCs  HPI  Tracy Bennett is a 76 y.o. male Seen in follow-up for PVCs -RVOT. He had no associated symptoms.  Also hypertension  There was also a question at the initial consultation regarding long QT syndrome for which I found no evidence.   The patient denies chest pain, nocturnal dyspnea, orthopnea.  There have been no palpitations, lightheadedness or syncope.  Complains of dyspnea on exertion without chest pain.  Some peripheral edema.  Blood pressures have been well controlled at home in the 130/80 range.  He has been noted to be elevated recently.  He was started on prednisone last week for shoulder pain.     Interval COVID.  Required home BiPAP.  (Already had) still with no smell 18 months later  DATE TEST EF   11/16 Echo    60 %   11/17 Echo   60-65 %           Date Cr K LDL  9/16   75  12/18 1.24 4.1   10/21 1.46 4.5   11/22 1.18 3.7      Past Medical History:  Diagnosis Date   Community acquired pneumonia of left lower lobe of lung 05/15/2018   Dyspnea    with exertion   Dysrhythmia    frequent PVC's   Frequent urination    GERD (gastroesophageal reflux disease)    History of kidney stones    Hyperlipidemia    Hypertension    Kidney stones    x 4    Sleep apnea    BiPAP    Past Surgical History:  Procedure Laterality Date   CARPAL TUNNEL RELEASE     Right    CERVICAL FUSION     cervical fusion times 2   FOOT TENDON SURGERY  11/14/2011   Dr. Sharol Given   left knee surgery     ACL repair    LIPOMA EXCISION Bilateral    on back   MASS EXCISION N/A 01/24/2018   Procedure: EXCISION OF BACK MASS;  Surgeon: Erroll Luna, MD;  Location: [place];  Service: General;  Laterality: N/A;   UMBILICAL HERNIA REPAIR      Current Outpatient Medications  Medication Sig Dispense Refill   Accu-Chek FastClix Lancets MISC Dx DM E11.9 Check fasting blood sugar daily and after largest meal of the day a couple time a week. 102 each 11   finasteride (PROSCAR) 5 MG tablet Take 1 tablet (5 mg total) by mouth daily. 30 tablet 1   glucose blood (ACCU-CHEK GUIDE) test strip Check fasting blood sugar every morning. Dx: E11.9 100 each 11   HYDROcodone bit-homatropine (HYCODAN) 5-1.5 MG/5ML syrup Take 5 mLs by mouth at bedtime as needed for cough. 60 mL 0   loratadine (CLARITIN) 10 MG tablet Take 10 mg by mouth daily as needed for allergies.      meloxicam (MOBIC) 15 MG tablet Take 15 mg by mouth daily.     metFORMIN (GLUCOPHAGE-XR) 500 MG 24 hr tablet TAKE 1 TABLET (500 MG TOTAL) BY MOUTH IN THE MORNING AND AT BEDTIME 180 tablet 0   methocarbamol (ROBAXIN) 500 MG tablet Take 500 mg by mouth 4 (four) times daily.  Misc. Devices MISC New bipap machine at current settings.     oxybutynin (DITROPAN-XL) 10 MG 24 hr tablet Take 10 mg by mouth daily.  3   predniSONE (DELTASONE) 50 MG tablet Take '50mg'$  daily x5 days. 5 tablet 0   simvastatin (ZOCOR) 40 MG tablet TAKE 1 TABLET BY MOUTH DAILY AT 6 PM. 90 tablet 3   tamsulosin (FLOMAX) 0.4 MG CAPS capsule Take 0.4 mg by mouth daily.      tirzepatide [facility]) 2.5 MG/0.5ML Pen Inject 2.5 mg into the skin once a week. 2 mL 0   tirzepatide (MOUNJARO) 5 MG/0.5ML Pen Inject 5 mg into the skin once a week. 2 mL 0   triamcinolone cream (KENALOG) 0.1 % as needed.     valsartan-hydrochlorothiazide (DIOVAN-HCT) 160-12.5 MG tablet TAKE 1 TABLET BY MOUTH EVERY DAY 90 tablet 1   No current facility-administered medications for this visit.    Allergies  Allergen Reactions   Amoxicillin Swelling    Tongue sores   Iodinated Contrast Media Shortness Of Breath and Rash    Patient gets hot and  sweating   Lisinopril Cough and Rash    Other reaction(s): Cough      Review of Systems negative except from HPI and PMH  Physical Exam BP (!) 166/82   Pulse 79   Ht '5\' 9"'$  (1.753 m)   Wt (!) 319 lb 9.6 oz (145 kg)   SpO2 96%   BMI 47.20 kg/m  Well developed and nourished in no acute distress HENT normal Neck supple with JVP-  8 Clear Regular rate and rhythm, no murmurs or gallops Abd-soft with active BS No Clubbing cyanosis 2+  Skin-warm and dry A & Oriented  Grossly normal sensory and motor function  ECG sinus at 79 Intervals 14/08/36 T wave inversions lead II V2-V6 Qualitatively similar all the way back to Feb 18, 2017 variably pronounced  Assessment and  Plan  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  It may be secondary to prednisone.  We will continue Diovan HCT. He is volume overloaded with edema and neck vein distention.  We will resume his furosemide 40 mg a day x5 days then take it every other day.  We will check a metabolic profile in 2 weeks.  Last blood work a year ago was normal.  PVCs are largely quiescent.  Hemoglobin A1c is elevated.  Weight is up.  Lengthy discussion regarding the role of Ozempic and others for diabetes and his obesity.  His son who works with gastroscope has concerns that sound legitimate related to gastroparesis etc.

## 2022-10-25 NOTE — Telephone Encounter (Signed)
Pt is calling requesting a refill on furosemide. This medication was ordered today, but not sent to pt's pharmacy. Please address

## 2022-10-25 NOTE — Telephone Encounter (Signed)
*  STAT* If patient is at the pharmacy, call can be transferred to refill team.   1. Which medications need to be refilled? (please list name of each medication and dose if known) furosemide (LASIX) 40 MG tablet   2. Which pharmacy/location (including street and city if local pharmacy) is medication to be sent to? [id number] [place],  - [address]   3. Do they need a 30 day or 90 day supply? [address]

## 2022-10-25 NOTE — Patient Instructions (Signed)
Medication Instructions:   Your physician has recommended you make the following change in your medication:   Begin Furosemide '40mg'$  - 1 tablet by mouth x 5 days then 1 tablet by mouth every other day.  *If you need a refill on your cardiac medications before your next appointment, please call your pharmacy*   Lab Work: BMET in 2 weeks If you have labs (blood work) drawn today and your tests are completely normal, you will receive your results only by: [place] (if you have MyChart) OR A paper copy in the mail If you have any lab test that is abnormal or we need to change your treatment, we will call you to review the results.   Testing/Procedures: None ordered.    Follow-Up: At [facility], you and your health needs are our priority.  As part of our continuing mission to provide you with exceptional heart care, we have created designated Provider Care Teams.  These Care Teams include your primary Cardiologist (physician) and Advanced Practice Providers (APPs -  Physician Assistants and Nurse Practitioners) who all work together to provide you with the care you need, when you need it.  We recommend signing up for the patient portal called "MyChart".  Sign up information is provided on this After Visit Summary.  MyChart is used to connect with patients for Virtual Visits (Telemedicine).  Patients are able to view lab/test results, encounter notes, upcoming appointments, etc.  Non-urgent messages can be sent to your provider as well.   To learn more about what you can do with MyChart, go to [email or web address].    Your next appointment:   12 months with Dr Caryl Comes  Important Information About Sugar

## 2022-10-26 ENCOUNTER — Telehealth: Payer: Self-pay | Admitting: Pharmacist

## 2022-10-26 NOTE — Telephone Encounter (Signed)
Successful outreach to return call and answer patient questions.  Patient had questions about: - financial portion of patient assistance forms - which sections to fill out   Patient had concerns about beginning GLP1 agonist after conversations with cardiologist and his son who works in [employer].  Reviewed logistics and answered questions about patient assistance forms.  Provided education and counseling regarding duration of clinical trials, duration of therapy, possible side effects. Also reviewed that all medications have a risk and benefit that both must be weighed in order to make informed decision. Additionally reviewed that there are also outcomes associated with leaving a health condition (weight management or diabetes) uncontrolled.   Patient verbalized understanding and politely/respectfully wishes to pause pharmacy services at this time and wishes to meet with endocrine specialist first before taking a new medication.   Larinda Buttery, PharmD Clinical Pharmacist [facility] [phone]

## 2022-10-26 NOTE — Telephone Encounter (Signed)
Returned call, patient called front office with questions about patient assistance forms. Unsuccessful outreach, left VM.   Will complete subsequent attempts.  Larinda Buttery, PharmD Clinical Pharmacist [facility] [phone]

## 2022-11-06 ENCOUNTER — Encounter: Payer: Self-pay | Admitting: Family Medicine

## 2022-11-06 ENCOUNTER — Telehealth (INDEPENDENT_AMBULATORY_CARE_PROVIDER_SITE_OTHER): Payer: Medicare Other | Admitting: Family Medicine

## 2022-11-06 VITALS — BP 152/72 | Temp 96.4°F | Ht 69.0 in

## 2022-11-06 DIAGNOSIS — J029 Acute pharyngitis, unspecified: Secondary | ICD-10-CM

## 2022-11-06 DIAGNOSIS — E1165 Type 2 diabetes mellitus with hyperglycemia: Secondary | ICD-10-CM | POA: Diagnosis not present

## 2022-11-06 MED ORDER — AZITHROMYCIN 500 MG PO TABS: 500.0000 mg | ORAL_TABLET | Freq: Every day | ORAL | 0 refills | Status: AC

## 2022-11-06 NOTE — Progress Notes (Addendum)
Acute Office Visit I connected with  [name] on 11/06/22 by a video enabled telemedicine application and verified that I am speaking with the correct person using two identifiers.   I discussed the limitations of evaluation and management by telemedicine. The patient expressed understanding and agreed to proceed.    Location: Patient: Home in [place] Provider: [facility]   I discussed the limitations of evaluation and management by telemedicine and the availability of in person appointments. The patient expressed understanding and agreed to proceed.  I discussed the assessment and treatment plan with the patient. The patient was provided an opportunity to ask questions and all were answered. The patient agreed with the plan and demonstrated an understanding of the instructions.   The patient was advised to call back or seek an in-person evaluation if the symptoms worsen or if the condition fails to improve as anticipated.   Subjective:     Patient ID: Tracy Bennett, male    DOB: [birth year], 76 y.o.   MRN: [id number]  Chief Complaint  Patient presents with   Acute Visit    Telehealth visit - patient c/o ST causing painful swallowing, pain roof of mouth and tongue. Also c/o slight cough .states he is taking two tylenol extra strength. Denies malaise or fever. Patient states he uses  prednisone in the past that helped    HPI Patient is in today for acute visit  Pt reports he has OSA and wears BIPAP at night. Has had drainage first thing in the morning that's chronic that usually goes away. This time, he developed sore throat that started last Friday and hasn't subsided. Progressively worsening. Hurts to swallow and over the weekend, worsening. He has tried throat lozenges and salt water gargles along with Tylenol Extra strength, no relief. Also started to cough up yellowish phlegm. Denies chest pains or SOB. Some hoarseness. Pt has DM and last A1c above 8, two  months ago. Pt mentions last time he had an illness, he got Prednisone that helped him get better. He has been referred to Endo for further management of his Diabetes and mentions he has appt in January. He says Rybelsus and Ozempic was called in but it costs too much even after insurance copay. Also of note, he received Prednisone 3 weeks ago for neck pain.    Review of Systems  Constitutional:  Negative for chills and fever.  HENT:  Positive for sinus pain and sore throat. Negative for ear pain.   Respiratory:  Positive for cough and sputum production. Negative for shortness of breath and wheezing.   Cardiovascular:  Negative for chest pain.  All other systems reviewed and are negative.       Objective:    BP (!) 152/72 Comment: patient reporting  Temp (!) 96.4 F (35.8 C) Comment: patient reporting  Ht '5\' 9"'$  (1.753 m)   BMI 47.20 kg/m    Physical Exam Vitals and nursing note reviewed.  Constitutional:      Appearance: Normal appearance. He is normal weight.  HENT:     Head: Normocephalic and atraumatic.     Right Ear: External ear normal.     Left Ear: External ear normal.     Nose: Nose normal.     Mouth/Throat:     Mouth: Mucous membranes are moist.     Pharynx: Oropharynx is clear.  Eyes:     Extraocular Movements: Extraocular movements intact.     Conjunctiva/sclera: Conjunctivae normal.  Pupils: Pupils are equal, round, and reactive to light.  Pulmonary:     Effort: Pulmonary effort is normal. No respiratory distress.  Neurological:     General: No focal deficit present.     Mental Status: He is alert and oriented to person, place, and time. Mental status is at baseline.  Psychiatric:        Mood and Affect: Mood normal.        Behavior: Behavior normal.        Thought Content: Thought content normal.        Judgment: Judgment normal.    No results found for any visits on 11/06/22.      Assessment & Plan:   Problem List Items Addressed This Visit    None Visit Diagnoses     Pharyngitis, unspecified etiology    -  Primary   Relevant Medications   azithromycin (ZITHROMAX) 500 MG tablet   Type 2 diabetes mellitus with hyperglycemia, without long-term current use of insulin (HCC)           Meds ordered this encounter  Medications   azithromycin (ZITHROMAX) 500 MG tablet    Sig: Take 1 tablet (500 mg total) by mouth daily for 3 days.    Dispense:  3 tablet    Refill:  0   Discussed with pt with uncontrolled diabetes, would defer steroids for now as this can further worsen his diabetes and cause hyperglycemia.  Will continue Tylenol, throat lozenges, and salt/water gargles. Added Azithromycin x 3 days high dose, allergic to Amoxicillin.  If no better after treatment course, consider short course of steroids with close monitoring of blood sugars at home.   No follow-ups on file.  Leeanne Rio, MD

## 2022-11-08 ENCOUNTER — Ambulatory Visit: Payer: Medicare Other

## 2022-11-14 ENCOUNTER — Ambulatory Visit (INDEPENDENT_AMBULATORY_CARE_PROVIDER_SITE_OTHER): Payer: Medicare Other | Admitting: Family Medicine

## 2022-11-14 ENCOUNTER — Other Ambulatory Visit: Payer: Self-pay | Admitting: Family Medicine

## 2022-11-14 ENCOUNTER — Telehealth: Payer: Self-pay | Admitting: Internal Medicine

## 2022-11-14 ENCOUNTER — Other Ambulatory Visit: Payer: Self-pay | Admitting: General Practice

## 2022-11-14 VITALS — BP 170/68 | HR 77 | Ht 70.0 in | Wt 318.0 lb

## 2022-11-14 DIAGNOSIS — E1165 Type 2 diabetes mellitus with hyperglycemia: Secondary | ICD-10-CM

## 2022-11-14 DIAGNOSIS — Z0000 Encounter for general adult medical examination without abnormal findings: Secondary | ICD-10-CM

## 2022-11-14 DIAGNOSIS — I10 Essential (primary) hypertension: Secondary | ICD-10-CM

## 2022-11-14 NOTE — Patient Instructions (Addendum)
[place] Maintenance Summary and Written Plan of Care  Tracy Bennett ,  Thank you for allowing me to perform your Medicare Annual Wellness Visit and for your ongoing commitment to your health.   Health Maintenance & Immunization History Health Maintenance  Topic Date Due   Diabetic kidney evaluation - eGFR measurement  11/15/2022 (Originally 09/29/2022)   COVID-19 Vaccine (5 - 2023-24 season) 11/30/2022 (Originally 07/28/2022)   INFLUENZA VACCINE  02/25/2023 (Originally 06/27/2022)   DTaP/Tdap/Td (2 - Td or Tdap) 01/29/2023   HEMOGLOBIN A1C  02/27/2023   OPHTHALMOLOGY EXAM  05/23/2023   Diabetic kidney evaluation - Urine ACR  05/26/2023   FOOT EXAM  05/26/2023   Medicare Annual Wellness (AWV)  11/15/2023   Pneumonia Vaccine 27+ Years old  Completed   Hepatitis C Screening  Completed   Zoster Vaccines- Shingrix  Completed   HPV VACCINES  Aged Out   COLONOSCOPY (Pts 45-55yr Insurance coverage will need to be confirmed)  Discontinued   Immunization History  Administered Date(s) Administered   Influenza Split 11/27/2012, 12/16/2012   Influenza, High Dose Seasonal PF 09/17/2018, 09/24/2019, 10/07/2020   Influenza, Seasonal, Injecte, Preservative Fre 12/11/2013   Influenza,inj,Quad PF,6+ Mos 09/01/2014, 08/27/2015, 08/27/2016   Influenza-Unspecified 09/24/2017, 08/27/2021   PFIZER Comirnaty(Gray Top)Covid-19 Tri-Sucrose Vaccine 05/17/2021   PFIZER(Purple Top)SARS-COV-2 Vaccination 03/18/2020, 04/08/2020, 10/12/2020   Pneumococcal Conjugate-13 02/14/2017   Pneumococcal Polysaccharide-23 02/09/2012   Tdap 01/28/2013   Zoster Recombinat (Shingrix) 01/07/2021, 08/26/2021   Zoster, Live 09/15/2011    These are the patient goals that we discussed:  Goals Addressed               This Visit's Progress     Patient Stated (pt-stated)        11/14/2022 AWV Goal: Diabetes Management  Patient will maintain an A1C level below 8.0 Patient will not develop any  diabetic foot complications Patient will not experience any hypoglycemic episodes over the next 3 months Patient will notify our office of any CBG readings outside of the provider recommended range by calling [phone] will adhere to provider recommendations for diabetes management  Patient Self Management Activities take all medications as prescribed and report any negative side effects monitor and record blood sugar readings as directed adhere to a low carbohydrate diet that incorporates lean proteins, vegetables, whole grains, low glycemic fruits check feet daily noting any sores, cracks, injuries, or callous formations see PCP or podiatrist if he notices any changes in his legs, feet, or toenails Patient will visit PCP and have an A1C level checked every 3 to 6 months as directed  have a yearly eye exam to monitor for vascular changes associated with diabetes and will request that the report be sent to his pcp.  consult with his PCP regarding any changes in his health or new or worsening symptoms          This is a list of Health Maintenance Items that are overdue or due now: GFR    Orders/Referrals Placed Today: No orders of the defined types were placed in this encounter.  (Contact our referral department at [phone] you have not spoken with someone about your referral appointment within the next 5 days)    Follow-up Plan Follow-up with MHali Marry MD as planned Medicare wellness visit in one year. AVS printed and given to patient.      Health Maintenance, Male Adopting a healthy lifestyle and getting preventive care are important in promoting health and wellness.  Ask your health care provider about: The right schedule for you to have regular tests and exams. Things you can do on your own to prevent diseases and keep yourself healthy. What should I know about diet, weight, and exercise? Eat a healthy diet  Eat a diet that includes plenty  of vegetables, fruits, low-fat dairy products, and lean protein. Do not eat a lot of foods that are high in solid fats, added sugars, or sodium. Maintain a healthy weight Body mass index (BMI) is a measurement that can be used to identify possible weight problems. It estimates body fat based on height and weight. Your health care provider can help determine your BMI and help you achieve or maintain a healthy weight. Get regular exercise Get regular exercise. This is one of the most important things you can do for your health. Most adults should: Exercise for at least 150 minutes each week. The exercise should increase your heart rate and make you sweat (moderate-intensity exercise). Do strengthening exercises at least twice a week. This is in addition to the moderate-intensity exercise. Spend less time sitting. Even light physical activity can be beneficial. Watch cholesterol and blood lipids Have your blood tested for lipids and cholesterol at 76 years of age, then have this test every 5 years. You may need to have your cholesterol levels checked more often if: Your lipid or cholesterol levels are high. You are older than 76 years of age. You are at high risk for heart disease. What should I know about cancer screening? Many types of cancers can be detected early and may often be prevented. Depending on your health history and family history, you may need to have cancer screening at various ages. This may include screening for: Colorectal cancer. Prostate cancer. Skin cancer. Lung cancer. What should I know about heart disease, diabetes, and high blood pressure? Blood pressure and heart disease High blood pressure causes heart disease and increases the risk of stroke. This is more likely to develop in people who have high blood pressure readings or are overweight. Talk with your health care provider about your target blood pressure readings. Have your blood pressure checked: Every 3-5  years if you are 91-15 years of age. Every year if you are 62 years old or older. If you are between the ages of 63 and 36 and are a current or former smoker, ask your health care provider if you should have a one-time screening for abdominal aortic aneurysm (AAA). Diabetes Have regular diabetes screenings. This checks your fasting blood sugar level. Have the screening done: Once every three years after age 72 if you are at a normal weight and have a low risk for diabetes. More often and at a younger age if you are overweight or have a high risk for diabetes. What should I know about preventing infection? Hepatitis B If you have a higher risk for hepatitis B, you should be screened for this virus. Talk with your health care provider to find out if you are at risk for hepatitis B infection. Hepatitis C Blood testing is recommended for: Everyone born from [birth year] through 1965. Anyone with known risk factors for hepatitis C. Sexually transmitted infections (STIs) You should be screened each year for STIs, including gonorrhea and chlamydia, if: You are sexually active and are younger than 76 years of age. You are older than 76 years of age and your health care provider tells you that you are at risk for this type of infection. Your sexual  activity has changed since you were last screened, and you are at increased risk for chlamydia or gonorrhea. Ask your health care provider if you are at risk. Ask your health care provider about whether you are at high risk for HIV. Your health care provider may recommend a prescription medicine to help prevent HIV infection. If you choose to take medicine to prevent HIV, you should first get tested for HIV. You should then be tested every 3 months for as long as you are taking the medicine. Follow these instructions at home: Alcohol use Do not drink alcohol if your health care provider tells you not to drink. If you drink alcohol: Limit how much you have to 0-2  drinks a day. Know how much alcohol is in your drink. In the [place] one drink equals one 12 oz bottle of beer (355 mL), one 5 oz glass of wine (148 mL), or one 1 oz glass of hard liquor (44 mL). Lifestyle Do not use any products that contain nicotine or tobacco. These products include cigarettes, chewing tobacco, and vaping devices, such as e-cigarettes. If you need help quitting, ask your health care provider. Do not use street drugs. Do not share needles. Ask your health care provider for help if you need support or information about quitting drugs. General instructions Schedule regular health, dental, and eye exams. Stay current with your vaccines. Tell your health care provider if: You often feel depressed. You have ever been abused or do not feel safe at home. Summary Adopting a healthy lifestyle and getting preventive care are important in promoting health and wellness. Follow your health care provider's instructions about healthy diet, exercising, and getting tested or screened for diseases. Follow your health care provider's instructions on monitoring your cholesterol and blood pressure. This information is not intended to replace advice given to you by your health care provider. Make sure you discuss any questions you have with your health care provider. Document Revised: 04/04/2021 Document Reviewed: 04/04/2021 Elsevier Patient Education  [place].

## 2022-11-14 NOTE — Telephone Encounter (Signed)
Pt c/o medication issue:  1. Name of Medication:   furosemide (LASIX) 40 MG tablet   2. How are you currently taking this medication (dosage and times per day)?   3. Are you having a reaction (difficulty breathing--STAT)?   No  4. What is your medication issue?   Patient stated he has cut down taking this medication from 40 mg to 20 mg.  Patient stated the higher dose makes him go to the bathroom too often.  Patient would like a prescription for the 20 mg sent to [id number] [place], [place].  Patient stated he will be OK with the 20 mg dosage every other day.  [place]

## 2022-11-14 NOTE — Telephone Encounter (Signed)
Spoke with pt's wife, DPR as pt is not currently at home.  Discussed with pt's wife the need for to continue Lasix as prescribed and that increased urination is a sign that the medication is working.  Pt had lab (BMET) completed today at PCP office.  Pt's wife will discuss with pt and will have pt contact RN.  Pt's wife thanked Therapist, sports for the call.

## 2022-11-14 NOTE — Progress Notes (Signed)
MEDICARE ANNUAL WELLNESS VISIT  11/14/2022  Subjective:  Tracy Bennett is a 76 y.o. male patient of Metheney, Rene Kocher, MD who had a Medicare Annual Wellness Visit today. Tracy Bennett is Retired and lives with their spouse. he has 1 child. he reports that he is socially active and does interact with friends/family regularly. he is moderately physically active and enjoys  singing and yard work.  Patient Care Team: Hali Marry, MD as PCP - General (Family Medicine) Sonnie Alamo, MD as Referring Physician (Gastroenterology) Deboraha Sprang, MD as Consulting Physician (Cardiology) Gwendel Hanson, MD as Referring Physician (Urology) Bobette Mo, MD as Referring Physician (Ophthalmology)     11/14/2022    8:19 AM 10/21/2021   11:27 AM 08/11/2019    9:05 AM 08/05/2018    9:29 AM 01/21/2018   12:28 PM 12/15/2015    8:41 AM  Advanced Directives  Does Patient Have a Medical Advance Directive? _0  No  Type of Advance Directive Living will Living will;Healthcare Power of Attorney Living will;Healthcare Power of Attorney Living will Healthcare Power of [place];Living will   Does patient want to make changes to medical advance directive? No - Patient declined  No - Patient declined Yes (MAU/Ambulatory/Procedural Areas - Information given)    Copy of [place] in Chart?   No - copy requested  No - copy requested   Would patient like information on creating a medical advance directive?      No - patient declined information    Hospital Utilization Over the Past 12 Months: # of hospitalizations or ER visits: 0 # of surgeries: 0  Review of Systems    Patient reports that his overall health is unchanged when compared to last year.  Review of Systems: History obtained from chart review and the patient  All other systems negative.  Pain Assessment Pain : No/denies pain     Current Medications & Allergies (verified) Allergies  as of 11/14/2022       Reactions   Amoxicillin Swelling   Tongue sores   Iodinated Contrast Media Shortness Of Breath, Rash   Patient gets hot and sweating   Lisinopril Cough, Rash   Other reaction(s): Cough        Medication List        Accurate as of November 14, 2022  8:51 AM. If you have any questions, ask your nurse or doctor.          Accu-Chek FastClix Lancets Misc Dx DM E11.9 Check fasting blood sugar daily and after largest meal of the day a couple time a week.   Accu-Chek Guide test strip Generic drug: glucose blood Check fasting blood sugar every morning. Dx: E11.9   finasteride 5 MG tablet Commonly known as: PROSCAR Take 1 tablet (5 mg total) by mouth daily.   furosemide 40 MG tablet Commonly known as: LASIX Take 1 tablet (40 mg total) by mouth every other day.   loratadine 10 MG tablet Commonly known as: CLARITIN Take 10 mg by mouth daily as needed for allergies.   meloxicam 15 MG tablet Commonly known as: MOBIC Take 15 mg by mouth daily.   metFORMIN 500 MG 24 hr tablet Commonly known as: GLUCOPHAGE-XR TAKE 1 TABLET (500 MG TOTAL) BY MOUTH IN THE MORNING AND AT BEDTIME   methocarbamol 500 MG tablet Commonly known as: ROBAXIN Take 500 mg by mouth 4 (four) times daily.   Misc. Devices Misc New bipap machine at current  settings.   oxybutynin 10 MG 24 hr tablet Commonly known as: DITROPAN-XL Take 10 mg by mouth daily.   simvastatin 40 MG tablet Commonly known as: ZOCOR TAKE 1 TABLET BY MOUTH DAILY AT 6 PM.   tamsulosin 0.4 MG Caps capsule Commonly known as: FLOMAX Take 0.4 mg by mouth daily.   tirzepatide 2.5 MG/0.5ML Pen Commonly known as: MOUNJARO Inject 2.5 mg into the skin once a week.   tirzepatide 5 MG/0.5ML Pen Commonly known as: MOUNJARO Inject 5 mg into the skin once a week.   triamcinolone cream 0.1 % Commonly known as: KENALOG as needed.   valsartan-hydrochlorothiazide 160-12.5 MG tablet Commonly known as:  DIOVAN-HCT TAKE 1 TABLET BY MOUTH EVERY DAY        History (reviewed): Past Medical History:  Diagnosis Date   Allergy    Arthritis    Community acquired pneumonia of left lower lobe of lung 05/15/2018   Dyspnea    with exertion   Dysrhythmia    frequent PVC's   Frequent urination    GERD (gastroesophageal reflux disease)    History of kidney stones    Hyperlipidemia    Hypertension    Kidney stones    x 4    Sleep apnea    BiPAP   Past Surgical History:  Procedure Laterality Date   CARPAL TUNNEL RELEASE     Right    CERVICAL FUSION     cervical fusion times 2   EYE SURGERY     FOOT TENDON SURGERY  11/14/2011   Dr. Sharol Given   FRACTURE SURGERY     HERNIA REPAIR     left knee surgery     ACL repair    LIPOMA EXCISION Bilateral    on back   MASS EXCISION N/A 01/24/2018   Procedure: EXCISION OF BACK MASS;  Surgeon: Erroll Luna, MD;  Location: [facility];  Service: General;  Laterality: N/A;   SPINE SURGERY     UMBILICAL HERNIA REPAIR     Family History  Problem Relation Age of Onset   COPD Mother    Hypertension Mother    Emphysema Mother    Social History   Socioeconomic History   Marital status: Married    Spouse name: Rose    Number of children: 1   Years of education: college   Highest education level: Some college, no degree  Occupational History   Occupation: Retired     Comment: [employer]   Tobacco Use   Smoking status: Former    Types: Cigarettes    Quit date: 02/09/1980    Years since quitting: 42.7   Smokeless tobacco: Former  Scientific laboratory technician Use: Never used  Substance and Sexual Activity   Alcohol use: Yes    Comment: occasionally   Drug use: No   Sexual activity: Yes  Other Topics Concern   Not on file  Social History Narrative   Lives with your wife. He has one child. He enjoys singing and yard work.   Social Determinants of Health   Financial Resource Strain: Low Risk  (11/13/2022)   Overall Financial Resource Strain  (CARDIA)    Difficulty of Paying Living Expenses: Not hard at all  Food Insecurity: No Food Insecurity (11/13/2022)   Hunger Vital Sign    Worried About Running Out of Food in the Last Year: Never true    Ran Out of Food in the Last Year: Never true  Transportation Needs: No Transportation Needs (11/13/2022)  PRAPARE - Hydrologist (Medical): No    Lack of Transportation (Non-Medical): No  Physical Activity: Insufficiently Active (11/13/2022)   Exercise Vital Sign    Days of Exercise per Week: 3 days    Minutes of Exercise per Session: 30 min  Stress: No Stress Concern Present (11/13/2022)   [place]    Feeling of Stress : Only a little  Social Connections: Socially Integrated (11/14/2022)   Social Connection and Isolation Panel [NHANES]    Frequency of Communication with Friends and Family: More than three times a week    Frequency of Social Gatherings with Friends and Family: Three times a week    Attends Religious Services: More than 4 times per year    Active Member of Clubs or Organizations: Yes    Attends Archivist Meetings: More than 4 times per year    Marital Status: Married    Activities of Daily Living    11/13/2022   12:55 AM  In your present state of health, do you have any difficulty performing the following activities:  Hearing? 0  Vision? 0  Difficulty concentrating or making decisions? 0  Walking or climbing stairs? 0  Dressing or bathing? 0  Bennett errands, shopping? 0  Preparing Food and eating ? N  Using the Toilet? N  In the past six months, have you accidently leaked urine? Y  Do you have problems with loss of bowel control? N  Managing your Medications? N  Managing your Finances? N  Housekeeping or managing your Housekeeping? N    Patient Education/Literacy How often do you need to have someone help you when you read instructions, pamphlets,  or other written materials from your doctor or pharmacy?: 1 - Never What is the last grade level you completed in school?: 12th grade  Exercise Current Exercise Habits: Home exercise routine;Structured exercise class, Type of exercise: Other - see comments;strength training/weights;stretching;walking (yard work), Time (Minutes): 30, Frequency (Times/Week): 3, Weekly Exercise (Minutes/Week): 90, Intensity: Moderate, Exercise limited by: None identified  Diet Patient reports consuming 2 meals a day and 1 snack(s) a day Patient reports that his primary diet is: Regular Patient reports that she does have regular access to food.   Depression Screen    11/14/2022    8:22 AM 05/25/2022    9:39 AM 02/21/2022    9:16 AM 08/25/2021    4:17 PM 09/14/2020    9:43 AM 08/11/2019    9:06 AM 05/22/2019    8:33 AM  PHQ 2/9 Scores  PHQ - 2 Score 0 0 0 0 0 1 0     Fall Risk    11/14/2022    8:22 AM 11/13/2022   12:55 AM 05/25/2022    9:39 AM 02/21/2022    9:15 AM 11/24/2021   10:27 AM  Fall Risk   Falls in the past year? 0 0 0 0 0  Number falls in past yr: 0 0 0 0 0  Injury with Fall? 0 0 0 0 0  Risk for fall due to : No Fall Risks  No Fall Risks No Fall Risks No Fall Risks  Follow up Falls evaluation completed  Falls prevention discussed Falls prevention discussed;Falls evaluation completed Falls evaluation completed;Falls prevention discussed     Objective:   BP (!) 170/68 (BP Location: Left Arm, Patient Position: Sitting, Cuff Size: Large)   Pulse 77   Ht _0  (1.778 m)  Wt (!) 318 lb (144.2 kg)   SpO2 95%   BMI 45.63 kg/m   Last Weight  Most recent update: 11/14/2022  8:06 AM    Weight  144.2 kg (318 lb)               Body mass index is 45.63 kg/m.  Hearing/Vision  Tracy Bennett did not have difficulty with hearing/understanding during the face-to-face interview Tracy Bennett did not have difficulty with his vision during the face-to-face interview Reports that he has had a formal  eye exam by an eye care professional within the past year Reports that he has had a formal hearing evaluation within the past year  Cognitive Function:    11/14/2022    8:31 AM 09/14/2020    9:44 AM 08/11/2019    9:12 AM 02/14/2017    9:26 AM  6CIT Screen  What Year? 0 points 0 points 0 points 0 points  What month? 0 points 0 points 0 points 0 points  What time? 0 points 0 points 0 points 0 points  Count back from 20 0 points 0 points 0 points 0 points  Months in reverse 0 points 0 points 0 points 0 points  Repeat phrase 0 points 0 points 0 points 4 points  Total Score 0 points 0 points 0 points 4 points    Normal Cognitive Function Screening: Yes (Normal:0-7, Significant for Dysfunction: >8)  Immunization & Health Maintenance Record Immunization History  Administered Date(s) Administered   Influenza Split 11/27/2012, 12/16/2012   Influenza, High Dose Seasonal PF 09/17/2018, 09/24/2019, 10/07/2020   Influenza, Seasonal, Injecte, Preservative Fre 12/11/2013   Influenza,inj,Quad PF,6+ Mos 09/01/2014, 08/27/2015, 08/27/2016   Influenza-Unspecified 09/24/2017, 08/27/2021   PFIZER Comirnaty(Gray Top)Covid-19 Tri-Sucrose Vaccine 05/17/2021   PFIZER(Purple Top)SARS-COV-2 Vaccination 03/18/2020, 04/08/2020, 10/12/2020   Pneumococcal Conjugate-13 02/14/2017   Pneumococcal Polysaccharide-23 02/09/2012   Tdap 01/28/2013   Zoster Recombinat (Shingrix) 01/07/2021, 08/26/2021   Zoster, Live 09/15/2011    Health Maintenance  Topic Date Due   Diabetic kidney evaluation - eGFR measurement  11/15/2022 (Originally 09/29/2022)   COVID-19 Vaccine (5 - 2023-24 season) 11/30/2022 (Originally 07/28/2022)   INFLUENZA VACCINE  02/25/2023 (Originally 06/27/2022)   DTaP/Tdap/Td (2 - Td or Tdap) 01/29/2023   HEMOGLOBIN A1C  02/27/2023   OPHTHALMOLOGY EXAM  05/23/2023   Diabetic kidney evaluation - Urine ACR  05/26/2023   FOOT EXAM  05/26/2023   Medicare Annual Wellness (AWV)  11/15/2023   Pneumonia  Vaccine 35+ Years old  Completed   Hepatitis C Screening  Completed   Zoster Vaccines- Shingrix  Completed   HPV VACCINES  Aged Out   COLONOSCOPY (Pts 45-1yr Insurance coverage will need to be confirmed)  Discontinued       Assessment  This is a routine wellness examination for Tracy Bennett  Health Maintenance: Due or Overdue There are no preventive care reminders to display for this patient.   Tracy Doingdoes not need a referral for Community Assistance: Care Management:   no Social Work:    no Prescription Assistance:  no Nutrition/Diabetes Education:  no   Plan:  Personalized Goals  Goals Addressed               This Visit's Progress     Patient Stated (pt-stated)        11/14/2022 AWV Goal: Diabetes Management  Patient will maintain an A1C level below 8.0 Patient will not develop any diabetic foot complications Patient will not experience any hypoglycemic episodes over the next 3 months  Patient will notify our office of any CBG readings outside of the provider recommended range by calling [phone] Patient will adhere to provider recommendations for diabetes management  Patient Self Management Activities take all medications as prescribed and report any negative side effects monitor and record blood sugar readings as directed adhere to a low carbohydrate diet that incorporates lean proteins, vegetables, whole grains, low glycemic fruits check feet daily noting any sores, cracks, injuries, or callous formations see PCP or podiatrist if he notices any changes in his legs, feet, or toenails Patient will visit PCP and have an A1C level checked every 3 to 6 months as directed  have a yearly eye exam to monitor for vascular changes associated with diabetes and will request that the report be sent to his pcp.  consult with his PCP regarding any changes in his health or new or worsening symptoms        Personalized Health Maintenance & Screening  Recommendations  GFR  Lung Cancer Screening Recommended: no (Low Dose CT Chest recommended if Age 20-80 years, 30 pack-year currently smoking OR have quit w/in past 15 years) Hepatitis C Screening recommended: no HIV Screening recommended: no  Advanced Directives: Written information was not given per the patient's request.  Referrals & Orders No orders of the defined types were placed in this encounter.   Follow-up Plan Follow-up with Hali Marry, MD as planned Medicare wellness visit in one year. AVS printed and given to patient.   I have personally reviewed and noted the following in the patient's chart:   Medical and social history Use of alcohol, tobacco or illicit drugs  Current medications and supplements Functional ability and status Nutritional status Physical activity Advanced directives List of other physicians Hospitalizations, surgeries, and ER visits in previous 12 months Vitals Screenings to include cognitive, depression, and falls Referrals and appointments  In addition, I have reviewed and discussed with patient certain preventive protocols, quality metrics, and best practice recommendations. A written personalized care plan for preventive services as well as general preventive health recommendations were provided to patient.     Tinnie Gens, RN BSN  11/14/2022

## 2022-11-15 ENCOUNTER — Other Ambulatory Visit: Payer: Self-pay | Admitting: Family Medicine

## 2022-11-15 ENCOUNTER — Encounter: Payer: Self-pay | Admitting: Internal Medicine

## 2022-11-15 LAB — BASIC METABOLIC PANEL WITH GFR
BUN: 11 mg/dL (ref 7–25)
CO2: 25 mmol/L (ref 20–32)
Calcium: 9.1 mg/dL (ref 8.6–10.3)
Chloride: 101 mmol/L (ref 98–110)
Creat: 1.09 mg/dL (ref 0.70–1.28)
Glucose, Bld: 322 mg/dL — ABNORMAL HIGH (ref 65–99)
Potassium: 4 mmol/L (ref 3.5–5.3)
Sodium: 137 mmol/L (ref 135–146)
eGFR: 70 mL/min/{1.73_m2} (ref >=60)

## 2022-11-15 MED ORDER — GLIPIZIDE 10 MG PO TABS: 10.0000 mg | ORAL_TABLET | Freq: Two times a day (BID) | ORAL | 3 refills | Status: DC

## 2022-11-15 NOTE — Progress Notes (Signed)
Meds ordered this encounter  Medications   glipiZIDE (GLUCOTROL) 10 MG tablet    Sig: Take 1 tablet (10 mg total) by mouth 2 (two) times daily before a meal.    Dispense:  60 tablet    Refill:  3

## 2022-11-15 NOTE — Progress Notes (Signed)
Hi Zinedine, your glucose was way up!  Not sure if you ate something yesterday that may have really bumped your glucose number.  I know that he was working with our pharmacist on getting paperwork to get Ozempic covered.  I suspect he has not been able to get the medication yet some get a send in a new prescription as we have to get his glucose down.  That is probably also contributing to his increased urination.  Your kidney function looks better.  I believe he needed the labs forwarded to a specific provider.  Please forward labs.  In addition his blood pressure was extremely high yesterday.

## 2022-11-15 NOTE — Telephone Encounter (Signed)
Ankle swelling resolved Weight down 311<<320 Will decrease fursomide 20 mg daily BUN/Cr 11/1.09  BS 322

## 2022-11-15 NOTE — Addendum Note (Signed)
Addended by: Beatrice Lecher D on: 11/15/2022 07:38 AM   Modules accepted: Orders

## 2022-11-16 MED ORDER — FUROSEMIDE 40 MG PO TABS: 20.0000 mg | ORAL_TABLET | Freq: Every day | ORAL | 3 refills | Status: DC

## 2022-11-16 NOTE — Addendum Note (Signed)
Addended by: Thora Lance on: 11/16/2022 09:11 AM   Modules accepted: Orders

## 2022-11-29 ENCOUNTER — Ambulatory Visit (INDEPENDENT_AMBULATORY_CARE_PROVIDER_SITE_OTHER): Payer: Medicare Other | Admitting: Family Medicine

## 2022-11-29 ENCOUNTER — Encounter: Payer: Self-pay | Admitting: Family Medicine

## 2022-11-29 VITALS — BP 142/72 | HR 65 | Ht 70.0 in | Wt 324.0 lb

## 2022-11-29 DIAGNOSIS — I5030 Unspecified diastolic (congestive) heart failure: Secondary | ICD-10-CM | POA: Diagnosis not present

## 2022-11-29 DIAGNOSIS — I10 Essential (primary) hypertension: Secondary | ICD-10-CM | POA: Diagnosis not present

## 2022-11-29 DIAGNOSIS — E118 Type 2 diabetes mellitus with unspecified complications: Secondary | ICD-10-CM | POA: Diagnosis not present

## 2022-11-29 LAB — POCT GLYCOSYLATED HEMOGLOBIN (HGB A1C): Hemoglobin A1C: 12.8 % — AB (ref 4.0–5.6)

## 2022-11-29 LAB — GLUCOSE, POCT (MANUAL RESULT ENTRY): POC Glucose: 163 mg/dl — AB (ref 70–99)

## 2022-11-29 NOTE — Assessment & Plan Note (Signed)
Continue to work on [employer], cutting out sugar, and portion controlling.

## 2022-11-29 NOTE — Assessment & Plan Note (Signed)
A1c is uncontrolled today with an A1c of 12.8.  He is really gotten back on track in regards to diet and portion control in the last 2 weeks.  He says before that he was pretty much eating what ever he wanted to eat.  He is currently on metformin and he is also taking the glipizide now which she has been on for about a week and a half.  Encouraged him to report back with his blood sugars in 2 to 3 weeks so that we can continue to make adjustments to get his A1c down.  Sounds like his fasting sugars in the morning are improving gradually.  Continue to work on staying active.  Lab Results  Component Value Date   HGBA1C 12.8 (A) 11/29/2022

## 2022-11-29 NOTE — Assessment & Plan Note (Signed)
If his weight goes up more than 2 to 3 pounds then he may need to take his 40 mg Lasix daily to help remove excess fluid

## 2022-11-29 NOTE — Assessment & Plan Note (Signed)
His blood pressure is elevated today.  It is better than it was when he was last here couple of weeks ago so I do think the diuresis has been helpful.

## 2022-11-29 NOTE — Patient Instructions (Signed)
Please continue with the side.  Continue to track your fasting blood sugars in the morning and if in a couple of weeks you could send those numbers to me over MyChart that would be great that way we can make an adjustment if needed.  Continue to work on those healthy food choices and portion control.  Continue with your furosemide.  If you notice your weight is going up another 2 pounds then please go back up to 40 mg daily for a few days to try to pull that extra fluid off.

## 2022-11-29 NOTE — Progress Notes (Signed)
Established Patient Office Visit  Subjective   Patient ID: Tracy Bennett, male    DOB: 04-11-1946  Age: 77 y.o. MRN: [id number]  Chief Complaint  Patient presents with   Diabetes    HPI Diabetes - no hypoglycemic events. No wounds or sores that are not healing well. No increased thirst or urination. Checking glucose at home. Taking medications as prescribed without any side effects. He has an appt for new consultation with Endocrinology in March.  He is considering an GLP1 but his son is very concerned and has encouraged him to work with a specialist. In the last 2 weeks he has really cut back on portions and has really cut out sugars and says his sugars have been running in the 300s and as of today fasting glucose was 180.  He also started the glipizide that we called in about a week ago.  His insurance would not cover the [facility] or the [facility].  And again he is very hesitant to start a GLP-1.  Hypertension- Pt denies chest pain, SOB, dizziness, or heart palpitations.  Taking meds as directed w/o problems.  Denies medication side effects.    F/U HF - he did see Dr. Jens Som and he felt that he was volume overloaded so increased his Lasix to 40 mg daily instead of 20 mg as needed.  He did take it for a week and then went down to every other day on the 40 mg Lasix.  He denies any excess swelling.   Earlier in December he also was seen for pharyngitis.  Treated with azithromycin and symptoms resolved.    ROS    Objective:     BP (!) 142/72   Pulse 65   Ht '5\' 10"'$  (1.778 m)   Wt (!) 324 lb (147 kg)   SpO2 96%   BMI 46.49 kg/m    Physical Exam Constitutional:      Appearance: He is well-developed.  HENT:     Head: Normocephalic and atraumatic.  Cardiovascular:     Rate and Rhythm: Normal rate and regular rhythm.     Heart sounds: Normal heart sounds.  Pulmonary:     Effort: Pulmonary effort is normal.     Breath sounds: Normal breath sounds.  Skin:    General: Skin is  warm and dry.  Neurological:     Mental Status: He is alert and oriented to person, place, and time.  Psychiatric:        Behavior: Behavior normal.     Results for orders placed or performed in visit on 11/29/22  POCT glycosylated hemoglobin (Hb A1C)  Result Value Ref Range   Hemoglobin A1C 12.8 (A) 4.0 - 5.6 %   HbA1c POC (<> result, manual entry)     HbA1c, POC (prediabetic range)     HbA1c, POC (controlled diabetic range)    POCT glucose (manual entry)  Result Value Ref Range   POC Glucose 163 (A) 70 - 99 mg/dl      The ASCVD Risk score (Arnett DK, et al., 2019) failed to calculate for the following reasons:   The valid total cholesterol range is 130 to 320 mg/dL    Assessment & Plan:   Problem List Items Addressed This Visit       Cardiovascular and Mediastinum   Hypertension    His blood pressure is elevated today.  It is better than it was when he was last here couple of weeks ago so I do think the diuresis  has been helpful.      Relevant Orders   Lipid Panel w/reflex Direct LDL   COMPLETE METABOLIC PANEL WITH GFR   CBC   (HFpEF) heart failure with preserved ejection fraction ([place])    If his weight goes up more than 2 to 3 pounds then he may need to take his 40 mg Lasix daily to help remove excess fluid      Relevant Orders   Lipid Panel w/reflex Direct LDL   COMPLETE METABOLIC PANEL WITH GFR   CBC     Endocrine   Controlled diabetes mellitus type 2 with complications (HCC) - Primary    A1c is uncontrolled today with an A1c of 12.8.  He is really gotten back on track in regards to diet and portion control in the last 2 weeks.  He says before that he was pretty much eating what ever he wanted to eat.  He is currently on metformin and he is also taking the glipizide now which she has been on for about a week and a half.  Encouraged him to report back with his blood sugars in 2 to 3 weeks so that we can continue to make adjustments to get his A1c down.  Sounds  like his fasting sugars in the morning are improving gradually.  Continue to work on staying active.  Lab Results  Component Value Date   HGBA1C 12.8 (A) 11/29/2022        Relevant Orders   Lipid Panel w/reflex Direct LDL   COMPLETE METABOLIC PANEL WITH GFR   CBC   POCT glycosylated hemoglobin (Hb A1C) (Completed)   POCT glucose (manual entry) (Completed)     Other   Severe obesity (BMI >= 40) (HCC)    Continue to work on healthy food choices, cutting out sugar, and portion controlling.       Return in about 3 months (around 02/28/2023).    Beatrice Lecher, MD

## 2022-11-30 DIAGNOSIS — L570 Actinic keratosis: Secondary | ICD-10-CM | POA: Diagnosis not present

## 2022-12-12 ENCOUNTER — Encounter: Payer: Self-pay | Admitting: Family Medicine

## 2022-12-12 DIAGNOSIS — E118 Type 2 diabetes mellitus with unspecified complications: Secondary | ICD-10-CM

## 2022-12-12 MED ORDER — EMPAGLIFLOZIN 10 MG PO TABS: 10.0000 mg | ORAL_TABLET | Freq: Every day | ORAL | 2 refills | Status: DC

## 2022-12-13 MED ORDER — DAPAGLIFLOZIN PROPANEDIOL 5 MG PO TABS: 5.0000 mg | ORAL_TABLET | Freq: Every day | ORAL | 1 refills | Status: DC

## 2022-12-13 NOTE — Addendum Note (Signed)
Addended by: Beatrice Lecher D on: 12/13/2022 01:44 PM   Modules accepted: Orders

## 2022-12-18 ENCOUNTER — Other Ambulatory Visit: Payer: Self-pay | Admitting: Family Medicine

## 2022-12-18 DIAGNOSIS — E118 Type 2 diabetes mellitus with unspecified complications: Secondary | ICD-10-CM

## 2022-12-27 ENCOUNTER — Other Ambulatory Visit: Payer: Self-pay | Admitting: Family Medicine

## 2022-12-27 DIAGNOSIS — E118 Type 2 diabetes mellitus with unspecified complications: Secondary | ICD-10-CM

## 2023-01-04 ENCOUNTER — Other Ambulatory Visit: Payer: Self-pay

## 2023-01-04 DIAGNOSIS — E118 Type 2 diabetes mellitus with unspecified complications: Secondary | ICD-10-CM

## 2023-01-04 MED ORDER — ACCU-CHEK FASTCLIX LANCETS MISC: 11 refills | Status: DC

## 2023-01-05 ENCOUNTER — Other Ambulatory Visit: Payer: Self-pay | Admitting: *Deleted

## 2023-01-05 DIAGNOSIS — E118 Type 2 diabetes mellitus with unspecified complications: Secondary | ICD-10-CM

## 2023-01-05 MED ORDER — ACCU-CHEK FASTCLIX LANCETS MISC: 11 refills | Status: DC

## 2023-01-25 ENCOUNTER — Telehealth: Payer: Self-pay | Admitting: Family Medicine

## 2023-01-25 NOTE — Telephone Encounter (Signed)
Patient called says he would like to come off of some medication states vision has blurred vision says that one of the side effects from glipizide he would like a call back [phone]

## 2023-01-26 NOTE — Telephone Encounter (Signed)
Yes Jardiance was more expensive so we had to change med.  OK to cut glipizide in half and take half twice a day if having sugars less than 70 and see if helps but it doesn't cause vision change.  Yes he needs to make an eye appt.

## 2023-01-26 NOTE — Telephone Encounter (Signed)
LVM asking pt to return call to discuss.

## 2023-01-26 NOTE — Telephone Encounter (Signed)
Pt reports that his BS readings have been looking better. He's eating better than previously not eating any sweets, sodas.  Metformin 500 mg  BID  Glipizide  10 mg  BID  Farxiga 5 mg QD  (x 2 months before breakfast)  He is also taking Lasix 40 mg QD he is no longer splitting. This was recommend by his Cardiologist.    Past 3 weeks waking with itchy/burning eyes. He did reach out to his eye doctor. He has been using some OTC eye drops that have helped some. Was told that he would need to wash eyes out really well. He states that he has been doing that however, but no change.   I advised him to schedule a f/u with his Ophthalmologist to have his eyes checked because it sounds like theres something else going on with his eyes.   Read online that using glipizide and metformin together could cause blurry vision.    Today's AM BS  =140  Gets a little dizzy mid-morning he checks his BS its around 70-80.   Read online that [place] and Vania Rea are basically the same. So due to cost he's taking Wilder Glade  Appointment with Dr. Harle Stanford Endocrinology 3/19 [facility])  Basically after being on the phone with the patient >30.  he wanted to know if he could stop taking the Glipizide for a little while to see if this would change his vision any.   Will fwd to pcp

## 2023-01-26 NOTE — Telephone Encounter (Signed)
Called pt and advised him of recommendations. He voiced understanding and agreed.

## 2023-01-26 NOTE — Telephone Encounter (Signed)
Pls call pt for more detail.  Glipizide does not cause vision changes that I am aware of.  But fluctuations in blood sugars can.  And I am pretty sure he has been on glipizide for a while so I do not know if this is a new concern?

## 2023-01-29 DIAGNOSIS — H6063 Unspecified chronic otitis externa, bilateral: Secondary | ICD-10-CM | POA: Diagnosis not present

## 2023-02-05 ENCOUNTER — Other Ambulatory Visit: Payer: Self-pay | Admitting: Family Medicine

## 2023-02-05 DIAGNOSIS — E118 Type 2 diabetes mellitus with unspecified complications: Secondary | ICD-10-CM

## 2023-02-07 ENCOUNTER — Other Ambulatory Visit: Payer: Self-pay | Admitting: Family Medicine

## 2023-02-07 ENCOUNTER — Telehealth: Payer: Self-pay | Admitting: Family Medicine

## 2023-02-07 DIAGNOSIS — E118 Type 2 diabetes mellitus with unspecified complications: Secondary | ICD-10-CM

## 2023-02-07 DIAGNOSIS — I10 Essential (primary) hypertension: Secondary | ICD-10-CM

## 2023-02-07 NOTE — Telephone Encounter (Signed)
Medication has been sent.  

## 2023-02-07 NOTE — Telephone Encounter (Signed)
Pt called requesting a refill of dapagliflozin propanediol (FARXIGA) 5 MG TABS tablet [id number] . Pt stated they only have 3 pills left. The pharmacy reached out to the office last week.

## 2023-02-12 ENCOUNTER — Other Ambulatory Visit: Payer: Self-pay | Admitting: Family Medicine

## 2023-02-12 DIAGNOSIS — E1165 Type 2 diabetes mellitus with hyperglycemia: Secondary | ICD-10-CM

## 2023-02-13 DIAGNOSIS — Z6841 Body Mass Index (BMI) 40.0 and over, adult: Secondary | ICD-10-CM | POA: Diagnosis not present

## 2023-02-13 DIAGNOSIS — I10 Essential (primary) hypertension: Secondary | ICD-10-CM | POA: Diagnosis not present

## 2023-02-13 DIAGNOSIS — E119 Type 2 diabetes mellitus without complications: Secondary | ICD-10-CM | POA: Diagnosis not present

## 2023-02-13 DIAGNOSIS — E785 Hyperlipidemia, unspecified: Secondary | ICD-10-CM | POA: Diagnosis not present

## 2023-02-13 DIAGNOSIS — Z1330 Encounter for screening examination for mental health and behavioral disorders, unspecified: Secondary | ICD-10-CM | POA: Diagnosis not present

## 2023-02-22 DIAGNOSIS — Z6841 Body Mass Index (BMI) 40.0 and over, adult: Secondary | ICD-10-CM | POA: Diagnosis not present

## 2023-02-22 DIAGNOSIS — G4733 Obstructive sleep apnea (adult) (pediatric): Secondary | ICD-10-CM | POA: Diagnosis not present

## 2023-02-28 ENCOUNTER — Encounter: Payer: Self-pay | Admitting: Family Medicine

## 2023-02-28 ENCOUNTER — Ambulatory Visit (INDEPENDENT_AMBULATORY_CARE_PROVIDER_SITE_OTHER): Payer: Medicare Other | Admitting: Family Medicine

## 2023-02-28 VITALS — BP 138/78 | HR 77 | Temp 97.6°F | Resp 18 | Ht 70.0 in | Wt 327.2 lb

## 2023-02-28 DIAGNOSIS — E118 Type 2 diabetes mellitus with unspecified complications: Secondary | ICD-10-CM

## 2023-02-28 DIAGNOSIS — E1165 Type 2 diabetes mellitus with hyperglycemia: Secondary | ICD-10-CM

## 2023-02-28 DIAGNOSIS — I10 Essential (primary) hypertension: Secondary | ICD-10-CM

## 2023-02-28 DIAGNOSIS — I5030 Unspecified diastolic (congestive) heart failure: Secondary | ICD-10-CM | POA: Diagnosis not present

## 2023-02-28 LAB — POCT GLYCOSYLATED HEMOGLOBIN (HGB A1C): Hemoglobin A1C: 6.5 % — AB (ref 4.0–5.6)

## 2023-02-28 MED ORDER — GLIPIZIDE 10 MG PO TABS: 5.0000 mg | ORAL_TABLET | Freq: Two times a day (BID) | ORAL | 0 refills | Status: DC

## 2023-02-28 MED ORDER — DAPAGLIFLOZIN PROPANEDIOL 10 MG PO TABS: 10.0000 mg | ORAL_TABLET | Freq: Every day | ORAL | 1 refills | Status: DC

## 2023-02-28 NOTE — Assessment & Plan Note (Signed)
He reports that he is taking 40 mg every other day and that seems to be working well.Though on our scale he is up about 2 lbs.  Monitor carefully.

## 2023-02-28 NOTE — Assessment & Plan Note (Signed)
Pete blood pressure improved but still a little borderline elevated today we will keep an eye on it.

## 2023-02-28 NOTE — Progress Notes (Signed)
Established Patient Office Visit  Subjective   Patient ID: Tracy Bennett, male    DOB: Feb 15, 1946  Age: 77 y.o. MRN: [id number]  Chief Complaint  Patient presents with   Follow-up    Patient is here for his 3 month follow up for DM, patient states his concern with the medication he is taking glipizide 10mg  he states that his endocrinologist told him to cut pill in half and only take 5mg  due to him feeling light headed and having some dizziness afterwards    HPI  Here for diabetic follow-up-he had really gotten back on track since I last saw him in regards to diet and exercise and has lost about 16 pounds.  He actually started having a couple of hypoglycemic events while working in the yard so did cut back his glipizide to 5 mg twice a day.  He is also taking metformin and [place].  Did consult with [facility] endocrinology about 2 weeks ago in regards to potentially starting a GLP-1 as he would really like to lose more weight if possible.  His BMI is greater than 40.  Been a fair amount of time talking about continuing to work on dietary changes keeping meals less than 45 g of carbs and really cutting back on those sugary high carb and starch foods.      ROS    Objective:     BP 138/78 (BP Location: Right Arm, Cuff Size: Large)   Pulse 77   Temp 97.6 F (36.4 C) (Oral)   Resp 18   Ht 5\' 10"  (1.778 m)   Wt (!) 327 lb 3.2 oz (148.4 kg)   SpO2 96%   BMI 46.95 kg/m    Physical Exam Constitutional:      Appearance: He is well-developed.  HENT:     Head: Normocephalic and atraumatic.  Cardiovascular:     Rate and Rhythm: Normal rate and regular rhythm.     Heart sounds: Normal heart sounds.  Pulmonary:     Effort: Pulmonary effort is normal.     Breath sounds: Normal breath sounds.  Skin:    General: Skin is warm and dry.  Neurological:     Mental Status: He is alert and oriented to person, place, and time.  Psychiatric:        Behavior: Behavior normal.      Results for orders placed or performed in visit on 02/28/23  POCT HgB A1C  Result Value Ref Range   Hemoglobin A1C 6.5 (A) 4.0 - 5.6 %   HbA1c POC (<> result, manual entry)     HbA1c, POC (prediabetic range)     HbA1c, POC (controlled diabetic range)        The ASCVD Risk score (Arnett DK, et al., 2019) failed to calculate for the following reasons:   The valid total cholesterol range is 130 to 320 mg/dL    Assessment & Plan:   Problem List Items Addressed This Visit       Cardiovascular and Mediastinum   Hypertension - Primary    Pete blood pressure improved but still a little borderline elevated today we will keep an eye on it.      (HFpEF) heart failure with preserved ejection fraction    He reports that he is taking 40 mg every other day and that seems to be working well.Though on our scale he is up about 2 lbs.  Monitor carefully.          Endocrine  Controlled diabetes mellitus type 2 with complications    Is most interested in increasing the [place] and coming off of the glipizide particularly because of the hypoglycemia he has been experiencing since starting to work in the yard.  Will go ahead and increase dose and discontinued glipizide.  He is still open to a GLP-1 and requested that our clinical pharmacist reach back out to him in this regard he says if it is reasonable with his insurance he would really like to try it.      Relevant Medications   dapagliflozin propanediol (FARXIGA) 10 MG TABS tablet   Other Visit Diagnoses     Type 2 diabetes mellitus with hyperglycemia, without long-term current use of insulin       Relevant Medications   dapagliflozin propanediol (FARXIGA) 10 MG TABS tablet   Other Relevant Orders   POCT HgB A1C (Completed)       Return in about 3 months (around 05/30/2023) for Diabetes follow-up.    Beatrice Lecher, MD

## 2023-02-28 NOTE — Assessment & Plan Note (Signed)
Is most interested in increasing the [place] and coming off of the glipizide particularly because of the hypoglycemia he has been experiencing since starting to work in the yard.  Will go ahead and increase dose and discontinued glipizide.  He is still open to a GLP-1 and requested that our clinical pharmacist reach back out to him in this regard he says if it is reasonable with his insurance he would really like to try it.

## 2023-03-01 ENCOUNTER — Telehealth: Payer: Self-pay | Admitting: Family Medicine

## 2023-03-01 ENCOUNTER — Other Ambulatory Visit: Payer: Medicare Other | Admitting: Pharmacist

## 2023-03-01 DIAGNOSIS — I5030 Unspecified diastolic (congestive) heart failure: Secondary | ICD-10-CM | POA: Diagnosis not present

## 2023-03-01 DIAGNOSIS — I10 Essential (primary) hypertension: Secondary | ICD-10-CM | POA: Diagnosis not present

## 2023-03-01 DIAGNOSIS — E118 Type 2 diabetes mellitus with unspecified complications: Secondary | ICD-10-CM | POA: Diagnosis not present

## 2023-03-01 MED ORDER — TIRZEPATIDE 2.5 MG/0.5ML ~~LOC~~ SOAJ: 2.5000 mg | SUBCUTANEOUS | 0 refills | Status: DC

## 2023-03-01 NOTE — Progress Notes (Signed)
Meds ordered this encounter  Medications   tirzepatide (MOUNJARO) 2.5 MG/0.5ML Pen    Sig: Inject 2.5 mg into the skin once a week.    Dispense:  2 mL    Refill:  0   Fantatic!

## 2023-03-01 NOTE — Patient Instructions (Signed)
Bryon Lions speaking with you today! As discussed, we will send mounjaro 2.5mg  once weekly to the pharmacy. I will touch base with you in ~2 weeks to see if it was affordable for you to obtain and start - versus if it was not -and we can discuss other options.  Take care, Luana Shu, PharmD, [facility]

## 2023-03-01 NOTE — Telephone Encounter (Signed)
Tracy Bennett called and states he has a medicine question about his diabetes medication.  He is wondering If Dr. Madilyn Fireman wants him to keep taking the Metformin and the [place] and see how the Wilder Glade does, or would she prefer him to take the Metformin and Mounjaro? Please call patient and let him know what you would like for him to do.  Thank you

## 2023-03-01 NOTE — Addendum Note (Signed)
Addended by: Beatrice Lecher D on: 03/01/2023 04:45 PM   Modules accepted: Orders

## 2023-03-01 NOTE — Progress Notes (Signed)
03/01/2023 Name: Tracy Bennett MRN: [id number] DOB: 09/25/1946  Chief Complaint  Patient presents with   Diabetes   Medication Assistance    Lazavion Fana is a 77 y.o. year old male who presented for a telephone visit.   They were referred to the pharmacist by their PCP for assistance in managing diabetes and medication access.    Subjective:  Care Team: Primary Care Provider: Hali Marry, MD ; Next Scheduled Visit: 06/05/23   Medication Access/Adherence  Current Pharmacy:  [id number] [place], [place] [address] [place] [address] Phone: [phone] Fax: [phone]   Patient reports affordability concerns with their medications: Yes , GLP1s are cost prohibitive, then $42 for farxiga first fill, and $10 for 90DS  Patient reports access/transportation concerns to their pharmacy: No  Patient reports adherence concerns with their medications:  No    Diabetes:  Current medications: farxiga 10mg  daily (recent increased), metformin 500mg  BID. A1c 12.8 down to 6.5 with food and exercise changes Medications tried in the past: glipizide (recently discontinued)  Current glucose readings: 120s fasting, had some hypoglycemic episodes during gardening & outside work,  Using traditional meter; testing 1-3 times daily   Patient denies hypoglycemic s/sx including dizziness, shakiness, sweating. Patient denies hyperglycemic symptoms including polyuria, polydipsia, polyphagia, nocturia, neuropathy, blurred vision.   Objective:  Lab Results  Component Value Date   HGBA1C 6.5 (A) 02/28/2023    Lab Results  Component Value Date   CREATININE 1.09 11/14/2022   BUN 11 11/14/2022   NA 137 11/14/2022   K 4.0 11/14/2022   CL 101 11/14/2022   CO2 25 11/14/2022    Lab Results  Component Value Date   CHOL 119 09/29/2021   HDL 30 (L) 09/29/2021   LDLCALC 69 09/29/2021   LDLDIRECT 80 11/22/2017   TRIG 113 09/29/2021    CHOLHDL 4.0 09/29/2021    Medications Reviewed Today     Reviewed by Hali Marry, MD (Physician) on 02/28/23 at 1104  Med List Status: <None>   Medication Order Taking? Sig Documenting Provider Last Dose Status Informant  Accu-Chek FastClix Lancets MISC [id number] Yes Dx DM E11.9 Check fasting blood sugar daily and after largest meal of the day a couple time a week. Hali Marry, MD Taking Active   ACCU-CHEK GUIDE test strip [id number] Yes CHECK FASTING BLOOD SUGAR EVERY MORNING. DX: E11.9 Hali Marry, MD Taking Active   dapagliflozin propanediol (FARXIGA) 10 MG TABS tablet [id number]  Take 1 tablet (10 mg total) by mouth daily before breakfast. Hali Marry, MD  Active   finasteride (PROSCAR) 5 MG tablet [id number] Yes Take 1 tablet (5 mg total) by mouth daily. Hali Marry, MD Taking Active Self  furosemide (LASIX) 40 MG tablet [id number] Yes Take 0.5 tablets (20 mg total) by mouth daily. Deboraha Sprang, MD Taking Active   loratadine (CLARITIN) 10 MG tablet [id number] Yes Take 10 mg by mouth daily as needed for allergies.  [provider] Taking Active Self  metFORMIN (GLUCOPHAGE-XR) 500 MG 24 hr tablet [id number] Yes TAKE 1 TABLET (500 MG TOTAL) BY MOUTH IN THE MORNING AND AT BEDTIME Hali Marry, MD Taking Active   Misc. Devices [place] [id number] Yes New bipap machine at current settings. [provider] Taking Active   oxybutynin (DITROPAN-XL) 10 MG 24 hr tablet [id number] Yes Take 10 mg by mouth daily. Gwendel Hanson, MD Taking Active   pantoprazole (PROTONIX) 40 MG tablet [id number]  Yes TAKE 1 TABLET BY MOUTH EVERY DAY Hali Marry, MD Taking Active   simvastatin (ZOCOR) 40 MG tablet [id number] Yes TAKE 1 TABLET BY MOUTH DAILY AT 6 PM. Hali Marry, MD Taking Active   tamsulosin [facility]) 0.4 MG CAPS capsule [id number] Yes Take 0.4 mg by mouth daily.  [provider] Taking Active Self   valsartan-hydrochlorothiazide (DIOVAN-HCT) 160-12.5 MG tablet [id number] Yes TAKE 1 TABLET BY MOUTH EVERY DAY Hali Marry, MD Taking Active   Med List Note Merceda Elks, CPhT 01/11/18 1554): Pt has BiPap              Assessment/Plan:   Diabetes: - Currently controlled, however patient now desires  - Reviewed long term cardiovascular and renal outcomes of uncontrolled blood sugar - Reviewed goal A1c, goal fasting, and goal 2 hour post prandial glucose - Recommend to send mounjaro 2.5mg  weekly to pharmacy to investigate cost, patient prefers this medication despite possibility of cost burden. - Reviewed patient eligibility for med access programs: medicare extra help/LIS is not eligible, and patient is uncertain on income threshold for 400% FPL of ozempic via novonordisk, but this can be a second option to pursue if Darcel Bayley is cost prohibitive. - Recommend to check glucose 1-3 times daily as his current practice   Follow Up Plan: 2-4 weeks   Larinda Buttery, PharmD, [facility]

## 2023-03-01 NOTE — Telephone Encounter (Signed)
If he is able to get the [facility] and started it then he can drop back down to half a tab of the [place].  Because of his history of heart failure will likely end up tapering down on the metformin after that if he is tolerating it well and were able to increase his dose on the [facility].

## 2023-03-02 DIAGNOSIS — R399 Unspecified symptoms and signs involving the genitourinary system: Secondary | ICD-10-CM | POA: Diagnosis not present

## 2023-03-02 DIAGNOSIS — N401 Enlarged prostate with lower urinary tract symptoms: Secondary | ICD-10-CM | POA: Diagnosis not present

## 2023-03-02 DIAGNOSIS — N3943 Post-void dribbling: Secondary | ICD-10-CM | POA: Diagnosis not present

## 2023-03-02 LAB — CBC
HCT: 46.4 % (ref 38.5–50.0)
Hemoglobin: 15.1 g/dL (ref 13.2–17.1)
MCH: 28.5 pg (ref 27.0–33.0)
MCHC: 32.5 g/dL (ref 32.0–36.0)
MCV: 87.5 fL (ref 80.0–100.0)
MPV: 12.4 fL (ref 7.5–12.5)
Platelets: 167 10*3/uL (ref 140–400)
RBC: 5.3 10*6/uL (ref 4.20–5.80)
RDW: 12.2 % (ref 11.0–15.0)
WBC: 5.8 10*3/uL (ref 3.8–10.8)

## 2023-03-02 LAB — COMPLETE METABOLIC PANEL WITH GFR
AG Ratio: 1.8 (calc) (ref 1.0–2.5)
ALT: 35 U/L (ref 9–46)
AST: 27 U/L (ref 10–35)
Albumin: 4.1 g/dL (ref 3.6–5.1)
Alkaline phosphatase (APISO): 47 U/L (ref 35–144)
BUN: 15 mg/dL (ref 7–25)
CO2: 26 mmol/L (ref 20–32)
Calcium: 9.3 mg/dL (ref 8.6–10.3)
Chloride: 104 mmol/L (ref 98–110)
Creat: 1.28 mg/dL (ref 0.70–1.28)
Globulin: 2.3 g/dL (calc) (ref 1.9–3.7)
Glucose, Bld: 157 mg/dL — ABNORMAL HIGH (ref 65–99)
Potassium: 4.1 mmol/L (ref 3.5–5.3)
Sodium: 141 mmol/L (ref 135–146)
Total Bilirubin: 0.7 mg/dL (ref 0.2–1.2)
Total Protein: 6.4 g/dL (ref 6.1–8.1)
eGFR: 58 mL/min/{1.73_m2} — ABNORMAL LOW (ref >=60)

## 2023-03-02 LAB — LIPID PANEL W/REFLEX DIRECT LDL
Cholesterol: 122 mg/dL (ref <200)
HDL: 36 mg/dL — ABNORMAL LOW (ref >=40)
LDL Cholesterol (Calc): 67 mg/dL (calc)
Non-HDL Cholesterol (Calc): 86 mg/dL (calc) (ref <130)
Total CHOL/HDL Ratio: 3.4 (calc) (ref <5.0)
Triglycerides: 100 mg/dL (ref <150)

## 2023-03-02 NOTE — Telephone Encounter (Signed)
LMVM and sent message to MyChart with Dr. Shelah Lewandowsky recommendations.

## 2023-03-02 NOTE — Progress Notes (Signed)
HI Tracy Bennett. Your LDL is at goal. Liver is back to normal. Your kidney function went up slightly so I want to recheck a BMP in 6 weeks.

## 2023-03-02 NOTE — Telephone Encounter (Signed)
Per patient, he was able to get the [facility] rx. He may start the [facility] rx this weekend. Patient was notified of provider's note. The patient said that if he starts the [facility] rx this weekend, he will take 1/2 tab of the [place] and will continue taking the metformin rx. Unless he is advise from the provider to stop the Metformin rx. During the call the patient mentioned that the endocrinologist took him off of glipizide last week. He also had a visit with his Urologist and had completed labs for a PSA check. Patient is available this weekend if provider needs to contact him regarding his medications.

## 2023-03-06 NOTE — Telephone Encounter (Signed)
S, that is correct he will decrease the Farxiga to half a tab and continue with the metformin.

## 2023-03-07 NOTE — Telephone Encounter (Signed)
Patient has been updated of provider's note. Per patient, he started taking the Mounjaro rx on Sunday. So far he is tolerating the rx well. Patient wants the provider to be aware that his latest PSA results were 0.3.

## 2023-03-15 ENCOUNTER — Other Ambulatory Visit: Payer: Medicare Other | Admitting: Pharmacist

## 2023-03-15 DIAGNOSIS — E118 Type 2 diabetes mellitus with unspecified complications: Secondary | ICD-10-CM

## 2023-03-15 DIAGNOSIS — I10 Essential (primary) hypertension: Secondary | ICD-10-CM

## 2023-03-15 DIAGNOSIS — I5030 Unspecified diastolic (congestive) heart failure: Secondary | ICD-10-CM

## 2023-03-15 NOTE — Progress Notes (Signed)
03/15/2023 Name: Oniel Meleski MRN: [id number] DOB: 1946-04-17  Chief Complaint  Patient presents with   Diabetes   Medication Assistance    Arlow Spiers is a 77 y.o. year old male who presented for a telephone visit.   They were referred to the pharmacist by their PCP for assistance in managing diabetes and medication access.    Subjective:  Care Team: Primary Care Provider: Agapito Games, MD ; Next Scheduled Visit: 06/05/23   Medication Access/Adherence  [phone] [place] [place] - [address] [place] [address] Phone: [phone] Fax: [phone]   Patient reports affordability concerns with their medications: Yes , GLP1s are cost prohibitive, then $42 for farxiga first fill, and $10 for 90DS.  Today's call, patient explained the following cost information he investigated with his well[employer] insurance for [employer]:  [phone] cost in stage 1 (deductible still needs met) $770 covered by his wellcare, patient pay $128  Stage 2 satisfied deductible, $11 for mounjaro now (this is his current cost) $224 coverage gap (stage 3) ----------- $11 farxiga   Patient reports access/transportation concerns to their pharmacy: No  Patient reports adherence concerns with their medications:  No    Diabetes:  Current medications: farxiga  daily (PCP advised to reduce with start of mounjaro), mounjaro 2.5mg  weekly on Sundays, metformin  BID  farxiga  daily (recent increased), metformin  BID. A1c 12.8 down to 6.5 with food and exercise changes Medications tried in the past: glipizide (recently discontinued d/t hypoglycemic episodes while gardening midday)  Current glucose readings: 121-132 before breakfast. Lowest was 111.  Using traditional meter; testing 1-3 times daily   Patient denies hypoglycemic s/sx including dizziness, shakiness, sweating. Patient denies hyperglycemic symptoms including  polyuria, polydipsia, polyphagia, nocturia, neuropathy, blurred vision.   Objective:  Lab Results  Component Value Date   HGBA1C 6.5 (A) 02/28/2023    Lab Results  Component Value Date   CREATININE 1.28 03/01/2023   BUN 15 03/01/2023   NA 141 03/01/2023   K 4.1 03/01/2023   CL 104 03/01/2023   CO2 26 03/01/2023    Lab Results  Component Value Date   CHOL 122 03/01/2023   HDL 36 (L) 03/01/2023   LDLCALC 67 03/01/2023   LDLDIRECT 80 11/22/2017   TRIG 100 03/01/2023   CHOLHDL 3.4 03/01/2023    Medications Reviewed Today     Reviewed by Gabriel Carina, RPH (Pharmacist) on 03/15/23 at 1633  Med List Status: <None>   Medication Order Taking? Sig Documenting Provider Last Dose Status Informant  Accu-Chek FastClix Lancets MISC [id number]  Dx DM E11.9 Check fasting blood sugar daily and after largest meal of the day a couple time a week. Agapito Games, MD  Active   ACCU-CHEK GUIDE test strip [id number]  CHECK FASTING BLOOD SUGAR EVERY MORNING. DX: E11.9 Agapito Games, MD  Active   dapagliflozin propanediol (FARXIGA) 10 MG TABS tablet [id number]  Take 1 tablet (10 mg total) by mouth daily before breakfast. Agapito Games, MD  Active   finasteride (PROSCAR) 5 MG tablet [id number]  Take 1 tablet (5 mg total) by mouth daily. Agapito Games, MD  Active Self  furosemide (LASIX) 40 MG tablet [id number]  Take 0.5 tablets (20 mg total) by mouth daily. Duke Salvia, MD  Active   loratadine (CLARITIN) 10 MG tablet [id number]  Take 10 mg by mouth daily as needed for allergies.  [provider]  Active Self  metFORMIN (GLUCOPHAGE-XR) 500 MG 24 hr tablet [id number]  TAKE 1 TABLET (500 MG TOTAL) BY MOUTH IN THE MORNING AND AT BEDTIME Agapito Games, MD  Active   Misc. Devices MISC [id number]  New bipap machine at current settings. [provider]  Active   oxybutynin (DITROPAN-XL) 10 MG 24 hr tablet [id number]  Take 10 mg by mouth daily. Melene Plan, MD  Active   pantoprazole (PROTONIX) 40 MG tablet [id number]  TAKE 1 TABLET BY MOUTH EVERY DAY Agapito Games, MD  Active   simvastatin (ZOCOR) 40 MG tablet [id number]  TAKE 1 TABLET BY MOUTH DAILY AT 6 PM. Agapito Games, MD  Active   tamsulosin [facility]) 0.4 MG CAPS capsule [id number]  Take 0.4 mg by mouth daily.  [provider]  Active Self  tirzepatide [name]) 2.5 MG/0.5ML Pen [id number] Yes Inject 2.5 mg into the skin once a week. Agapito Games, MD Taking Active   valsartan-hydrochlorothiazide (DIOVAN-HCT) 160-12.5 MG tablet [id number]  TAKE 1 TABLET BY MOUTH EVERY DAY Agapito Games, MD  Active   Med List Note Raynelle Chary, CPhT 01/11/18 1554): Pt has BiPap              Assessment/Plan:   Diabetes: - Currently controlled - Reviewed long term cardiovascular and renal outcomes of uncontrolled blood sugar - Reviewed goal A1c, goal fasting, and goal 2 hour post prandial glucose - Recommend to increase mounjaro to  weekly. - Recommend to continue farxiga at  daily (optimized dose for CV and renal benefit), and upon increasing mounjaro, can discontinue metformin. Will collaborate with PCP for finalizing this plan. - Reviewed patient eligibility for med access programs: medicare extra help/LIS is not eligible, and patient is uncertain on income threshold for 400% FPL of ozempic via novonordisk, but this can be a second option to pursue if [name] is cost prohibitive. - Recommend to check glucose 1-3 times daily as his current practice   Follow Up Plan: 1-2 weeks   Lynnda Shields, PharmD, BCPS Clinical Pharmacist [facility]

## 2023-03-16 ENCOUNTER — Other Ambulatory Visit: Payer: Medicare Other | Admitting: Pharmacist

## 2023-03-16 MED ORDER — TIRZEPATIDE 5 MG/0.5ML ~~LOC~~ SOAJ: 5.0000 mg | SUBCUTANEOUS | 0 refills | Status: DC

## 2023-03-16 NOTE — Addendum Note (Signed)
Addended by: Nani Gasser D on: 03/16/2023 07:34 AM   Modules accepted: Orders

## 2023-03-16 NOTE — Progress Notes (Signed)
Meds ordered this encounter  Medications   tirzepatide (MOUNJARO) 5 MG/0.5ML Pen    Sig: Inject 5 mg into the skin once a week.    Dispense:  2 mL    Refill:  0    

## 2023-03-16 NOTE — Patient Instructions (Addendum)
Tracy Bennett,  Thank you for reviewing your medications with me today. As discussed, here is a review of the plan:    ---> Increase mounjaro to  weekly (after completing 4 weeks worth of the 2.5mg  weekly injections)    ---->Can finish out the farxiga  supply, then increase to  daily with his new prescription of 90day supply. (To optimize heart and kidney benefit)   -----> When increasing mounjaro to  weekly, can try stopping the metformin to streamline medications.  If your mounjaro refill is costly or you run into issues, give me a call at [phone].   Take care,  Elmarie Shiley, PharmD, BCPS Clinical Pharmacist [facility]

## 2023-03-16 NOTE — Progress Notes (Signed)
03/16/2023 Name: Tracy Bennett MRN: [id number] DOB: 08/25/1946  No chief complaint on file.   Tracy Bennett is a 77 y.o. year old male who presented for a telephone visit.   They were referred to the pharmacist by their PCP for assistance in managing diabetes and medication access.    Subjective:  Care Team: Primary Care Provider: Agapito Games, MD ; Next Scheduled Visit: 06/05/23   Medication Access/Adherence  Current Pharmacy:  [facility] [phone] - [place], [place] - [address]  [place] [address] Phone: [phone] Fax: [phone]   Patient previously reported affordability concerns with their medications:  GLP1s are cost prohibitive, then $42 for farxiga first fill, and $10 for 90DS.  Today's call, patient explained the following cost information he investigated with his wellcare insurance for [employer]:  [phone] cost in stage 1 (deductible still needs met) $770 covered by his wellcare, patient pay $128  Stage 2 satisfied deductible, $11 for mounjaro now (this is his current cost) $224 coverage gap (stage 3) ----------- $11 farxiga   Patient reports access/transportation concerns to their pharmacy: No  Patient reports adherence concerns with their medications:  No    Diabetes:  Current medications: farxiga  daily (PCP advised to reduce with start of mounjaro), mounjaro 2.5mg  weekly on Sundays, metformin  BID  farxiga  daily (recent increased), metformin  BID. A1c 12.8 down to 6.5 with food and exercise changes Medications tried in the past: glipizide (recently discontinued d/t hypoglycemic episodes while gardening midday)  Current glucose readings: 121-132 before breakfast. Today was 114 per patient report  Using traditional meter; testing 1-3 times daily   Patient denies hypoglycemic s/sx including dizziness, shakiness, sweating. Patient denies hyperglycemic symptoms including polyuria, polydipsia,  polyphagia, nocturia, neuropathy, blurred vision.   Objective:  Lab Results  Component Value Date   HGBA1C 6.5 (A) 02/28/2023    Lab Results  Component Value Date   CREATININE 1.28 03/01/2023   BUN 15 03/01/2023   NA 141 03/01/2023   K 4.1 03/01/2023   CL 104 03/01/2023   CO2 26 03/01/2023    Lab Results  Component Value Date   CHOL 122 03/01/2023   HDL 36 (L) 03/01/2023   LDLCALC 67 03/01/2023   LDLDIRECT 80 11/22/2017   TRIG 100 03/01/2023   CHOLHDL 3.4 03/01/2023    Medications Reviewed Today     Reviewed by Gabriel Carina, RPH (Pharmacist) on 03/15/23 at 1633  Med List Status: <None>   Medication Order Taking? Sig Documenting Provider Last Dose Status Informant  Accu-Chek FastClix Lancets MISC [id number]  Dx DM E11.9 Check fasting blood sugar daily and after largest meal of the day a couple time a week. Agapito Games, MD  Active   ACCU-CHEK GUIDE test strip [id number]  CHECK FASTING BLOOD SUGAR EVERY MORNING. DX: E11.9 Agapito Games, MD  Active   dapagliflozin propanediol (FARXIGA) 10 MG TABS tablet [id number]  Take 1 tablet (10 mg total) by mouth daily before breakfast. Agapito Games, MD  Active   finasteride (PROSCAR) 5 MG tablet [id number]  Take 1 tablet (5 mg total) by mouth daily. Agapito Games, MD  Active Self  furosemide (LASIX) 40 MG tablet [id number]  Take 0.5 tablets (20 mg total) by mouth daily. Duke Salvia, MD  Active   loratadine (CLARITIN) 10 MG tablet [id number]  Take 10 mg by mouth daily as needed for allergies.  [provider]  Active Self  metFORMIN (GLUCOPHAGE-XR) 500 MG 24  hr tablet [id number]  TAKE 1 TABLET (500 MG TOTAL) BY MOUTH IN THE MORNING AND AT BEDTIME Agapito Games, MD  Active   Misc. Devices MISC [id number]  New bipap machine at current settings. [provider]  Active   oxybutynin (DITROPAN-XL) 10 MG 24 hr tablet [id number]  Take 10 mg by mouth daily. Melene Plan, MD   Active   pantoprazole (PROTONIX) 40 MG tablet [id number]  TAKE 1 TABLET BY MOUTH EVERY DAY Agapito Games, MD  Active   simvastatin (ZOCOR) 40 MG tablet [id number]  TAKE 1 TABLET BY MOUTH DAILY AT 6 PM. Agapito Games, MD  Active   tamsulosin [facility]) 0.4 MG CAPS capsule [id number]  Take 0.4 mg by mouth daily.  [provider]  Active Self  tirzepatide [name]) 2.5 MG/0.5ML Pen [id number] Yes Inject 2.5 mg into the skin once a week. Agapito Games, MD Taking Active   valsartan-hydrochlorothiazide (DIOVAN-HCT) 160-12.5 MG tablet [id number]  TAKE 1 TABLET BY MOUTH EVERY DAY Agapito Games, MD  Active   Med List Note Raynelle Chary, CPhT 01/11/18 1554): Pt has BiPap              Assessment/Plan:   Diabetes: - Currently controlled - Reviewed long term cardiovascular and renal outcomes of uncontrolled blood sugar - Reviewed goal A1c, goal fasting, and goal 2 hour post prandial glucose - Collaborated with PCP, and discussed the following plan with patient:    ---> Increase mounjaro to  weekly (after completing 4 weeks worth of the 2.5mg  weekly injections)    ---->Can finish out the farxiga  supply, then increase to  daily with his new prescription of 90day supply. (To optimize CV and renal benefit)   -----> When increasing mounjaro to  weekly, can try stopping the metformin to streamline medications. - Reviewed patient eligibility for med access programs: medicare extra help/LIS is not eligible, and patient is uncertain on income threshold for 400% FPL of ozempic via novonordisk, but this can be a second option to pursue if [name] is cost prohibitive. - Recommend to check glucose 1-3 times daily as his current practice   Follow Up Plan: 2-4 weeks  Lynnda Shields, PharmD, BCPS Clinical Pharmacist [facility]

## 2023-03-23 ENCOUNTER — Ambulatory Visit (INDEPENDENT_AMBULATORY_CARE_PROVIDER_SITE_OTHER): Payer: Medicare Other | Admitting: Family Medicine

## 2023-03-23 ENCOUNTER — Encounter: Payer: Self-pay | Admitting: Family Medicine

## 2023-03-23 VITALS — BP 136/60 | HR 72 | Ht 70.0 in | Wt 321.0 lb

## 2023-03-23 DIAGNOSIS — J019 Acute sinusitis, unspecified: Secondary | ICD-10-CM | POA: Diagnosis not present

## 2023-03-23 DIAGNOSIS — E118 Type 2 diabetes mellitus with unspecified complications: Secondary | ICD-10-CM | POA: Diagnosis not present

## 2023-03-23 MED ORDER — AZITHROMYCIN 250 MG PO TABS: ORAL_TABLET | ORAL | 0 refills | Status: AC

## 2023-03-23 NOTE — Progress Notes (Signed)
Acute Office Visit  Subjective:     Patient ID: Tracy Bennett, male    DOB: 10-02-1946, 77 y.o.   MRN: [id number]  Chief Complaint  Patient presents with   Sinusitis    Started 5 days ago. He has pressure behind his eyes, runny nose, and hackey cough (yellow mucus). He has only been taking Tylenol and cough drops. Denies any f/s/c    HPI Patient is in today for sinus sxs. 6 days of nasal congestion, cough, and drainage.  Using cough drops and steam showers.  . Cough is mostly dry but some yellow sputum. Nasal congestion was more severe but is some better. ST initially but now better.  No ear pain . Had ears cleaned out 2 weeks ago with ENT.    ROS      Objective:    BP 136/60   Pulse 72   Ht 5\' 10"  (1.778 m)   Wt (!) 321 lb (145.6 kg)   SpO2 96%   BMI 46.06 kg/m    Physical Exam Constitutional:      Appearance: He is well-developed.  HENT:     Head: Normocephalic and atraumatic.     Right Ear: Tympanic membrane, ear canal and external ear normal.     Left Ear: Tympanic membrane, ear canal and external ear normal.     Nose: Nose normal.     Mouth/Throat:     Pharynx: Oropharynx is clear.  Eyes:     Conjunctiva/sclera: Conjunctivae normal.     Pupils: Pupils are equal, round, and reactive to light.  Neck:     Thyroid: No thyromegaly.  Cardiovascular:     Rate and Rhythm: Normal rate.     Heart sounds: Normal heart sounds.  Pulmonary:     Effort: Pulmonary effort is normal.     Breath sounds: Normal breath sounds.  Musculoskeletal:     Cervical back: Neck supple.  Lymphadenopathy:     Cervical: No cervical adenopathy.  Skin:    General: Skin is warm and dry.  Neurological:     Mental Status: He is alert and oriented to person, place, and time.     No results found for any visits on 03/23/23.      Assessment & Plan:   Problem List Items Addressed This Visit       Endocrine   Controlled diabetes mellitus type 2 with complications (HCC)    Still  having some difficulty getting his medication.  He was able to get his GLP-1 for $11 last month but then this next month I told him it was can be over 100.  He should be in the tier 2 of his plan and so should be $11 each month until he hits his Medicare gap.  He did reach out to his health insurance and they said that he has to still pay 20 to 30% of the cost.  I did encourage him to reach back out to our clinical pharmacist in this regard to see if she can be of any assistance.      Other Visit Diagnoses     Acute non-recurrent sinusitis, unspecified location    -  Primary   Relevant Medications   azithromycin (ZITHROMAX) 250 MG tablet      Sinusitis - discussed monitoring for a couple more days and doing symptomatic care.  Will tx with zpack.  Call if not better in one week.      Meds ordered this encounter  Medications  azithromycin (ZITHROMAX) 250 MG tablet    Sig: 2 Ttabs PO on Day 1, then one a day x 4 days.    Dispense:  6 tablet    Refill:  0    No follow-ups on file.  I spent 20 minutes on the day of the encounter to include pre-visit record review, face-to-face time with the patient and post visit ordering of test.  Nani Gasser, MD

## 2023-03-23 NOTE — Assessment & Plan Note (Signed)
Still having some difficulty getting his medication.  He was able to get his GLP-1 for $11 last month but then this next month I told him it was can be over 100.  He should be in the tier 2 of his plan and so should be $11 each month until he hits his Medicare gap.  He did reach out to his health insurance and they said that he has to still pay 20 to 30% of the cost.  I did encourage him to reach back out to our clinical pharmacist in this regard to see if she can be of any assistance.

## 2023-03-26 ENCOUNTER — Other Ambulatory Visit: Payer: Medicare Other | Admitting: Pharmacist

## 2023-03-26 NOTE — Progress Notes (Signed)
Care Coordination Call  Incoming call from patient regarding insurance/medication cost update. Unfortunately farxiga and mounjaro will elevate in cost at next fill. Patient describes $80 for mounjaro and next fill of farxiga also will be significantly more costly.  Patient has a good supply of farxiga for now, and did fill the 5mg  strength of mounjaro so will have 1 month supply of this.  Reviewed cost coverage options - patient not eligible for most manufacturer programs due to exceeding income threshold. No great cost options exist to specifically support medicare coverage gap.   Offered patient information that the closest replacement option for mounjaro will be ozempic (to utilize novonordisk patient assistance program).   Patient wishes to continue current medications for now.  Will outreach ~2 weeks to touchbase regarding cost and tolerability of mounjaro at 5mg  weekly dosing.  Appreciated updates and will continue to support patient for ongoing medication access.  Lynnda Shields, PharmD, BCPS Clinical Pharmacist [facility]

## 2023-03-26 NOTE — Patient Instructions (Signed)
Marcy Salvo,  Thank you for keeping me updated!! I am here to support you and make sure we have the best possible access to your medications. We will touch base in a few weeks over the phone, but you are welcome to call sooner if you have any questions/concerns.  Take care, Elmarie Shiley, PharmD, BCPS Clinical Pharmacist [facility]

## 2023-03-30 ENCOUNTER — Encounter: Payer: Self-pay | Admitting: Family Medicine

## 2023-03-30 ENCOUNTER — Telehealth: Payer: Self-pay | Admitting: Family Medicine

## 2023-03-30 MED ORDER — CEFDINIR 300 MG PO CAPS: 300.0000 mg | ORAL_CAPSULE | Freq: Two times a day (BID) | ORAL | 0 refills | Status: DC

## 2023-03-30 NOTE — Telephone Encounter (Signed)
Pt called back because he finished his z-pack a couple days ago was starting to feel better but he still has symptoms lingering and is wondering if he needs something called in? Please call pt to advise.

## 2023-03-30 NOTE — Telephone Encounter (Signed)
Meds ordered this encounter  ?Medications  ? cefdinir (OMNICEF) 300 MG capsule  ?  Sig: Take 1 capsule (300 mg total) by mouth 2 (two) times daily.  ?  Dispense:  14 capsule  ?  Refill:  0  ? ? ?

## 2023-03-30 NOTE — Telephone Encounter (Signed)
He stated he still had lingering cough and yellow mucus

## 2023-03-30 NOTE — Telephone Encounter (Signed)
Left a message asking what symptoms he is still having.

## 2023-04-02 NOTE — Telephone Encounter (Signed)
A My Chart message was sent to patient

## 2023-04-03 ENCOUNTER — Other Ambulatory Visit: Payer: Self-pay | Admitting: Family Medicine

## 2023-04-03 DIAGNOSIS — E118 Type 2 diabetes mellitus with unspecified complications: Secondary | ICD-10-CM

## 2023-04-11 ENCOUNTER — Telehealth: Payer: Self-pay | Admitting: Family Medicine

## 2023-04-11 MED ORDER — PREDNISONE 20 MG PO TABS
40.0000 mg | ORAL_TABLET | Freq: Every day | ORAL | 0 refills | Status: DC
Start: 2023-04-11 — End: 2023-06-05

## 2023-04-11 MED ORDER — HYDROCODONE BIT-HOMATROP MBR 5-1.5 MG/5ML PO SOLN: 5.0000 mL | Freq: Every evening | ORAL | 0 refills | Status: DC | PRN

## 2023-04-11 NOTE — Telephone Encounter (Signed)
Meds sent. If cough not better by Monday then willn eed CXR.   Meds ordered this encounter  Medications   predniSONE (DELTASONE) 20 MG tablet    Sig: Take 2 tablets (40 mg total) by mouth daily with breakfast.    Dispense:  10 tablet    Refill:  0   HYDROcodone bit-homatropine (HYCODAN) 5-1.5 MG/5ML syrup    Sig: Take 5 mLs by mouth at bedtime as needed for cough.    Dispense:  60 mL    Refill:  0

## 2023-04-11 NOTE — Telephone Encounter (Signed)
Patient called says he completed recent round antibiotics  Omnicef 300mg  for 10 days he would like to know if he could try steroids still coughing up yellow sputum please advise also cough syrup

## 2023-04-11 NOTE — Telephone Encounter (Signed)
Patient informed. 

## 2023-04-12 ENCOUNTER — Other Ambulatory Visit: Payer: Medicare Other | Admitting: Pharmacist

## 2023-04-12 DIAGNOSIS — N1831 Chronic kidney disease, stage 3a: Secondary | ICD-10-CM

## 2023-04-12 DIAGNOSIS — I10 Essential (primary) hypertension: Secondary | ICD-10-CM

## 2023-04-12 DIAGNOSIS — E118 Type 2 diabetes mellitus with unspecified complications: Secondary | ICD-10-CM

## 2023-04-12 DIAGNOSIS — I5030 Unspecified diastolic (congestive) heart failure: Secondary | ICD-10-CM

## 2023-04-12 NOTE — Progress Notes (Signed)
04/12/2023 Name: Tracy Bennett MRN: [id number] DOB: [birth year]  Chief Complaint  Patient presents with   Diabetes   Medication Assistance    Tracy Bennett is a 77 y.o. year old male who presented for a telephone visit.   They were referred to the pharmacist by their PCP for assistance in managing diabetes and medication access.    Subjective:  Care Team: Primary Care Provider: Agapito Games, MD ; Next Scheduled Visit: 06/05/23   Medication Access/Adherence  [phone] [place], [place] - [address] [place] [address] Phone: [phone] Fax: [phone]   Patient previously reported affordability concerns with their medications:   Patient stays very involved with investigating costs with his wellcare insurance for [employer] and farxiga. $128 [facility] m[facility]jaro  [facility]  $8,000 will need spent out of pocket to fulfill donut hole before reaching catastrophic coverage.  Patient reports access/transportation concerns to their pharmacy: No  Patient reports adherence concerns with their medications:  No    Diabetes:  Current medications: farxiga 10mg  daily,  mounjaro 5mg  weekly on Sundays, recently stopped metformin A1c 12.8 down to 6.5 with food and exercise changes Medications tried in the past: glipizide (recently discontinued d/t hypoglycemic episodes while gardening midday)  Current glucose readings: 115-120 before breakfast  Using traditional meter; testing 1-3 times daily  Patient denies hypoglycemic s/sx including dizziness, shakiness, sweating. Patient denies hyperglycemic symptoms including polyuria, polydipsia, polyphagia, nocturia, neuropathy, blurred vision.   Objective:  Lab Results  Component Value Date   HGBA1C 6.5 (A) 02/28/2023    Lab Results  Component Value Date   CREATININE 1.28 03/01/2023   BUN 15 03/01/2023   NA 141 03/01/2023   K 4.1 03/01/2023   CL  104 03/01/2023   CO2 26 03/01/2023    Lab Results  Component Value Date   CHOL 122 03/01/2023   HDL 36 (L) 03/01/2023   LDLCALC 67 03/01/2023   LDLDIRECT 80 11/22/2017   TRIG 100 03/01/2023   CHOLHDL 3.4 03/01/2023    Medications Reviewed Today     Reviewed by Gabriel Carina, RPH (Pharmacist) on 03/26/23 at 1544  Med List Status: <None>   Medication Order Taking? Sig Documenting Provider Last Dose Status Informant  Accu-Chek FastClix Lancets MISC [id number] No Dx DM E11.9 Check fasting blood sugar daily and after largest meal of the day a couple time a week. Agapito Games, MD Taking Active   ACCU-CHEK GUIDE test strip [id number] No CHECK FASTING BLOOD SUGAR EVERY MORNING. DX: E11.9 Agapito Games, MD Taking Active   azithromycin (ZITHROMAX) 250 MG tablet [id number]  2 Ttabs PO on Day 1, then one a day x 4 days. Agapito Games, MD  Active   dapagliflozin propanediol (FARXIGA) 10 MG TABS tablet [id number] No Take 1 tablet (10 mg total) by mouth daily before breakfast.  Patient taking differently: Take 10 mg by mouth daily before breakfast. Pt taking 5mg  until he starts taking 5 mg Magnus Ivan, MD Taking Active   finasteride (PROSCAR) 5 MG tablet [id number] No Take 1 tablet (5 mg total) by mouth daily. Agapito Games, MD Taking Active Self  furosemide (LASIX) 40 MG tablet [id number] No Take 0.5 tablets (20 mg total) by mouth daily.  Patient taking differently: Take 20 mg by mouth every other day.   Duke Salvia, MD Taking Active   loratadine (CLARITIN) 10 MG tablet [id number] No Take  10 mg by mouth daily as needed for allergies.  [provider] Taking Active Self  metFORMIN (GLUCOPHAGE-XR) 500 MG 24 hr tablet [id number] No TAKE 1 TABLET (500 MG TOTAL) BY MOUTH IN THE MORNING AND AT BEDTIME Agapito Games, MD Taking Active   Misc. Devices MISC [id number] No New bipap machine at current settings. [provider] Taking  Active   oxybutynin (DITROPAN-XL) 10 MG 24 hr tablet [id number] No Take 10 mg by mouth daily. Melene Plan, MD Taking Active   pantoprazole (PROTONIX) 40 MG tablet [id number] No TAKE 1 TABLET BY MOUTH EVERY DAY Agapito Games, MD Taking Active   simvastatin (ZOCOR) 40 MG tablet [id number] No TAKE 1 TABLET BY MOUTH DAILY AT 6 PM. Agapito Games, MD Taking Active   tamsulosin [facility]) 0.4 MG CAPS capsule [id number] No Take 0.4 mg by mouth daily.  [provider] Taking Active Self  tirzepatide [name]) 5 MG/0.5ML Pen [id number] No Inject 5 mg into the skin once a week.  Patient taking differently: Inject 5 mg into the skin once a week. Pt has not started 5 mg dosage still taking 2.5 mg   Agapito Games, MD Taking Active   valsartan-hydrochlorothiazide (DIOVAN-HCT) 160-12.5 MG tablet [id number] No TAKE 1 TABLET BY MOUTH EVERY DAY Agapito Games, MD Taking Active   Med List Note Raynelle Chary, CPhT 01/11/18 1554): Pt has BiPap              Assessment/Plan:   Diabetes: - Currently controlled - Reviewed long term cardiovascular and renal outcomes of uncontrolled blood sugar - Reviewed goal A1c, goal fasting, and goal 2 hour post prandial glucose - Recommend continue current regimen - which is now: mounjaro 5mg  weekly, farxiga 10mg  daily, and recently stopped metformin to streamline medications.  - Discussed costs with patient, will let us know if he decides that paying high copays during donut hole is not feasible. For now, patient is doing well on this regimen. - Increasing mounjaro dose to 7.5mg  weekly (after completing 30 days of 5mg  dose), is reasonable to consider if desiring more weight loss. Glucose well controlled at this time. - Reviewed patient eligibility for med access programs: medicare extra help/LIS is not eligible, and patient is uncertain on income threshold for 400% FPL of ozempic via novonordisk, but this can be a second option  to pursue if [name] is cost prohibitive. - Recommend to check glucose 1-3 times daily as his current practice   Follow Up Plan: 2-4 weeks/PRN  Lynnda Shields, PharmD, BCPS Clinical Pharmacist [facility]

## 2023-04-12 NOTE — Patient Instructions (Addendum)
Deshaun,  It was great speaking with you today! As discussed, continue your current regimen. Be sure to let us know if at any point you decide the high copays during this tier of your coverage is not sustainable for you.   After completing 4 weeks of mounjaro at 5mg  weekly, if all goes well, you can work with Dr. Linford [place] to increase to the next dose, 7.5mg  weekly. This would be primarily for additive weight loss, as your blood sugars are well controlled at this time. This medication does not cause risks of low blood sugars, so that is not a concern if you desire to increase to the next dosage.  Lastly - the grant program that opened for diabetes medicine has a pretty tight income threshold - to qualify, a 2 income household must be at or below $59,160 for the year.   If you have questions before 04/19/23 - you can call my direct line at [phone] (and leave a message if I cannot answer the call at the time).   If you have questions or need support after 04/19/23, the scheduler for our entire pharmacist team is: [phone]   Otherwise - I will touch base with you upon my return!  Take care, Elmarie Shiley, PharmD, BCPS Clinical Pharmacist [facility]

## 2023-04-13 ENCOUNTER — Telehealth: Payer: Self-pay | Admitting: Family Medicine

## 2023-04-13 MED ORDER — TIRZEPATIDE 7.5 MG/0.5ML ~~LOC~~ SOAJ
7.5000 mg | SUBCUTANEOUS | 0 refills | Status: DC
Start: 2023-04-13 — End: 2023-04-18

## 2023-04-13 NOTE — Telephone Encounter (Signed)
Please let him know there has been such a shortage we were trying to get ahead ot it because has been taking weeks for people to get their medicine.

## 2023-04-13 NOTE — Telephone Encounter (Signed)
Pt called.  Patient states his rx for [name] has been changed to 7.5mg (currently on 5 mg still has 3 more shots) and it was not discussed with him first. He states in his appointment  with Lynnda Shields she did mention at some point dosage needs to be increased but he never told her that he wanted increased.  Please clarify.

## 2023-04-13 NOTE — Progress Notes (Signed)
Meds ordered this encounter  Medications   tirzepatide (MOUNJARO) 7.5 MG/0.5ML Pen    Sig: Inject 7.5 mg into the skin once a week.    Dispense:  6 mL    Refill:  0    

## 2023-04-13 NOTE — Addendum Note (Signed)
Addended by: Nani Gasser D on: 04/13/2023 07:48 AM   Modules accepted: Orders

## 2023-04-18 ENCOUNTER — Telehealth: Payer: Self-pay | Admitting: Family Medicine

## 2023-04-18 DIAGNOSIS — N1831 Chronic kidney disease, stage 3a: Secondary | ICD-10-CM

## 2023-04-18 DIAGNOSIS — E118 Type 2 diabetes mellitus with unspecified complications: Secondary | ICD-10-CM

## 2023-04-18 DIAGNOSIS — I10 Essential (primary) hypertension: Secondary | ICD-10-CM

## 2023-04-18 DIAGNOSIS — I5030 Unspecified diastolic (congestive) heart failure: Secondary | ICD-10-CM

## 2023-04-18 MED ORDER — TIRZEPATIDE 7.5 MG/0.5ML ~~LOC~~ SOAJ
7.5000 mg | SUBCUTANEOUS | 0 refills | Status: DC
Start: 2023-04-18 — End: 2023-06-05

## 2023-04-18 NOTE — Telephone Encounter (Signed)
Corney states he has been lightheaded for a week. He states it was worse today. He believes it is his blood glucose. This morning fasting was 97 mg/dl. He is worried about the increased strength of Mounjaro and [place].

## 2023-04-18 NOTE — Telephone Encounter (Signed)
Patient called about his Mounjaro 7.5mg  and Farxiga 10mg  he would like clafication of dosage and also the price is high please advise he says he has been light lightheaded

## 2023-04-18 NOTE — Telephone Encounter (Signed)
Patient advised.

## 2023-04-19 NOTE — Telephone Encounter (Signed)
OK to take farxiga every other day for now.  He also needs to make sure that he is staying well-hydrated especially if he is doing a lot of yard work because that can affect dizziness as well.  He has been working with Thurston Hole on cost issues.

## 2023-04-19 NOTE — Telephone Encounter (Signed)
Attempted call to patient. Left voice mail message requesting a return call.  

## 2023-04-19 NOTE — Telephone Encounter (Signed)
Patient informed of recommendations. He will start [place] every other day and will stay well hydrated while working outdoors.

## 2023-05-23 DIAGNOSIS — H04123 Dry eye syndrome of bilateral lacrimal glands: Secondary | ICD-10-CM | POA: Diagnosis not present

## 2023-05-23 DIAGNOSIS — Z9842 Cataract extraction status, left eye: Secondary | ICD-10-CM | POA: Diagnosis not present

## 2023-05-23 DIAGNOSIS — Z961 Presence of intraocular lens: Secondary | ICD-10-CM | POA: Diagnosis not present

## 2023-05-23 DIAGNOSIS — Z9841 Cataract extraction status, right eye: Secondary | ICD-10-CM | POA: Diagnosis not present

## 2023-05-23 DIAGNOSIS — H0102A Squamous blepharitis right eye, upper and lower eyelids: Secondary | ICD-10-CM | POA: Diagnosis not present

## 2023-05-23 DIAGNOSIS — H0102B Squamous blepharitis left eye, upper and lower eyelids: Secondary | ICD-10-CM | POA: Diagnosis not present

## 2023-05-23 DIAGNOSIS — Z9889 Other specified postprocedural states: Secondary | ICD-10-CM | POA: Diagnosis not present

## 2023-05-23 LAB — HM DIABETES EYE EXAM

## 2023-06-05 ENCOUNTER — Ambulatory Visit (INDEPENDENT_AMBULATORY_CARE_PROVIDER_SITE_OTHER): Payer: Medicare Other | Admitting: Family Medicine

## 2023-06-05 ENCOUNTER — Encounter: Payer: Self-pay | Admitting: Family Medicine

## 2023-06-05 VITALS — BP 120/68 | HR 73 | Ht 70.0 in | Wt 307.0 lb

## 2023-06-05 DIAGNOSIS — I10 Essential (primary) hypertension: Secondary | ICD-10-CM

## 2023-06-05 DIAGNOSIS — E118 Type 2 diabetes mellitus with unspecified complications: Secondary | ICD-10-CM

## 2023-06-05 DIAGNOSIS — R42 Dizziness and giddiness: Secondary | ICD-10-CM

## 2023-06-05 LAB — POCT UA - MICROALBUMIN
Albumin/Creatinine Ratio, Urine, POC: <30
Creatinine, POC: 300 mg/dL
Microalbumin Ur, POC: 30 mg/L

## 2023-06-05 LAB — POCT GLYCOSYLATED HEMOGLOBIN (HGB A1C): Hemoglobin A1C: 7.5 % — AB (ref 4.0–5.6)

## 2023-06-05 MED ORDER — TIRZEPATIDE 10 MG/0.5ML ~~LOC~~ SOAJ
10.0000 mg | SUBCUTANEOUS | 2 refills | Status: DC
Start: 2023-06-05 — End: 2023-08-30

## 2023-06-05 MED ORDER — VALSARTAN-HYDROCHLOROTHIAZIDE 80-12.5 MG PO TABS: 1.0000 | ORAL_TABLET | Freq: Every day | ORAL | 0 refills | Status: DC

## 2023-06-05 NOTE — Patient Instructions (Signed)
We will bump up the Mounjaro to 10 mg so once you complete your medicine on the 7.5 please increase to 10 mg weekly. Recommend dropping the Farxiga down to 5 mg just to see if you notice any difference in how you are feeling over the next week. Try to check your blood pressure when you feel little dizzy or lightheaded to see if maybe it is dropping low.

## 2023-06-05 NOTE — Assessment & Plan Note (Signed)
A1C is up from previous but he I snow on Farxiga 10mg  every other day, as he was not feeling well when he was taking it daily he was feeling more tired and exhausted.  He states he does still have some 5 mg tabs at home so encouraged him to maybe go down to the 5 mg and see if he does feel better with that.  My hope is that if we can increase the Mounjaro to 10 mg and he tolerates it well then we can just discontinue the [place] completely.

## 2023-06-05 NOTE — Progress Notes (Signed)
Established Patient Office Visit  Subjective   Patient ID: Tracy Bennett, male    DOB: 06-08-1946  Age: 77 y.o. MRN: [id number]  Chief Complaint  Patient presents with   Diabetes    HPI  Diabetes - no hypoglycemic events. No wounds or sores that are not healing well. No increased thirst or urination. Checking glucose at home. Taking medications as prescribed without any side effects.  He just had his eye exam done in June.  He is up to 7.5 mg on the [facility].  Was sick in May and had a round of prednisone. Taking [name] every other day but was making him feel bad.  He is concerned because most days he spends a fair amount of time working outside he says pretty quickly he will get really sweaty and diaphoretic and then come in the house and try to rehydrate.  He says that he notices it mostly, late to midday.  He says he tries to get outside earlier in the morning before it gets too hot.  But he is also noticed episodes where he will bend over to get something out of the freezer and then stand back up and feel a little dizzy.  Or feeling like most like he is going to pass out.  He thought maybe it was his blood sugar levels.  Hypertension- Pt denies chest pain, SOB, dizziness, or heart palpitations.  Taking meds as directed w/o problems.  Denies medication side effects.       ROS    Objective:     BP 120/68   Pulse 73   Ht 5\' 10"  (1.778 m)   Wt (!) 307 lb (139.3 kg)   SpO2 98%   BMI 44.05 kg/m    Physical Exam Constitutional:      Appearance: He is well-developed.  HENT:     Head: Normocephalic and atraumatic.  Cardiovascular:     Rate and Rhythm: Normal rate and regular rhythm.     Heart sounds: Normal heart sounds.  Pulmonary:     Effort: Pulmonary effort is normal.     Breath sounds: Normal breath sounds.  Skin:    General: Skin is warm and dry.  Neurological:     Mental Status: He is alert and oriented to person, place, and time.  Psychiatric:        Behavior:  Behavior normal.      Results for orders placed or performed in visit on 06/05/23  POCT glycosylated hemoglobin (Hb A1C)  Result Value Ref Range   Hemoglobin A1C 7.5 (A) 4.0 - 5.6 %   HbA1c POC (<> result, manual entry)     HbA1c, POC (prediabetic range)     HbA1c, POC (controlled diabetic range)    POCT UA - Microalbumin  Result Value Ref Range   Microalbumin Ur, POC 30 mg/L   Creatinine, POC 300 mg/dL   Albumin/Creatinine Ratio, Urine, POC <30   HM DIABETES EYE EXAM  Result Value Ref Range   HM Diabetic Eye Exam No Retinopathy No Retinopathy      The ASCVD Risk score (Arnett DK, et al., 2019) failed to calculate for the following reasons:   The valid total cholesterol range is 130 to 320 mg/dL    Assessment & Plan:   Problem List Items Addressed This Visit       Cardiovascular and Mediastinum   Hypertension    Orthostatics here in the office today.  I still am suspicious of his blood pressure being a  little low especially when he becomes more diaphoretic with working out in the heat.  Though he has been trying very hard to hydrate.   He has been holding his furosemide and only taking it if he notices that he is getting some ankle swelling.      Relevant Medications   valsartan-hydrochlorothiazide (DIOVAN-HCT) 80-12.5 MG tablet     Endocrine   Controlled diabetes mellitus type 2 with complications (HCC) - Primary    A1C is up from previous but he I snow on Farxiga 10mg  every other day, as he was not feeling well when he was taking it daily he was feeling more tired and exhausted.  He states he does still have some 5 mg tabs at home so encouraged him to maybe go down to the 5 mg and see if he does feel better with that.  My hope is that if we can increase the Mounjaro to 10 mg and he tolerates it well then we can just discontinue the [place] completely.      Relevant Medications   tirzepatide (MOUNJARO) 10 MG/0.5ML Pen   valsartan-hydrochlorothiazide (DIOVAN-HCT)  80-12.5 MG tablet   Other Relevant Orders   POCT glycosylated hemoglobin (Hb A1C) (Completed)   POCT UA - Microalbumin (Completed)   Other Visit Diagnoses     Lightheaded          Lightheadedness-we discussed checking his blood pressures at home when he has this happen.  I think it could possibly be a blood pressure issue blood pressure looks fantastic today at 120/68 but I am wondering if when he is outside and sweating and active if it is actually dropping a little lower and he is feeling it.  He has had a couple times where blood sugars were in the 80s so we did discuss getting adequate protein in.  He will often eat breakfast around 830 but then not e at lunch until about 2 or 3 in the afternoon.  So encouraged him to have a late morning midday snack that is high in protein to see if that helps as well.  He has lost about 14 pounds since he was last here and that may be contributing to the drop in blood pressure as well.  We did do orthostatics here in the office.  Sitting blood pressure was 96/54, standing was 115/48, and laying down was 95/40.  I am going to decrease his blood pressure pill by decreasing the valsartan component to 80 mg.  He is already holding his Lasix.  Return in about 3 months (around 09/05/2023) for Diabetes follow-up, Hypertension.   I spent 35 minutes on the day of the encounter to include pre-visit record review, face-to-face time with the patient and post visit ordering of test.   Nani Gasser, MD

## 2023-06-05 NOTE — Assessment & Plan Note (Addendum)
Orthostatics here in the office today.  I still am suspicious of his blood pressure being a little low especially when he becomes more diaphoretic with working out in the heat.  Though he has been trying very hard to hydrate.   He has been holding his furosemide and only taking it if he notices that he is getting some ankle swelling.

## 2023-06-11 ENCOUNTER — Telehealth: Payer: Self-pay | Admitting: Family Medicine

## 2023-06-11 NOTE — Telephone Encounter (Signed)
Patient called in regards to BP and Pulse he took this morning He stated the readings were 119/84 pulse 61 And 119/86 and pulse was 95

## 2023-06-11 NOTE — Telephone Encounter (Signed)
That looks so much better!!!! That is awesome!!!

## 2023-06-12 NOTE — Telephone Encounter (Signed)
Attempted call to patient. Left voice mail message requesting a return call.  

## 2023-06-26 DIAGNOSIS — Z86008 Personal history of in-situ neoplasm of other site: Secondary | ICD-10-CM | POA: Diagnosis not present

## 2023-06-26 DIAGNOSIS — D485 Neoplasm of uncertain behavior of skin: Secondary | ICD-10-CM | POA: Diagnosis not present

## 2023-06-26 DIAGNOSIS — L218 Other seborrheic dermatitis: Secondary | ICD-10-CM | POA: Diagnosis not present

## 2023-06-26 DIAGNOSIS — D0462 Carcinoma in situ of skin of left upper limb, including shoulder: Secondary | ICD-10-CM | POA: Diagnosis not present

## 2023-06-26 DIAGNOSIS — L570 Actinic keratosis: Secondary | ICD-10-CM | POA: Diagnosis not present

## 2023-06-26 DIAGNOSIS — Z129 Encounter for screening for malignant neoplasm, site unspecified: Secondary | ICD-10-CM | POA: Diagnosis not present

## 2023-06-29 ENCOUNTER — Encounter: Payer: Self-pay | Admitting: Family Medicine

## 2023-07-19 ENCOUNTER — Telehealth: Payer: Self-pay

## 2023-07-19 NOTE — Telephone Encounter (Signed)
Encourage him to test AM on Fri AM and let us know if Positive.  Some people test neg for first copule of days.

## 2023-07-19 NOTE — Telephone Encounter (Signed)
Tracy Bennett states he has a sore throat and cough times 1 day. His wife does have Covid. He tested and he doesn't have Covid. He wanted to know if he should be on Covid medication.

## 2023-07-20 ENCOUNTER — Encounter: Payer: Self-pay | Admitting: Family Medicine

## 2023-07-20 ENCOUNTER — Ambulatory Visit (INDEPENDENT_AMBULATORY_CARE_PROVIDER_SITE_OTHER): Payer: Medicare Other | Admitting: Family Medicine

## 2023-07-20 VITALS — BP 123/67 | HR 79 | Resp 20 | Ht 70.0 in | Wt 302.0 lb

## 2023-07-20 DIAGNOSIS — U071 COVID-19: Secondary | ICD-10-CM | POA: Insufficient documentation

## 2023-07-20 LAB — POC COVID19 BINAXNOW: SARS Coronavirus 2 Ag: POSITIVE — AB

## 2023-07-20 MED ORDER — HYDROCOD POLI-CHLORPHE POLI ER 10-8 MG/5ML PO SUER: 5.0000 mL | Freq: Two times a day (BID) | ORAL | 0 refills | Status: DC | PRN

## 2023-07-20 MED ORDER — NIRMATRELVIR/RITONAVIR (PAXLOVID)TABLET
3.0000 | ORAL_TABLET | Freq: Two times a day (BID) | ORAL | 0 refills | Status: AC
Start: 2023-07-20 — End: 2023-07-25

## 2023-07-20 MED ORDER — ALBUTEROL SULFATE HFA 108 (90 BASE) MCG/ACT IN AERS
2.0000 | INHALATION_SPRAY | Freq: Four times a day (QID) | RESPIRATORY_TRACT | 0 refills | Status: DC | PRN
Start: 2023-07-20 — End: 2024-04-01

## 2023-07-20 NOTE — Patient Instructions (Addendum)
Stop cholesterol medicine while taking this covid medicine  Take tylenol for aches and pains   Use albuterol inhaler as you need it for wheezing and shortness of breath   If you don't feel well go to ED

## 2023-07-20 NOTE — Assessment & Plan Note (Signed)
-   patient tested positive for covid. Given paxlovid, discussed holding cholesterol medication while on paxlovid - given cough medicine  - given inhaler for wheezing - discussed ED precautions

## 2023-07-20 NOTE — Progress Notes (Signed)
Acute Office Visit  Subjective:     Patient ID: Tracy Bennett, male    DOB: 29-Apr-1946, 77 y.o.   MRN: [id number]  Chief Complaint  Patient presents with   Covid Exposure    Pts wife tested positive 2days ago he is having the same symptoms as her coughing, congested     HPI Patient is in today for concerns of covid. Wife diagnosed and was in the hospital. He notes coughing, congestion.  Review of Systems  Constitutional:  Negative for chills and fever.  HENT:  Positive for congestion.   Respiratory:  Positive for cough. Negative for shortness of breath.   Cardiovascular:  Negative for chest pain.  Neurological:  Negative for headaches.        Objective:    BP 123/67 (BP Location: Left Arm, Patient Position: Sitting, Cuff Size: Large)   Pulse 79   Resp 20   Ht 5\' 10"  (1.778 m)   Wt (!) 302 lb (137 kg)   SpO2 98%   BMI 43.33 kg/m    Physical Exam Vitals and nursing note reviewed.  Constitutional:      General: He is not in acute distress.    Appearance: Normal appearance.  HENT:     Head: Normocephalic and atraumatic.     Right Ear: External ear normal.     Left Ear: External ear normal.     Nose: Congestion present.     Mouth/Throat:     Pharynx: Posterior oropharyngeal erythema present.  Eyes:     Conjunctiva/sclera: Conjunctivae normal.  Cardiovascular:     Rate and Rhythm: Normal rate and regular rhythm.  Pulmonary:     Effort: Pulmonary effort is normal.     Breath sounds: Normal breath sounds.  Neurological:     General: No focal deficit present.     Mental Status: He is alert and oriented to person, place, and time.  Psychiatric:        Mood and Affect: Mood normal.        Behavior: Behavior normal.        Thought Content: Thought content normal.        Judgment: Judgment normal.     Results for orders placed or performed in visit on 07/20/23  POC COVID-19  Result Value Ref Range   SARS Coronavirus 2 Ag Positive (A) Negative         Assessment & Plan:   Problem List Items Addressed This Visit       Other   COVID-19 - Primary    - patient tested positive for covid. Given paxlovid, discussed holding cholesterol medication while on paxlovid - given cough medicine  - given inhaler for wheezing - discussed ED precautions      Relevant Medications   nirmatrelvir/ritonavir (PAXLOVID) 20 x 150 MG & 10 x 100MG  TABS   albuterol (VENTOLIN HFA) 108 (90 Base) MCG/ACT inhaler   Other Relevant Orders   POC COVID-19 (Completed)    Meds ordered this encounter  Medications   nirmatrelvir/ritonavir (PAXLOVID) 20 x 150 MG & 10 x 100MG  TABS    Sig: Take 3 tablets by mouth 2 (two) times daily for 5 days. (Take nirmatrelvir 150 mg two tablets twice daily for 5 days and ritonavir 100 mg one tablet twice daily for 5 days) Patient GFR is 58    Dispense:  30 tablet    Refill:  0   albuterol (VENTOLIN HFA) 108 (90 Base) MCG/ACT inhaler    Sig: Inhale  2 puffs into the lungs every 6 (six) hours as needed for wheezing or shortness of breath.    Dispense:  8 g    Refill:  0   chlorpheniramine-HYDROcodone (TUSSIONEX) 10-8 MG/5ML    Sig: Take 5 mLs by mouth every 12 (twelve) hours as needed for cough (cough, will cause drowsiness.).    Dispense:  120 mL    Refill:  0    No follow-ups on file.  Charlton Amor, DO

## 2023-07-20 NOTE — Telephone Encounter (Signed)
Patient seen in office. Covid was positive.

## 2023-08-01 DIAGNOSIS — H6063 Unspecified chronic otitis externa, bilateral: Secondary | ICD-10-CM | POA: Diagnosis not present

## 2023-08-02 ENCOUNTER — Other Ambulatory Visit: Payer: Self-pay | Admitting: Family Medicine

## 2023-08-02 DIAGNOSIS — I10 Essential (primary) hypertension: Secondary | ICD-10-CM

## 2023-08-07 ENCOUNTER — Telehealth: Payer: Self-pay | Admitting: Family Medicine

## 2023-08-07 DIAGNOSIS — E118 Type 2 diabetes mellitus with unspecified complications: Secondary | ICD-10-CM

## 2023-08-07 NOTE — Telephone Encounter (Signed)
Pt called. Since there are no more refills left on the Farxiga 5 mg does pcp want him to get med refilled since pcp told him previously to continue taking this med until it ran out.

## 2023-08-08 ENCOUNTER — Encounter: Payer: Self-pay | Admitting: Family Medicine

## 2023-08-08 MED ORDER — DAPAGLIFLOZIN PROPANEDIOL 5 MG PO TABS
5.0000 mg | ORAL_TABLET | Freq: Every day | ORAL | 1 refills | Status: DC
Start: 2023-08-08 — End: 2023-08-09

## 2023-08-08 NOTE — Telephone Encounter (Signed)
Will fwd to pcp, and have her review the OV note from 06/05/2023  before I return his call about how to proceed.  Endocrine    Controlled diabetes mellitus type 2 with complications (HCC) - Primary      A1C is up from previous but he I snow on Farxiga 10mg  every other day, as he was not feeling well when he was taking it daily he was feeling more tired and exhausted.  He states he does still have some 5 mg tabs at home so encouraged him to maybe go down to the 5 mg and see if he does feel better with that.  My hope is that if we can increase the Mounjaro to 10 mg and he tolerates it well then we can just discontinue the [place] completely.

## 2023-08-08 NOTE — Telephone Encounter (Signed)
Meds ordered this encounter  Medications   dapagliflozin propanediol (FARXIGA) 5 MG TABS tablet    Sig: Take 1 tablet (5 mg total) by mouth daily before breakfast.    Dispense:  90 tablet    Refill:  1    DX Code Needed  .

## 2023-08-08 NOTE — Telephone Encounter (Signed)
Patient called requesting an update. Stated he is unhappy with the time it has taken to receive a response.

## 2023-08-09 ENCOUNTER — Other Ambulatory Visit: Payer: Self-pay

## 2023-08-09 DIAGNOSIS — E118 Type 2 diabetes mellitus with unspecified complications: Secondary | ICD-10-CM

## 2023-08-09 MED ORDER — DAPAGLIFLOZIN PROPANEDIOL 5 MG PO TABS
5.0000 mg | ORAL_TABLET | Freq: Every day | ORAL | 1 refills | Status: AC
Start: 2023-08-09 — End: ?

## 2023-08-10 NOTE — Telephone Encounter (Signed)
Patient informed. 

## 2023-08-30 ENCOUNTER — Encounter: Payer: Self-pay | Admitting: Family Medicine

## 2023-08-30 ENCOUNTER — Ambulatory Visit (INDEPENDENT_AMBULATORY_CARE_PROVIDER_SITE_OTHER): Payer: Medicare Other | Admitting: Family Medicine

## 2023-08-30 VITALS — BP 124/70 | HR 78 | Ht 70.0 in | Wt 289.0 lb

## 2023-08-30 DIAGNOSIS — S81812A Laceration without foreign body, left lower leg, initial encounter: Secondary | ICD-10-CM

## 2023-08-30 DIAGNOSIS — E118 Type 2 diabetes mellitus with unspecified complications: Secondary | ICD-10-CM | POA: Diagnosis not present

## 2023-08-30 DIAGNOSIS — I10 Essential (primary) hypertension: Secondary | ICD-10-CM

## 2023-08-30 LAB — POCT GLYCOSYLATED HEMOGLOBIN (HGB A1C): Hemoglobin A1C: 5.9 % — AB (ref 4.0–5.6)

## 2023-08-30 MED ORDER — DOXYCYCLINE HYCLATE 100 MG PO TABS: 100.0000 mg | ORAL_TABLET | Freq: Two times a day (BID) | ORAL | 0 refills | Status: DC

## 2023-08-30 MED ORDER — TIRZEPATIDE 12.5 MG/0.5ML ~~LOC~~ SOAJ: 12.5000 mg | SUBCUTANEOUS | 1 refills | Status: DC

## 2023-08-30 MED ORDER — MUPIROCIN 2 % EX OINT
TOPICAL_OINTMENT | Freq: Two times a day (BID) | CUTANEOUS | 0 refills | Status: DC
Start: 2023-08-30 — End: 2023-12-03

## 2023-08-30 NOTE — Assessment & Plan Note (Signed)
Pressure looks fantastic today.  Some trace ankle edema but no significant sign of volume overload.  We are going to discontinue the [place] because of cost now that his A1c is really well-controlled but he may need to monitor for swelling and he may need to use his furosemide occasionally.

## 2023-08-30 NOTE — Assessment & Plan Note (Signed)
A1C looks great today at 5.9.  We discussed going up on the [facility] and discontinuing the [place] and following up in 90 days.  I actually think he will do really well he is really made some great changes and I think he will continue to lose weight.  I would love to see him get closer to about 250 pounds.  Lab Results  Component Value Date   HGBA1C 5.9 (A) 08/30/2023

## 2023-08-30 NOTE — Assessment & Plan Note (Signed)
BMI is down to 41 which is absolutely fantastic.  He really is doing great my hope is that he will continue to do well and we can maybe even be able to decrease some of his medications.

## 2023-08-30 NOTE — Progress Notes (Signed)
Acute Office Visit  Subjective:     Patient ID: Tracy Bennett, male    DOB: [birth year], 77 y.o.   MRN: [id number]  Chief Complaint  Patient presents with   Fall    HPI Patient is in today for leg injury after falling in the yard. Lacerated leg in yard about 4 days ago.  He has been getting some mostly serous drainage from the wound and it has been tender.  Yesterday he saw some red streaks that he called to make the appointment but today he says it actually looks a little better he has not noticed any pus from the wound.  Diabetes - no hypoglycemic events. No wounds or sores that are not healing well. No increased thirst or urination. Checking glucose at home. Taking medications as prescribed without any side effects.  He says he is really doing great with the [facility] he is tolerating it well without any side effects he has not even had any issues with constipation he has noticed that his blood sugars are running much better consistently under 130.  He says it is really been easier to eat healthy and control his portion sizes he says he really does not want as much bread and sweets and so in general he is eating better as well he says he has noticed a lot of weight loss in his abdomen and he feels physically better.  He is hopeful to maybe be able to get off of the [place] because of the cost of both drugs.   .  ROS      Objective:    BP 124/70   Pulse 78   Ht 5\' 10"  (1.778 m)   Wt 289 lb (131.1 kg)   SpO2 99%   BMI 41.47 kg/m    Physical Exam Vitals and nursing note reviewed.  Constitutional:      Appearance: Normal appearance.  HENT:     Head: Normocephalic and atraumatic.  Eyes:     Conjunctiva/sclera: Conjunctivae normal.  Cardiovascular:     Rate and Rhythm: Normal rate and regular rhythm.  Pulmonary:     Effort: Pulmonary effort is normal.     Breath sounds: Normal breath sounds.  Skin:    General: Skin is warm and dry.     Comments: He does have a skin  tear that is in a U-shaped on the left anterior shin just some mild tenderness, serous drainage.  Some trace edema around that ankle.  No red streaking or significant erythema.  Neurological:     Mental Status: He is alert.  Psychiatric:        Mood and Affect: Mood normal.     Results for orders placed or performed in visit on 08/30/23  POCT HgB A1C  Result Value Ref Range   Hemoglobin A1C 5.9 (A) 4.0 - 5.6 %   HbA1c POC (<> result, manual entry)     HbA1c, POC (prediabetic range)     HbA1c, POC (controlled diabetic range)          Assessment & Plan:   Problem List Items Addressed This Visit       Cardiovascular and Mediastinum   Hypertension    Pressure looks fantastic today.  Some trace ankle edema but no significant sign of volume overload.  We are going to discontinue the [place] because of cost now that his A1c is really well-controlled but he may need to monitor for swelling and he may need to use his furosemide occasionally.  Endocrine   Controlled diabetes mellitus type 2 with complications (HCC) - Primary    A1C looks great today at 5.9.  We discussed going up on the [facility] and discontinuing the [place] and following up in 90 days.  I actually think he will do really well he is really made some great changes and I think he will continue to lose weight.  I would love to see him get closer to about 250 pounds.  Lab Results  Component Value Date   HGBA1C 5.9 (A) 08/30/2023         Relevant Medications   tirzepatide (MOUNJARO) 12.5 MG/0.5ML Pen   Other Relevant Orders   POCT HgB A1C (Completed)     Other   Severe obesity (BMI >= 40) (HCC)    BMI is down to 41 which is absolutely fantastic.  He really is doing great my hope is that he will continue to do well and we can maybe even be able to decrease some of his medications.      Relevant Medications   tirzepatide (MOUNJARO) 12.5 MG/0.5ML Pen   Other Visit Diagnoses     Laceration of left lower  extremity, initial encounter       Relevant Medications   mupirocin ointment (BACTROBAN) 2 %       Leg laceration-recommend application of mupirocin ointment twice a day.  If he feels like it is getting more drainage or red streaks over the weekend then please fill the prescription for doxycycline and start it.  Let us know if not healing well.  Meds ordered this encounter  Medications   mupirocin ointment (BACTROBAN) 2 %    Sig: Apply topically 2 (two) times daily. X 5 days    Dispense:  30 g    Refill:  0   doxycycline (VIBRA-TABS) 100 MG tablet    Sig: Take 1 tablet (100 mg total) by mouth 2 (two) times daily.    Dispense:  10 tablet    Refill:  0   tirzepatide (MOUNJARO) 12.5 MG/0.5ML Pen    Sig: Inject 12.5 mg into the skin once a week.    Dispense:  6 mL    Refill:  1    No follow-ups on file.  Nani Gasser, MD

## 2023-08-31 ENCOUNTER — Other Ambulatory Visit: Payer: Self-pay | Admitting: Family Medicine

## 2023-09-06 ENCOUNTER — Ambulatory Visit: Payer: Medicare Other | Admitting: Family Medicine

## 2023-09-06 ENCOUNTER — Other Ambulatory Visit: Payer: Self-pay | Admitting: Family Medicine

## 2023-09-06 DIAGNOSIS — E785 Hyperlipidemia, unspecified: Secondary | ICD-10-CM

## 2023-09-08 ENCOUNTER — Other Ambulatory Visit: Payer: Self-pay | Admitting: Family Medicine

## 2023-09-08 DIAGNOSIS — E118 Type 2 diabetes mellitus with unspecified complications: Secondary | ICD-10-CM

## 2023-09-20 DIAGNOSIS — Z23 Encounter for immunization: Secondary | ICD-10-CM | POA: Diagnosis not present

## 2023-09-25 DIAGNOSIS — L570 Actinic keratosis: Secondary | ICD-10-CM | POA: Diagnosis not present

## 2023-09-25 DIAGNOSIS — D0462 Carcinoma in situ of skin of left upper limb, including shoulder: Secondary | ICD-10-CM | POA: Diagnosis not present

## 2023-10-18 ENCOUNTER — Other Ambulatory Visit: Payer: Self-pay | Admitting: Internal Medicine

## 2023-11-02 ENCOUNTER — Other Ambulatory Visit: Payer: Self-pay | Admitting: Internal Medicine

## 2023-11-19 ENCOUNTER — Encounter: Payer: Self-pay | Admitting: Family Medicine

## 2023-11-19 ENCOUNTER — Ambulatory Visit (INDEPENDENT_AMBULATORY_CARE_PROVIDER_SITE_OTHER): Payer: Medicare Other | Admitting: Family Medicine

## 2023-11-19 VITALS — Ht 69.0 in | Wt 280.0 lb

## 2023-11-19 DIAGNOSIS — Z0000 Encounter for general adult medical examination without abnormal findings: Secondary | ICD-10-CM

## 2023-11-19 NOTE — Progress Notes (Signed)
Subjective:   Tracy Bennett is a 77 y.o. male who presents for Medicare Annual/Subsequent preventive examination.  Visit Complete: Virtual I connected with  Tracy Bennett on 11/19/23 by a audio enabled telemedicine application and verified that I am speaking with the correct person using two identifiers.  Patient Location: Home  Provider Location: Office/Clinic  I discussed the limitations of evaluation and management by telemedicine. The patient expressed understanding and agreed to proceed.  Vital Signs: Because this visit was a virtual/telehealth visit, some criteria may be missing or patient reported. Any vitals not documented were not able to be obtained and vitals that have been documented are patient reported.  Patient Medicare AWV questionnaire was completed by the patient on 12/21; I have confirmed that all information answered by patient is correct and no changes since this date.  Cardiac Risk Factors include: advanced age (>32men, >72 women);male gender;dyslipidemia;obesity (BMI >30kg/m2)     Objective:    Today's Vitals   11/19/23 0808  Weight: 280 lb (127 kg)  Height: 5\' 9"  (1.753 m)   Body mass index is 41.35 kg/m.     11/19/2023    8:19 AM 11/14/2022    8:19 AM 10/21/2021   11:27 AM 08/11/2019    9:05 AM 08/05/2018    9:29 AM 01/21/2018   12:28 PM 12/15/2015    8:41 AM  Advanced Directives  Does Patient Have a Medical Advance Directive? Yes Yes Yes Yes Yes Yes No  Type of Estate agent of [place];Living will Living will Living will;Healthcare Power of Attorney Living will;Healthcare Power of Attorney Living will Healthcare Power of [place];Living will   Does patient want to make changes to medical advance directive? No - Patient declined No - Patient declined  No - Patient declined Yes (MAU/Ambulatory/Procedural Areas - Information given)    Copy of Healthcare Power of Attorney in Chart?    No - copy requested  No - copy requested    Would patient like information on creating a medical advance directive?       No - patient declined information    Current Medications (verified) Outpatient Encounter Medications as of 11/19/2023  Medication Sig   Accu-Chek FastClix Lancets MISC Dx DM E11.9 Check fasting blood sugar daily and after largest meal of the day a couple time a week.   ACCU-CHEK GUIDE test strip CHECK FASTING BLOOD SUGAR EVERY MORNING. DX: E11.9   finasteride (PROSCAR) 5 MG tablet Take 1 tablet (5 mg total) by mouth daily.   furosemide (LASIX) 40 MG tablet TAKE 1 TABLET BY MOUTH EVERY OTHER DAY   loratadine (CLARITIN) 10 MG tablet Take 10 mg by mouth daily as needed for allergies.    Misc. Devices MISC New bipap machine at current settings.   oxybutynin (DITROPAN-XL) 10 MG 24 hr tablet Take 10 mg by mouth daily.   pantoprazole (PROTONIX) 40 MG tablet TAKE 1 TABLET BY MOUTH EVERY DAY   simvastatin (ZOCOR) 40 MG tablet TAKE 1 TABLET BY MOUTH DAILY AT 6 PM.   tamsulosin (FLOMAX) 0.4 MG CAPS capsule Take 0.4 mg by mouth daily.    tirzepatide (MOUNJARO) 12.5 MG/0.5ML Pen Inject 12.5 mg into the skin once a week.   valsartan-hydrochlorothiazide (DIOVAN-HCT) 80-12.5 MG tablet TAKE 1 TABLET BY MOUTH DAILY   albuterol (VENTOLIN HFA) 108 (90 Base) MCG/ACT inhaler Inhale 2 puffs into the lungs every 6 (six) hours as needed for wheezing or shortness of breath. (Patient not taking: Reported on 11/19/2023)   doxycycline (VIBRA-TABS)  100 MG tablet Take 1 tablet (100 mg total) by mouth 2 (two) times daily. (Patient not taking: Reported on 11/19/2023)   MOUNJARO 10 MG/0.5ML Pen ADMINISTER 10 MG UNDER THE SKIN 1 TIME A WEEK   mupirocin ointment (BACTROBAN) 2 % Apply topically 2 (two) times daily. X 5 days (Patient not taking: Reported on 11/19/2023)   No facility-administered encounter medications on file as of 11/19/2023.    Allergies (verified) Amoxicillin, Iodinated contrast media, and Lisinopril   History: Past Medical  History:  Diagnosis Date   Allergy    Arthritis    Community acquired pneumonia of left lower lobe of lung 05/15/2018   Dyspnea    with exertion   Dysrhythmia    frequent PVC's   Frequent urination    GERD (gastroesophageal reflux disease)    History of kidney stones    Hyperlipidemia    Hypertension    Kidney stones    x 4    Sleep apnea    BiPAP   Past Surgical History:  Procedure Laterality Date   CARPAL TUNNEL RELEASE     Right    CERVICAL FUSION     cervical fusion times 2   EYE SURGERY     FOOT TENDON SURGERY  11/14/2011   Dr. Lajoyce Corners   FRACTURE SURGERY     HERNIA REPAIR     left knee surgery     ACL repair    LIPOMA EXCISION Bilateral    on back   MASS EXCISION N/A 01/24/2018   Procedure: EXCISION OF BACK MASS;  Surgeon: Harriette Bouillon, MD;  Location: [facility] OR;  Service: General;  Laterality: N/A;   SPINE SURGERY     UMBILICAL HERNIA REPAIR     Family History  Problem Relation Age of Onset   COPD Mother    Hypertension Mother    Emphysema Mother    Social History   Socioeconomic History   Marital status: Married    Spouse name: Rose    Number of children: 1   Years of education: 14   Highest education level: Some college, no degree  Occupational History   Occupation: Retired     Comment: [employer]   Tobacco Use   Smoking status: Former    Current packs/day: 0.00    Types: Cigarettes    Quit date: 02/09/1980    Years since quitting: 43.8   Smokeless tobacco: Former  Building services engineer status: Never Used  Substance and Sexual Activity   Alcohol use: Yes    Comment: occasionally   Drug use: No   Sexual activity: Yes  Other Topics Concern   Not on file  Social History Narrative   Lives with your wife. He has one child. He enjoys singing and yard work.   Social Drivers of Corporate investment banker Strain: Low Risk  (11/19/2023)   Overall Financial Resource Strain (CARDIA)    Difficulty of Paying Living Expenses: Not hard at all  Food  Insecurity: No Food Insecurity (11/19/2023)   Hunger Vital Sign    Worried About Running Out of Food in the Last Year: Never true    Ran Out of Food in the Last Year: Never true  Transportation Needs: No Transportation Needs (11/19/2023)   PRAPARE - Administrator, Civil Service (Medical): No    Lack of Transportation (Non-Medical): No  Physical Activity: Insufficiently Active (11/19/2023)   Exercise Vital Sign    Days of Exercise per Week: 2  days    Minutes of Exercise per Session: 60 min  Stress: No Stress Concern Present (11/19/2023)   [employer] of Occupational Health - Occupational Stress Questionnaire    Feeling of Stress : Not at all  Social Connections: Socially Integrated (11/19/2023)   Social Connection and Isolation Panel [NHANES]    Frequency of Communication with Friends and Family: More than three times a week    Frequency of Social Gatherings with Friends and Family: More than three times a week    Attends Religious Services: 1 to 4 times per year    Active Member of [employer] or Organizations: Yes    Attends Engineer, structural: More than 4 times per year    Marital Status: Married    Tobacco Counseling Counseling given: Not Answered   Clinical Intake:  Pre-visit preparation completed: Yes  Pain : No/denies pain     BMI - recorded: 41.4 Nutritional Status: BMI > 30  Obese Nutritional Risks: None Diabetes: No  How often do you need to have someone help you when you read instructions, pamphlets, or other written materials from your doctor or pharmacy?: 1 - Never What is the last grade level you completed in school?: 14  Interpreter Needed?: No      Activities of Daily Living    11/19/2023    8:10 AM 11/17/2023    7:25 AM  In your present state of health, do you have any difficulty performing the following activities:  Hearing? 0 0  Vision? 0 0  Difficulty concentrating or making decisions? 0 0  Walking or climbing  stairs? 0 0  Dressing or bathing? 0 0  Doing errands, shopping? 0 0  Preparing Food and eating ? N N  Using the Toilet? N N  In the past six months, have you accidently leaked urine? N N  Do you have problems with loss of bowel control? N N  Managing your Medications? N N  Managing your Finances? N N  Housekeeping or managing your Housekeeping? N N    Patient Care Team: Agapito Games, MD as PCP - General (Family Medicine) Reesa Chew, MD as Referring Physician (Gastroenterology) Duke Salvia, MD as Consulting Physician (Cardiology) Melene Plan, MD as Referring Physician (Urology) Loren Racer, MD as Referring Physician (Ophthalmology)   Indicate any recent Medical Services you may have received from other than [facility] providers in the past year (date may be approximate).     Assessment:   This is a routine wellness examination for Tracy Bennett.  Hearing/Vision screen Hearing Screening - Comments:: Unable to test, grossly intact Vision Screening - Comments:: Unable to test, grossly intact. Does not wear glasses.    Goals Addressed             This Visit's Progress    Set My Weight Loss Goal          Goal is to weigh 240-250 pounds.  Has lost about 60 pounds.  Down 6 waist sizes.         Depression Screen    11/19/2023    8:19 AM 06/05/2023    9:28 AM 02/28/2023    9:37 AM 11/14/2022    8:22 AM 05/25/2022    9:39 AM 02/21/2022    9:16 AM 08/25/2021    4:17 PM  PHQ 2/9 Scores  PHQ - 2 Score 0 0 0 0 0 0 0    Fall Risk    11/19/2023    8:20 AM  11/17/2023    7:25 AM 06/05/2023    9:28 AM 02/28/2023    9:37 AM 11/14/2022    8:22 AM  Fall Risk   Falls in the past year? 0 0 0 0 0  Number falls in past yr: 0  0 0 0  Injury with Fall? 0 0 0 0 0  Risk for fall due to : No Fall Risks  No Fall Risks No Fall Risks No Fall Risks  Follow up   Falls evaluation completed Falls evaluation completed Falls evaluation completed    MEDICARE RISK AT  HOME: Medicare Risk at Home Any stairs in or around the home?: Yes If so, are there any without handrails?: No Home free of loose throw rugs in walkways, pet beds, electrical cords, etc?: Yes Adequate lighting in your home to reduce risk of falls?: Yes Life alert?: No Use of a cane, walker or w/c?: No Grab bars in the bathroom?: No Shower chair or bench in shower?: Yes Elevated toilet seat or a handicapped toilet?: No  TIMED UP AND GO:  Was the test performed?  No    Cognitive Function:    08/05/2018    9:32 AM  MMSE - Mini Mental State Exam  Orientation to time 5  Orientation to Place 5  Registration 3  Attention/ Calculation 5  Recall 3  Language- name 2 objects 2  Language- repeat 1  Language- follow 3 step command 3  Language- read & follow direction 1  Write a sentence 1  Copy design 1  Total score 30        11/19/2023    8:21 AM 11/14/2022    8:31 AM 09/14/2020    9:44 AM 08/11/2019    9:12 AM 02/14/2017    9:26 AM  6CIT Screen  What Year? 0 points 0 points 0 points 0 points 0 points  What month? 0 points 0 points 0 points 0 points 0 points  What time? 0 points 0 points 0 points 0 points 0 points  Count back from 20 0 points 0 points 0 points 0 points 0 points  Months in reverse 0 points 0 points 0 points 0 points 0 points  Repeat phrase 0 points 0 points 0 points 0 points 4 points  Total Score 0 points 0 points 0 points 0 points 4 points    Immunizations Immunization History  Administered Date(s) Administered   Fluad Quad(high Dose 65+) 08/27/2021   Influenza Split 11/27/2012, 12/16/2012   Influenza, High Dose Seasonal PF 09/17/2018, 09/24/2019, 10/07/2020   Influenza, Seasonal, Injecte, Preservative Fre 12/11/2013   Influenza,inj,Quad PF,6+ Mos 09/01/2014, 08/27/2015, 08/27/2016   Influenza-Unspecified 09/24/2017, 08/27/2021   PFIZER Comirnaty(Gray Top)Covid-19 Tri-Sucrose Vaccine 05/17/2021   PFIZER(Purple Top)SARS-COV-2 Vaccination 03/18/2020,  04/08/2020, 10/12/2020   Pfizer Covid-19 Vaccine Bivalent Booster 34yrs & up 09/20/2023   Pneumococcal Conjugate-13 02/14/2017   Pneumococcal Polysaccharide-23 02/09/2012   Tdap 01/28/2013, 12/05/2021   Zoster Recombinant(Shingrix) 01/07/2021, 08/26/2021   Zoster, Live 09/15/2011    TDAP status: Up to date  Flu Vaccine status: Up to date  Pneumococcal vaccine status: Up to date  Covid-19 vaccine status: Completed vaccines  Qualifies for Shingles Vaccine? Yes   Zostavax completed Yes   Shingrix Completed?: Yes  Screening Tests Health Maintenance  Topic Date Due   COVID-19 Vaccine (6 - 2024-25 season) 11/15/2023   HEMOGLOBIN A1C  02/28/2024   Diabetic kidney evaluation - eGFR measurement  02/29/2024   OPHTHALMOLOGY EXAM  05/22/2024   Diabetic kidney evaluation -  Urine ACR  06/04/2024   FOOT EXAM  06/04/2024   Medicare Annual Wellness (AWV)  11/18/2024   DTaP/Tdap/Td (3 - Td or Tdap) 12/06/2031   Pneumonia Vaccine 16+ Years old  Completed   INFLUENZA VACCINE  Completed   Hepatitis C Screening  Completed   Zoster Vaccines- Shingrix  Completed   HPV VACCINES  Aged Out   Colonoscopy  Discontinued    Health Maintenance  Health Maintenance Due  Topic Date Due   COVID-19 Vaccine (6 - 2024-25 season) 11/15/2023    Colorectal cancer screening: No longer required.   Lung Cancer Screening: (Low Dose CT Chest recommended if Age 16-80 years, 20 pack-year currently smoking OR have quit w/in 15years.) does not qualify.   Lung Cancer Screening Referral: n/a  Additional Screening:  Hepatitis C Screening: does qualify; Completed 08/27/2015  Vision Screening: Recommended annual ophthalmology exams for early detection of glaucoma and other disorders of the eye. Is the patient up to date with their annual eye exam?  Yes  Who is the provider or what is the name of the office in which the patient attends annual eye exams? Dr. Laurine Blazer, [facility] If pt is not established with a  provider, would they like to be referred to a provider to establish care? No .   Dental Screening: Recommended annual dental exams for proper oral hygiene  Diabetic Foot Exam: Diabetic Foot Exam: Completed 06/05/23  Community Resource Referral / Chronic Care Management: CRR required this visit?  No   CCM required this visit?  No     Plan:     I have personally reviewed and noted the following in the patient's chart:   Medical and social history Use of alcohol, tobacco or illicit drugs  Current medications and supplements including opioid prescriptions. Patient is not currently taking opioid prescriptions. Functional ability and status Nutritional status Physical activity Advanced directives List of other physicians Hospitalizations, surgeries, and ER visits in previous 12 months: Diagnosed with Covid in October, was not admitted.  Vitals Screenings to include cognitive, depression, and falls Referrals and appointments  In addition, I have reviewed and discussed with patient certain preventive protocols, quality metrics, and best practice recommendations. A written personalized care plan for preventive services as well as general preventive health recommendations were provided to patient.     Novella Olive, FNP   11/19/2023   After Visit Summary: (MyChart) Due to this being a telephonic visit, the after visit summary with patients personalized plan was offered to patient via MyChart   Follow-up with PCP as scheduled.

## 2023-11-20 ENCOUNTER — Encounter: Payer: Self-pay | Admitting: Family Medicine

## 2023-11-20 ENCOUNTER — Telehealth: Payer: Self-pay | Admitting: Family Medicine

## 2023-11-20 NOTE — Telephone Encounter (Signed)
T dap is up to date. Due 12/06/2031.

## 2023-11-23 ENCOUNTER — Ambulatory Visit: Payer: Self-pay | Admitting: Family Medicine

## 2023-11-23 DIAGNOSIS — R509 Fever, unspecified: Secondary | ICD-10-CM | POA: Diagnosis not present

## 2023-11-23 DIAGNOSIS — J069 Acute upper respiratory infection, unspecified: Secondary | ICD-10-CM | POA: Diagnosis not present

## 2023-11-23 DIAGNOSIS — R051 Acute cough: Secondary | ICD-10-CM | POA: Diagnosis not present

## 2023-11-23 DIAGNOSIS — Z6841 Body Mass Index (BMI) 40.0 and over, adult: Secondary | ICD-10-CM | POA: Diagnosis not present

## 2023-11-23 NOTE — Telephone Encounter (Signed)
  Chief Complaint: Sore Throat, Cough Symptoms: Sore throat, cough, congestion Frequency: Since this morning Pertinent Negatives: Patient denies fever Disposition: [] ED /[x] Urgent Care (no appt availability in office) / [] Appointment(In office/virtual)/ []  [facility]/ [] Home Care/ [] Refused Recommended Disposition /[] [facility]/ []  Follow-up with PCP Additional Notes: Patient calling with c/o sore throat, cough and congestion. Symptoms have been going on since this morning. No appointments available in office. Recommendation is urgent care. Patient verbalizes understanding of plan and all questions answered.   Copied from CRM [id number]. Topic: Clinical - Medical Advice >> Nov 23, 2023  8:41 AM Geroge Baseman wrote: Patient is having a extremely sore throat/congestion, and would like to be seen today if possible, video appointments are fine with him. Would like to speak to a nurse to know if he should be seen in office or urgent care. Reason for Disposition  SEVERE (e.g., excruciating) throat pain  Answer Assessment - Initial Assessment Questions 1. ONSET: "When did the throat start hurting?" (Hours or days ago)      This morning 2. SEVERITY: "How bad is the sore throat?" (Scale 1-10; mild, moderate or severe)   - MILD (1-3):  Doesn't interfere with eating or normal activities.   - MODERATE (4-7): Interferes with eating some solids and normal activities.   - SEVERE (8-10):  Excruciating pain, interferes with most normal activities.   - SEVERE WITH DYSPHAGIA (10): Can't swallow liquids, drooling.     Moderate 3. STREP EXPOSURE: "Has there been any exposure to strep within the past week?" If Yes, ask: "What type of contact occurred?"      No 4.  VIRAL SYMPTOMS: "Are there any symptoms of a cold, such as a runny nose, cough, hoarse voice or red eyes?"      cough 5. FEVER: "Do you have a fever?" If Yes, ask: "What is your temperature, how was it measured, and when did it  start?"     No 6. PUS ON THE TONSILS: "Is there pus on the tonsils in the back of your throat?"     No 7. OTHER SYMPTOMS: "Do you have any other symptoms?" (e.g., difficulty breathing, headache, rash)     No  Protocols used: Sore Throat-A-AH

## 2023-11-27 ENCOUNTER — Telehealth: Payer: Medicare Other | Admitting: Physician Assistant

## 2023-11-27 ENCOUNTER — Ambulatory Visit: Payer: Self-pay | Admitting: Family Medicine

## 2023-11-27 DIAGNOSIS — J40 Bronchitis, not specified as acute or chronic: Secondary | ICD-10-CM | POA: Diagnosis not present

## 2023-11-27 MED ORDER — AZITHROMYCIN 250 MG PO TABS: ORAL_TABLET | ORAL | 0 refills | Status: AC

## 2023-11-27 NOTE — Progress Notes (Signed)
 Virtual Visit Consent   Tracy Bennett, you are scheduled for a virtual visit with a [facility] provider today. Just as with appointments in the office, your consent must be obtained to participate. Your consent will be active for this visit and any virtual visit you may have with one of our providers in the next 365 days. If you have a MyChart account, a copy of this consent can be sent to you electronically.  As this is a virtual visit, video technology does not allow for your provider to perform a traditional examination. This may limit your provider's ability to fully assess your condition. If your provider identifies any concerns that need to be evaluated in person or the need to arrange testing (such as labs, EKG, etc.), we will make arrangements to do so. Although advances in technology are sophisticated, we cannot ensure that it will always work on either your end or our end. If the connection with a video visit is poor, the visit may have to be switched to a telephone visit. With either a video or telephone visit, we are not always able to ensure that we have a secure connection.  By engaging in this virtual visit, you consent to the provision of healthcare and authorize for your insurance to be billed (if applicable) for the services provided during this visit. Depending on your insurance coverage, you may receive a charge related to this service.  I need to obtain your verbal consent now. Are you willing to proceed with your visit today? Tracy Bennett has provided verbal consent on 11/27/2023 for a virtual visit (video or telephone). Harlene PEDLAR Ward, PA-C  Date: 11/27/2023 5:07 PM  Virtual Visit via Video Note   I, Harlene PEDLAR Ward, connected with  Tracy Bennett  ([id number], 04-20-1946) on 11/27/23 at  5:00 PM EST by a video-enabled telemedicine application and verified that I am speaking with the correct person using two identifiers.  Location: Patient: Virtual Visit Location Patient:  Home Provider: Virtual Visit Location Provider: Home Office   I discussed the limitations of evaluation and management by telemedicine and the availability of in person appointments. The patient expressed understanding and agreed to proceed.    History of Present Illness: Tracy Bennett is a 77 y.o. who identifies as a male who was assigned male at birth, and is being seen today for congestion, productive cough that started about 5-6 days ago. [facility], prescribed an inhaler and cough medicine.  He reports minimal improvement with these medications. He denies fever or chills, shortness or breath or wheezing. He reports taking hycodan in the past with relief, requesting this.   HPI: HPI  Problems:  Patient Active Problem List   Diagnosis Date Noted   COVID-19 07/20/2023   Cervical radiculitis 10/23/2022   Elevated liver enzymes 10/06/2021   Palpitations-RVOT 03/24/2021   (HFpEF) heart failure with preserved ejection fraction (HCC) 03/24/2021   Sinus bradycardia 03/24/2021   S/P YAG capsulotomy 09/18/2018   Bilateral dry eyes 08/14/2018   Aortic atherosclerosis (HCC) 05/20/2018   Brow ptosis 10/02/2016   Dermatochalasis of both upper eyelids 10/02/2016   CKD (chronic kidney disease) stage 3, GFR 30-59 ml/min (HCC) 03/29/2016   Obesity, Class III, BMI 40-49.9 (morbid obesity) (HCC) 08/27/2015   OAB (overactive bladder) 08/27/2015   Mild diastolic dysfunction 11/23/2014   LVH (left ventricular hypertrophy) due to hypertensive disease 11/23/2014   Cataract extraction status, unspecified eye 10/20/2014   OSA treated with BiPAP 09/01/2014  Controlled diabetes mellitus type 2 with complications (HCC) 09/01/2014   Severe obesity (BMI >= 40) (HCC) 01/29/2014   Obesity 02/09/2012   Hypertension 10/12/2011   GERD (gastroesophageal reflux disease) 10/12/2011   Hyperlipidemia 10/12/2011    Allergies:  Allergies  Allergen Reactions   Amoxicillin Swelling     Tongue sores   Iodinated Contrast Media Shortness Of Breath and Rash    Patient gets hot and sweating   Lisinopril Cough and Rash    Other reaction(s): Cough   Medications:  Current Outpatient Medications:    Accu-Chek FastClix Lancets MISC, Dx DM E11.9 Check fasting blood sugar daily and after largest meal of the day a couple time a week., Disp: 102 each, Rfl: 11   ACCU-CHEK GUIDE test strip, CHECK FASTING BLOOD SUGAR EVERY MORNING. DX: E11.9, Disp: 100 strip, Rfl: 11   albuterol  (VENTOLIN  HFA) 108 (90 Base) MCG/ACT inhaler, Inhale 2 puffs into the lungs every 6 (six) hours as needed for wheezing or shortness of breath. (Patient not taking: Reported on 11/19/2023), Disp: 8 g, Rfl: 0   doxycycline  (VIBRA -TABS) 100 MG tablet, Take 1 tablet (100 mg total) by mouth 2 (two) times daily. (Patient not taking: Reported on 11/19/2023), Disp: 10 tablet, Rfl: 0   finasteride  (PROSCAR ) 5 MG tablet, Take 1 tablet (5 mg total) by mouth daily., Disp: 30 tablet, Rfl: 1   furosemide  (LASIX ) 40 MG tablet, TAKE 1 TABLET BY MOUTH EVERY OTHER DAY, Disp: 15 tablet, Rfl: 0   loratadine (CLARITIN) 10 MG tablet, Take 10 mg by mouth daily as needed for allergies. , Disp: , Rfl:    Misc. Devices MISC, New bipap machine at current settings., Disp: , Rfl:    MOUNJARO  10 MG/0.5ML Pen, ADMINISTER 10 MG UNDER THE SKIN 1 TIME A WEEK, Disp: 2 mL, Rfl: 2   mupirocin  ointment (BACTROBAN ) 2 %, Apply topically 2 (two) times daily. X 5 days (Patient not taking: Reported on 11/19/2023), Disp: 30 g, Rfl: 0   oxybutynin (DITROPAN-XL) 10 MG 24 hr tablet, Take 10 mg by mouth daily., Disp: , Rfl: 3   pantoprazole  (PROTONIX ) 40 MG tablet, TAKE 1 TABLET BY MOUTH EVERY DAY, Disp: 90 tablet, Rfl: 3   simvastatin  (ZOCOR ) 40 MG tablet, TAKE 1 TABLET BY MOUTH DAILY AT 6 PM., Disp: 90 tablet, Rfl: 3   tamsulosin (FLOMAX) 0.4 MG CAPS capsule, Take 0.4 mg by mouth daily. , Disp: , Rfl:    tirzepatide  (MOUNJARO ) 12.5 MG/0.5ML Pen, Inject 12.5 mg  into the skin once a week., Disp: 6 mL, Rfl: 1   valsartan -hydrochlorothiazide  (DIOVAN -HCT) 80-12.5 MG tablet, TAKE 1 TABLET BY MOUTH DAILY, Disp: 90 tablet, Rfl: 0  Observations/Objective: Patient is well-developed, well-nourished in no acute distress.  Resting comfortably at home.  Head is normocephalic, atraumatic.  No labored breathing.  Speech is clear and coherent with logical content.  Patient is alert and oriented at baseline.    Assessment and Plan: 1. Bronchitis (Primary)  Will send in azithromycin . Advised that we cannot send hycodan through televisit.   Follow Up Instructions: I discussed the assessment and treatment plan with the patient. The patient was provided an opportunity to ask questions and all were answered. The patient agreed with the plan and demonstrated an understanding of the instructions.  A copy of instructions were sent to the patient via MyChart unless otherwise noted below.     The patient was advised to call back or seek an in-person evaluation if the symptoms worsen or if the  condition fails to improve as anticipated.    Harlene PEDLAR Ward, PA-C

## 2023-11-27 NOTE — Telephone Encounter (Addendum)
 Copied from CRM [id number]. Topic: Clinical - Pink Word Triage >> Nov 27, 2023  8:59 AM Tracy Bennett wrote: Reason for Triage: Patient seeking hydrocodone  prescription for cough and congestion   Chief Complaint: Cough Symptoms: Productive cough Frequency: Since Friday Pertinent Negatives: Patient denies relief  Disposition: [] ED /[] Urgent Care (no appt availability in office) / [] Appointment(In office/virtual)/ [x]  [facility]/ [] Home Care/ [] Refused Recommended Disposition /[] [facility]/ []  Follow-up with PCP Additional Notes: Patient called in complaining of a productive cough that has been present since the weekend. He was seen in a [facility] on 12/27, prescribed medication, and has not felt relief. According to the patient, the provider instructed him to make an appointment if he did not experience relief by 1/1. Patient is seeking a stronger medication to soothe the cough. Patient states he is coughing up brown mucous and denies fever, chest pain, and SOB. Patient also stated that he has had leg cramps at night, but gets relief from drinking water and walking around. Patient uses a BiPAP to sleep at night and stated that sometimes he has to sit up in the middle of the night to cough. Patient stated that once he starts coughing it can be hard to stop at times. Advised patient to be seen within 24 hours. Patient requested an office visit, but no availability. Scheduled patient a virtual UC visit for today at 5pm.    Reason for Disposition  SEVERE coughing spells (e.g., whooping sound after coughing, vomiting after coughing)  Answer Assessment - Initial Assessment Questions 1. ONSET: When did the cough begin?      Friday  2. SEVERITY: How bad is the cough today?      Not bad at the moment, but just took cough medicine   3. SPUTUM: Describe the color of your sputum (none, dry cough; clear, white, yellow, green)     Brown mucous  4. HEMOPTYSIS: Are you coughing  up any blood? If so ask: How much? (flecks, streaks, tablespoons, etc.)     Denies  5. DIFFICULTY BREATHING: Are you having difficulty breathing? If Yes, ask: How bad is it? (e.g., mild, moderate, severe)    - MILD: No SOB at rest, mild SOB with walking, speaks normally in sentences, can lie down, no retractions, pulse < 100.    - MODERATE: SOB at rest, SOB with minimal exertion and prefers to sit, cannot lie down flat, speaks in phrases, mild retractions, audible wheezing, pulse 100-120.    - SEVERE: Very SOB at rest, speaks in single words, struggling to breathe, sitting hunched forward, retractions, pulse > 120      Denies, but uses a BiPAP at night and sometimes has to sit up to cough  6. FEVER: Do you have a fever? If Yes, ask: What is your temperature, how was it measured, and when did it start?     Denies fever  8. LUNG HISTORY: Do you have any history of lung disease?  (e.g., pulmonary embolus, asthma, emphysema)     Uses BiPAP machine at night  10. OTHER SYMPTOMS: Do you have any other symptoms? (e.g., runny nose, wheezing, chest pain)       Denies chest pain, feels congestion in nose and throat  Protocols used: Cough - Acute Productive-A-AH

## 2023-11-27 NOTE — Patient Instructions (Signed)
 Tracy Bennett, thank you for joining Harlene PEDLAR Ward, PA-C for today's virtual visit.  While this provider is not your primary care provider (PCP), if your PCP is located in our provider database this encounter information will be shared with them immediately following your visit.   A [facility] MyChart account gives you access to today's visit and all your visits, tests, and labs performed at [facility]  click here if you don't have a [facility] MyChart account or go to [email or web address]  Consent: (Patient) Tracy Bennett provided verbal consent for this virtual visit at the beginning of the encounter.  Current Medications:  Current Outpatient Medications:    azithromycin  (ZITHROMAX ) 250 MG tablet, Take 2 tablets on day 1, then 1 tablet daily on days 2 through 5, Disp: 6 tablet, Rfl: 0   Accu-Chek FastClix Lancets MISC, Dx DM E11.9 Check fasting blood sugar daily and after largest meal of the day a couple time a week., Disp: 102 each, Rfl: 11   ACCU-CHEK GUIDE test strip, CHECK FASTING BLOOD SUGAR EVERY MORNING. DX: E11.9, Disp: 100 strip, Rfl: 11   albuterol  (VENTOLIN  HFA) 108 (90 Base) MCG/ACT inhaler, Inhale 2 puffs into the lungs every 6 (six) hours as needed for wheezing or shortness of breath. (Patient not taking: Reported on 11/19/2023), Disp: 8 g, Rfl: 0   doxycycline  (VIBRA -TABS) 100 MG tablet, Take 1 tablet (100 mg total) by mouth 2 (two) times daily. (Patient not taking: Reported on 11/19/2023), Disp: 10 tablet, Rfl: 0   finasteride  (PROSCAR ) 5 MG tablet, Take 1 tablet (5 mg total) by mouth daily., Disp: 30 tablet, Rfl: 1   furosemide  (LASIX ) 40 MG tablet, TAKE 1 TABLET BY MOUTH EVERY OTHER DAY, Disp: 15 tablet, Rfl: 0   loratadine (CLARITIN) 10 MG tablet, Take 10 mg by mouth daily as needed for allergies. , Disp: , Rfl:    Misc. Devices MISC, New bipap machine at current settings., Disp: , Rfl:    MOUNJARO  10 MG/0.5ML Pen, ADMINISTER 10 MG UNDER THE SKIN 1  TIME A WEEK, Disp: 2 mL, Rfl: 2   mupirocin  ointment (BACTROBAN ) 2 %, Apply topically 2 (two) times daily. X 5 days (Patient not taking: Reported on 11/19/2023), Disp: 30 g, Rfl: 0   oxybutynin (DITROPAN-XL) 10 MG 24 hr tablet, Take 10 mg by mouth daily., Disp: , Rfl: 3   pantoprazole  (PROTONIX ) 40 MG tablet, TAKE 1 TABLET BY MOUTH EVERY DAY, Disp: 90 tablet, Rfl: 3   simvastatin  (ZOCOR ) 40 MG tablet, TAKE 1 TABLET BY MOUTH DAILY AT 6 PM., Disp: 90 tablet, Rfl: 3   tamsulosin (FLOMAX) 0.4 MG CAPS capsule, Take 0.4 mg by mouth daily. , Disp: , Rfl:    tirzepatide  (MOUNJARO ) 12.5 MG/0.5ML Pen, Inject 12.5 mg into the skin once a week., Disp: 6 mL, Rfl: 1   valsartan -hydrochlorothiazide  (DIOVAN -HCT) 80-12.5 MG tablet, TAKE 1 TABLET BY MOUTH DAILY, Disp: 90 tablet, Rfl: 0   Medications ordered in this encounter:  Meds ordered this encounter  Medications   azithromycin  (ZITHROMAX ) 250 MG tablet    Sig: Take 2 tablets on day 1, then 1 tablet daily on days 2 through 5    Dispense:  6 tablet    Refill:  0    Supervising Provider:   LAMPTEY, PHILIP O [[id number]]     *If you need refills on other medications prior to your next appointment, please contact your pharmacy*  Follow-Up: Call back or seek an in-person evaluation if the symptoms worsen or  if the condition fails to improve as anticipated.  [facility] [phone]  Other Instructions Can take antibiotic as prescribed.  Recommend continue with cough syrup prescribed with [facility] clinic.  Recommend Mucinex  and Flonase  for congestion and postnasal drip. Drink plenty of fluids.  If no improvement or symptoms become worse recommend in person evaluation.    If you have been instructed to have an in-person evaluation today at a local Urgent Care facility, please use the link below. It will take you to a list of all of our available [facility], including address, phone number and hours of operation. Please do not delay  care.  [facility]  If you or a family member do not have a primary care provider, use the link below to schedule a visit and establish care. When you choose a [facility] primary care physician or advanced practice provider, you gain a long-term partner in health. Find a Primary Care Provider  Learn more about [facility]'s in-office and virtual care options: [facility] - Get Care Now

## 2023-12-01 ENCOUNTER — Other Ambulatory Visit: Payer: Self-pay | Admitting: Family Medicine

## 2023-12-03 ENCOUNTER — Ambulatory Visit (INDEPENDENT_AMBULATORY_CARE_PROVIDER_SITE_OTHER): Payer: Medicare Other | Admitting: Family Medicine

## 2023-12-03 ENCOUNTER — Encounter: Payer: Self-pay | Admitting: Family Medicine

## 2023-12-03 VITALS — BP 138/66 | HR 78 | Ht 70.0 in | Wt 281.0 lb

## 2023-12-03 DIAGNOSIS — I10 Essential (primary) hypertension: Secondary | ICD-10-CM | POA: Diagnosis not present

## 2023-12-03 DIAGNOSIS — I5030 Unspecified diastolic (congestive) heart failure: Secondary | ICD-10-CM

## 2023-12-03 DIAGNOSIS — J40 Bronchitis, not specified as acute or chronic: Secondary | ICD-10-CM | POA: Diagnosis not present

## 2023-12-03 DIAGNOSIS — E118 Type 2 diabetes mellitus with unspecified complications: Secondary | ICD-10-CM | POA: Diagnosis not present

## 2023-12-03 LAB — POCT GLYCOSYLATED HEMOGLOBIN (HGB A1C): Hemoglobin A1C: 5.6 % (ref 4.0–5.6)

## 2023-12-03 MED ORDER — HYDROCODONE BIT-HOMATROP MBR 5-1.5 MG/5ML PO SOLN: 5.0000 mL | Freq: Three times a day (TID) | ORAL | 0 refills | Status: DC | PRN

## 2023-12-03 NOTE — Progress Notes (Signed)
 Established Patient Office Visit  Subjective  Patient ID: Tracy Bennett, male    DOB: [birth year]  Age: 78 y.o. MRN: [id number]  Chief Complaint  Patient presents with   Diabetes   Cough    HPI Diabetes - no hypoglycemic events. No wounds or sores that are not healing well. No increased thirst or urination. Checking glucose at home. Taking medications as prescribed without any side effects.  Cough  -he did a video visit on December 31 about a week ago for congestion and productive cough that had started 5 to 6 days prior.  [facility] and was prescribed an inhaler and some cough medicine.  They treated him for bronchitis and sent in azithromycin .  Is also wanting a refill on Hycodan but they could not send via televisit.  Hypertension- Pt denies chest pain, SOB, dizziness, or heart palpitations.  Taking meds as directed w/o problems.  Denies medication side effects.    He still feeling occasionally a little lightheaded.  We had recently decreased his valsartan  and has felt better in general but noticed when he was putting up Christmas boxes that the bending over and standing back up he would get lightheaded.  He said he did check his blood pressure in particular that day at home and his systolic pressure was 106 but his diastolic was 97.    ROS    Objective:     BP 138/66   Pulse 78   Ht 5' 10 (1.778 m)   Wt 281 lb (127.5 kg)   SpO2 96%   BMI 40.32 kg/m     Orthostatic Vitals for the past 48 hrs (Last 6 readings):  BP Pulse Patient Position (if appropriate) BP- Standing at 0 minutes Pulse- Standing at 0 minutes BP- Sitting Pulse- Sitting BP- Lying Pulse- Lying  12/03/23 0942 138/66 78 -- -- -- -- -- -- --  12/03/23 1023 -- -- Orthostatic Vitals 134/84 71 119/64 101 118/78 76     Physical Exam Vitals and nursing note reviewed.  Constitutional:      Appearance: Normal appearance.  HENT:     Head: Normocephalic and atraumatic.  Eyes:      Conjunctiva/sclera: Conjunctivae normal.  Cardiovascular:     Rate and Rhythm: Normal rate and regular rhythm.  Pulmonary:     Effort: Pulmonary effort is normal.     Breath sounds: Normal breath sounds.  Skin:    General: Skin is warm and dry.  Neurological:     Mental Status: He is alert.  Psychiatric:        Mood and Affect: Mood normal.      Results for orders placed or performed in visit on 12/03/23  POCT HgB A1C  Result Value Ref Range   Hemoglobin A1C 5.6 4.0 - 5.6 %   HbA1c POC (<> result, manual entry)     HbA1c, POC (prediabetic range)     HbA1c, POC (controlled diabetic range)        The ASCVD Risk score (Arnett DK, et al., 2019) failed to calculate for the following reasons:   The valid total cholesterol range is 130 to 320 mg/dL    Assessment & Plan:   Problem List Items Addressed This Visit       Cardiovascular and Mediastinum   Hypertension   To recheck blood pressure before he leaves today.  If it still little elevated I may have him take Lasix  for a couple of days.  It sounds like his baseline weight is little closer to 277 and he is 280 today.  Blood pressures looked good normal with orthostatics.  It would did not drop nearly as low as it did previously.  We discussed continuing to work on low-salt diet.  Still on the Farxiga  every other day.      (HFpEF) heart failure with preserved ejection fraction (HCC)   Currently just uses his furosemide  as needed he is not taking it daily.  He is planning on getting back in with Dr. Fernande he called to make an appointment and he did not have anything until March they had offered an appointment with his PA but he really would prefer to get back in with him.        Endocrine   Controlled diabetes mellitus type 2 with complications (HCC) - Primary   He still has some Farxiga  left over as he had just filled it before the end of the year so he is actually taking 5 mg every other day.      Relevant  Medications   dapagliflozin  propanediol (FARXIGA ) 5 MG TABS tablet   Other Relevant Orders   POCT HgB A1C (Completed)     Other   Severe obesity (BMI >= 40) (HCC)   He has really done an amazing job with weight loss since starting the Mounjaro  for his diabetes.      Relevant Medications   dapagliflozin  propanediol (FARXIGA ) 5 MG TABS tablet   Other Visit Diagnoses       Bronchitis          Bronchitis-he is feeling some better after the azithromycin  but would like to have a prescription for the cough syrup he says that usually helps the most.  Return in about 4 months (around 04/01/2024) for Diabetes follow-up.    Dorothyann Byars, MD

## 2023-12-03 NOTE — Assessment & Plan Note (Signed)
 He has really done an amazing job with weight loss since starting the [facility] for his diabetes.

## 2023-12-03 NOTE — Assessment & Plan Note (Addendum)
 Currently just uses his furosemide  as needed he is not taking it daily.  He is planning on getting back in with Dr. Fernande he called to make an appointment and he did not have anything until March they had offered an appointment with his PA but he really would prefer to get back in with him.

## 2023-12-03 NOTE — Patient Instructions (Signed)
 Okay to continue the [place] every other day until you run out.  For now we will stay with the Mounjaro 12.5 mg.  Let us know if you end up needing a new authorization for this calendar year when you are due for your next refill in March.

## 2023-12-03 NOTE — Assessment & Plan Note (Signed)
 He still has some Farxiga left over as he had just filled it before the end of the year so he is actually taking 5 mg every other day.

## 2023-12-03 NOTE — Assessment & Plan Note (Addendum)
 To recheck blood pressure before he leaves today.  If it still little elevated I may have him take Lasix  for a couple of days.  It sounds like his baseline weight is little closer to 277 and he is 280 today.  Blood pressures looked good normal with orthostatics.  It would did not drop nearly as low as it did previously.  We discussed continuing to work on low-salt diet.  Still on the Farxiga  every other day.

## 2023-12-05 ENCOUNTER — Telehealth: Payer: Self-pay

## 2023-12-05 NOTE — Telephone Encounter (Signed)
 Copied from CRM [id number]. Topic: Clinical - Medication Refill >> Dec 03, 2023 11:17 AM Joesph PARAS wrote: Most Recent Primary Care Visit:  Provider: METHENEY, CATHERINE D  Department: [facility]  Visit Type: OFFICE VISIT  Date: 12/03/2023  Medication: valsartan -hydrochlorothiazide  (DIOVAN -HCT) 80-12.5 MG tablet  Has the patient contacted their pharmacy? Yes - States needs refill and approval  Is this the correct pharmacy for this prescription? Yes If no, delete pharmacy and type the correct one.  This is the patient's preferred pharmacy:   [id number] - [place], [place] - [address] AT [facility] OF [address]  [place] [address] Phone: [phone] Fax: [phone]   Has the prescription been filled recently? Yes  Is the patient out of the medication? Yes  Has the patient been seen for an appointment in the last year OR does the patient have an upcoming appointment? Yes  Can we respond through MyChart? Yes  Agent: Please be advised that Rx refills may take up to 3 business days. We ask that you follow-up with your pharmacy.

## 2023-12-05 NOTE — Telephone Encounter (Signed)
 Refill sent to pharmacy.

## 2023-12-19 ENCOUNTER — Other Ambulatory Visit: Payer: Self-pay | Admitting: Family Medicine

## 2023-12-25 DIAGNOSIS — L570 Actinic keratosis: Secondary | ICD-10-CM | POA: Diagnosis not present

## 2023-12-25 DIAGNOSIS — D0462 Carcinoma in situ of skin of left upper limb, including shoulder: Secondary | ICD-10-CM | POA: Diagnosis not present

## 2023-12-27 ENCOUNTER — Ambulatory Visit (INDEPENDENT_AMBULATORY_CARE_PROVIDER_SITE_OTHER): Payer: Medicare Other | Admitting: Family Medicine

## 2023-12-27 ENCOUNTER — Encounter: Payer: Self-pay | Admitting: Family Medicine

## 2023-12-27 VITALS — BP 132/70 | HR 103 | Ht 70.0 in | Wt 285.0 lb

## 2023-12-27 DIAGNOSIS — M545 Low back pain, unspecified: Secondary | ICD-10-CM

## 2023-12-27 MED ORDER — PREDNISONE 50 MG PO TABS: 50.0000 mg | ORAL_TABLET | Freq: Every day | ORAL | 0 refills | Status: DC

## 2023-12-27 NOTE — Progress Notes (Signed)
   Acute Office Visit  Subjective:     Patient ID: Tracy Bennett, male    DOB: 07/13/1946, 78 y.o.   MRN: [id number]  Chief Complaint  Patient presents with   Back Pain    HPI Patient is in today for right lower back pain that was triggered after doing yard work a couple of weeks ago.  He says most of the time when he gets a flare with his back is usually after he has been doing something where he has been bending forward and flexing and then goes to stand back up and then he gets sudden pain and tightness.  He says he is been dealing with it on and off for the last couple weeks while he is trying to continue to do some yard work.  He has been using Biofreeze, ice and taking 7.5 mg meloxicam and just not getting relief.  He did let me know that his cough and cold is much better.  ROS      Objective:    BP 132/70   Pulse (!) 103   Ht 5\' 10"  (1.778 m)   Wt 285 lb (129.3 kg)   SpO2 95%   BMI 40.89 kg/m    Physical Exam Vitals reviewed.  Constitutional:      Appearance: Normal appearance.  HENT:     Head: Normocephalic.  Pulmonary:     Effort: Pulmonary effort is normal.  Musculoskeletal:     Comments: Nontender over the lumbar spine.  He points to that right low back area just above the hip and the belt line.  He is not tender directly over the area but feels like the pain is a little bit deeper  Neurological:     Mental Status: He is alert and oriented to person, place, and time.  Psychiatric:        Mood and Affect: Mood normal.        Behavior: Behavior normal.     No results found for any visits on 12/27/23.      Assessment & Plan:   Problem List Items Addressed This Visit   None Visit Diagnoses       Acute right-sided low back pain without sciatica    -  Primary   Relevant Medications   predniSONE (DELTASONE) 50 MG tablet       In the past he says he is responded really well to a prednisone burst.  Prescription sent to pharmacy.  Given stretches  via handout to do on his own at home.  Reminded him not to take an NSAID with the prednisone but can overlap with Tylenol.  Continue Biofreeze heat or ice whichever feels better.  If not improving over the next 2 weeks then please let us know.  Meds ordered this encounter  Medications   predniSONE (DELTASONE) 50 MG tablet    Sig: Take 1 tablet (50 mg total) by mouth daily.    Dispense:  5 tablet    Refill:  0    Return if symptoms worsen or fail to improve.  Nani Gasser, MD

## 2023-12-27 NOTE — Progress Notes (Signed)
Pt reports that this started about 4 weeks ago while he was doing some yard work.   The pain is on his lower R side. He stated it feels like a pinched nerve. He has been using tylenol, Ibuprofen, and mobic.

## 2023-12-31 ENCOUNTER — Ambulatory Visit (INDEPENDENT_AMBULATORY_CARE_PROVIDER_SITE_OTHER): Payer: Medicare Other

## 2023-12-31 ENCOUNTER — Ambulatory Visit: Payer: Self-pay | Admitting: Family Medicine

## 2023-12-31 ENCOUNTER — Telehealth: Payer: Self-pay

## 2023-12-31 ENCOUNTER — Ambulatory Visit
Admission: RE | Admit: 2023-12-31 | Discharge: 2023-12-31 | Disposition: A | Discharge status: Home / Self Care | Payer: Medicare Other | Source: Ambulatory Visit | Attending: Family Medicine | Admitting: Family Medicine

## 2023-12-31 VITALS — BP 135/77 | HR 95 | Temp 97.5°F | Resp 16

## 2023-12-31 DIAGNOSIS — M47816 Spondylosis without myelopathy or radiculopathy, lumbar region: Secondary | ICD-10-CM | POA: Diagnosis not present

## 2023-12-31 DIAGNOSIS — M4317 Spondylolisthesis, lumbosacral region: Secondary | ICD-10-CM | POA: Diagnosis not present

## 2023-12-31 DIAGNOSIS — M48061 Spinal stenosis, lumbar region without neurogenic claudication: Secondary | ICD-10-CM | POA: Diagnosis not present

## 2023-12-31 DIAGNOSIS — M545 Low back pain, unspecified: Secondary | ICD-10-CM

## 2023-12-31 DIAGNOSIS — M25551 Pain in right hip: Secondary | ICD-10-CM | POA: Diagnosis not present

## 2023-12-31 MED ORDER — HYDROCODONE-ACETAMINOPHEN 7.5-325 MG PO TABS: 1.0000 | ORAL_TABLET | Freq: Four times a day (QID) | ORAL | 0 refills | Status: DC | PRN

## 2023-12-31 NOTE — Telephone Encounter (Signed)
Chief Complaint: R hip pain Symptoms: R hip pain Frequency: approx 4 wks ago after doing yard work Pertinent Negatives: Patient denies numbness, tingling, fever Disposition: [] ED /[x] Urgent Care (no appt availability in office) / [] Appointment(In office/virtual)/ []  [facility]/ [] Home Care/ [] Refused Recommended Disposition /[] [facility]/ []  Follow-up with PCP Additional Notes: Pt seen on 1/30 for R hip pain after working in the yard. PCP prescribed prednisone. Pt states he has one more prednisone tablet to take and does not feel better. Pt states the pain comes and goes but is a 10/10 when it's the worst. Pt has hx of kidney stones and states the pain feels somewhat similar to that, but pt denies hematuria, burning with urination, frequency, urgency. Pt states lying flat makes the pain in the R hip worse. Pt states walking improves the pain. Pt denies numbness/tingling. Per protocol, pt advised to be seen in 4 hrs. No availability at PCP office. Pt scheduled to be seen today at noon at [facility]. Pt agreeable. RN advised pt to call back for any worsening before then. Pt verbalized understanding. Pt states he has a ride to the UC.   Copied from CRM [id number]. Topic: Clinical - Red Word Triage >> Dec 31, 2023 10:15 AM Alvino Blood C wrote: Red Word that prompted transfer to Nurse Triage: Patient was seen by the provider on 1/30 and was prescribed predniSONE (DELTASONE) 50 MG tablet. Patient was advised to call the office back if his pain hasn't improved. Patient states he is still having pain in his back and a lower side area into his buttock. Reason for Disposition  [1] SEVERE pain (e.g., excruciating, unable to do any normal activities) AND [2] not improved after 2 hours of pain medicine  Answer Assessment - Initial Assessment Questions 1. LOCATION and RADIATION: "Where is the pain located?"      R side hip/buttocks 2. QUALITY: "What does the pain feel like?"  (e.g., sharp, dull, aching,  burning)     Sharp 3. SEVERITY: "How bad is the pain?" "What does it keep you from doing?"   (Scale 1-10; or mild, moderate, severe)   -  MILD (1-3): doesn't interfere with normal activities    -  MODERATE (4-7): interferes with normal activities (e.g., work or school) or awakens from sleep, limping    -  SEVERE (8-10): excruciating pain, unable to do any normal activities, unable to walk     10/10 at its worst. Pt states getting up and walking helps the pain. 4. ONSET: "When did the pain start?" "Does it come and go, or is it there all the time?"     Approx 4 wks ago after working in the yard. 5. WORK OR EXERCISE: "Has there been any recent work or exercise that involved this part of the body?"      Yard work 4 wks ago. 6. CAUSE: "What do you think is causing the hip pain?"      Sciatic nerve, pinched nerve 7. AGGRAVATING FACTORS: "What makes the hip pain worse?" (e.g., walking, climbing stairs, running)     Lying flat makes it worse, pt sleeping upright in a recliner. When he bends over, it's hard straightening out.  8. OTHER SYMPTOMS: "Do you have any other symptoms?" (e.g., back pain, pain shooting down leg,  fever, rash)     Seen 1/30 in the office, prescribed prednisone. Pain started 4 wks ago after doing yard work. Hx of kidney stones (8-10) - no hematuria, no pain with  urination. Pain level is "just as bad." Couldn't get off the bed to go to the bathroom, sharp pain would "grab me." No burning with urination. Got one more prednisone to take.  Protocols used: Hip Pain-A-AH

## 2023-12-31 NOTE — ED Provider Notes (Signed)
[facility]    CSN: [id number] Arrival date & time: 12/31/23  1200      History   Chief Complaint Chief Complaint  Patient presents with   Back Pain    R hip pain - Entered by patient    HPI Tracy Bennett is a 78 y.o. male.   HPI pleasant 78 year old male presents with right sided back/hip pain for 3 weeks.  Reports that his PCP prescribed prednisone with no improvement.  Patient having difficulty sleeping due to inability to lay down.  PMH significant for severe obesity, HTN, and CKD.  Past Medical History:  Diagnosis Date   Allergy    Arthritis    Community acquired pneumonia of left lower lobe of lung 05/15/2018   Dyspnea    with exertion   Dysrhythmia    frequent PVC's   Frequent urination    GERD (gastroesophageal reflux disease)    History of kidney stones    Hyperlipidemia    Hypertension    Kidney stones    x 4    Sleep apnea    BiPAP    Patient Active Problem List   Diagnosis Date Noted   COVID-19 07/20/2023   Cervical radiculitis 10/23/2022   Elevated liver enzymes 10/06/2021   Palpitations-RVOT 03/24/2021   (HFpEF) heart failure with preserved ejection fraction (HCC) 03/24/2021   Sinus bradycardia 03/24/2021   S/P YAG capsulotomy 09/18/2018   Bilateral dry eyes 08/14/2018   Aortic atherosclerosis (HCC) 05/20/2018   Brow ptosis 10/02/2016   Dermatochalasis of both upper eyelids 10/02/2016   CKD (chronic kidney disease) stage 3, GFR 30-59 ml/min (HCC) 03/29/2016   Obesity, Class III, BMI 40-49.9 (morbid obesity) (HCC) 08/27/2015   OAB (overactive bladder) 08/27/2015   Mild diastolic dysfunction 11/23/2014   LVH (left ventricular hypertrophy) due to hypertensive disease 11/23/2014   Cataract extraction status, unspecified eye 10/20/2014   OSA treated with BiPAP 09/01/2014   Controlled diabetes mellitus type 2 with complications (HCC) 09/01/2014   Severe obesity (BMI >= 40) (HCC) 01/29/2014   Obesity 02/09/2012   Hypertension  10/12/2011   GERD (gastroesophageal reflux disease) 10/12/2011   Hyperlipidemia 10/12/2011    Past Surgical History:  Procedure Laterality Date   CARPAL TUNNEL RELEASE     Right    CERVICAL FUSION     cervical fusion times 2   EYE SURGERY     FOOT TENDON SURGERY  11/14/2011   Dr. Lajoyce Corners   FRACTURE SURGERY     HERNIA REPAIR     left knee surgery     ACL repair    LIPOMA EXCISION Bilateral    on back   MASS EXCISION N/A 01/24/2018   Procedure: EXCISION OF BACK MASS;  Surgeon: Harriette Bouillon, MD;  Location: [facility] OR;  Service: General;  Laterality: N/A;   SPINE SURGERY     UMBILICAL HERNIA REPAIR         Home Medications    Prior to Admission medications   Medication Sig Start Date End Date Taking? Authorizing Provider  HYDROcodone-acetaminophen (NORCO) 7.5-325 MG tablet Take 1 tablet by mouth every 6 (six) hours as needed for up to 7 days for moderate pain (pain score 4-6). 12/31/23 01/07/24 Yes Trevor Iha, FNP  Accu-Chek FastClix Lancets MISC Dx DM E11.9 Check fasting blood sugar daily and after largest meal of the day a couple time a week. 01/05/23   Agapito Games, MD  ACCU-CHEK GUIDE test strip CHECK FASTING BLOOD SUGAR EVERY MORNING. DX: E11.9 12/18/22  Agapito Games, MD  albuterol (VENTOLIN HFA) 108 (90 Base) MCG/ACT inhaler Inhale 2 puffs into the lungs every 6 (six) hours as needed for wheezing or shortness of breath. 07/20/23   Charlton Amor, DO  dapagliflozin propanediol (FARXIGA) 5 MG TABS tablet Take 5 mg by mouth every other day.    [provider]  finasteride (PROSCAR) 5 MG tablet Take 1 tablet (5 mg total) by mouth daily. 05/13/14   Agapito Games, MD  furosemide (LASIX) 40 MG tablet TAKE 1 TABLET BY MOUTH EVERY OTHER DAY 11/06/23   Duke Salvia, MD  loratadine (CLARITIN) 10 MG tablet Take 10 mg by mouth daily as needed for allergies.     [provider]  Misc. Devices MISC New bipap machine at current settings. 06/16/21    [provider]  oxybutynin (DITROPAN-XL) 10 MG 24 hr tablet Take 10 mg by mouth daily. 03/03/22   Melene Plan, MD  pantoprazole (PROTONIX) 40 MG tablet TAKE 1 TABLET BY MOUTH EVERY DAY 12/19/23   Agapito Games, MD  predniSONE (DELTASONE) 50 MG tablet Take 1 tablet (50 mg total) by mouth daily. 12/27/23   Agapito Games, MD  simvastatin (ZOCOR) 40 MG tablet TAKE 1 TABLET BY MOUTH DAILY AT 6 PM. 09/07/23   Agapito Games, MD  tamsulosin (FLOMAX) 0.4 MG CAPS capsule Take 0.4 mg by mouth daily.     [provider]  tirzepatide [name]) 12.5 MG/0.5ML Pen Inject 12.5 mg into the skin once a week. 08/30/23   Agapito Games, MD  valsartan-hydrochlorothiazide (DIOVAN-HCT) 80-12.5 MG tablet TAKE 1 TABLET BY MOUTH DAILY 12/03/23   Agapito Games, MD    Family History Family History  Problem Relation Age of Onset   COPD Mother    Hypertension Mother    Emphysema Mother     Social History Social History   Tobacco Use   Smoking status: Former    Current packs/day: 0.00    Types: Cigarettes    Quit date: 02/09/1980    Years since quitting: 43.9   Smokeless tobacco: Former  Building services engineer status: Never Used  Substance Use Topics   Alcohol use: Yes    Comment: occasionally   Drug use: No     Allergies   Amoxicillin, Iodinated contrast media, and Lisinopril   Review of Systems Review of Systems  Musculoskeletal:  Positive for back pain.       Right hip pain x 3 weeks     Physical Exam Triage Vital Signs ED Triage Vitals [12/31/23 1232]  Encounter Vitals Group     BP 135/77     Systolic BP Percentile      Diastolic BP Percentile      Pulse Rate 95     Resp 16     Temp (!) 97.5 F (36.4 C)     Temp src      SpO2 98 %     Weight      Height      Head Circumference      Peak Flow      Pain Score 8     Pain Loc      Pain Education      Exclude from Growth Chart    No data found.  Updated Vital Signs BP  135/77   Pulse 95   Temp (!) 97.5 F (36.4 C)   Resp 16   SpO2 98%    Physical Exam  Vitals and nursing note reviewed.  Constitutional:      Appearance: Normal appearance. He is normal weight.  HENT:     Head: Normocephalic and atraumatic.     Right Ear: Tympanic membrane, ear canal and external ear normal.     Left Ear: Tympanic membrane, ear canal and external ear normal.     Mouth/Throat:     Mouth: Mucous membranes are moist.     Pharynx: Oropharynx is clear.  Eyes:     Extraocular Movements: Extraocular movements intact.     Conjunctiva/sclera: Conjunctivae normal.     Pupils: Pupils are equal, round, and reactive to light.  Cardiovascular:     Rate and Rhythm: Normal rate and regular rhythm.     Pulses: Normal pulses.     Heart sounds: Normal heart sounds.  Pulmonary:     Effort: Pulmonary effort is normal.     Breath sounds: Normal breath sounds. No wheezing, rhonchi or rales.  Musculoskeletal:        General: Normal range of motion.     Cervical back: Normal range of motion and neck supple.     Comments: Patient reporting pain over right sided LS spine and superior aspect of right hip, exam limited due to pain today  Skin:    General: Skin is warm and dry.  Neurological:     General: No focal deficit present.     Mental Status: He is alert and oriented to person, place, and time. Mental status is at baseline.  Psychiatric:        Mood and Affect: Mood normal.        Behavior: Behavior normal.      UC Treatments / Results  Labs (all labs ordered are listed, but only abnormal results are displayed) Labs Reviewed - No data to display  EKG   Radiology No results found.  Procedures Procedures (including critical care time)  Medications Ordered in UC Medications - No data to display  Initial Impression / Assessment and Plan / UC Course  I have reviewed the triage vital signs and the nursing notes.  Pertinent labs & imaging results that were available  during my care of the patient were reviewed by me and considered in my medical decision making (see chart for details).     MDM: 1.  Acute midline low back pain without sciatica-x-ray of LS spine revealed above, Rx'd Norco 7.5/325 mg tablet: Take 1 tablet every 6 hours for moderate to severe low back pain; 2.  Right hip pain-right hip x-ray results revealed above, Rx'd Norco 7.5/325 mg tablet: Take 1 tablet every 6 hours for moderate to severe low back pain. Advised patient we will follow-up with x-ray results once received.  Advised patient to take medication as directed with food.  Patient advised of sedative effects of hydrocodone.  Encouraged to increase daily water intake to 64 ounces per day while taking these medications.  Advised patient if symptoms worsen and/or unresolved please follow-up with PCP for further evaluation.  Patient discharged home, hemodynamically stable. Final Clinical Impressions(s) / UC Diagnoses   Final diagnoses:  Acute midline low back pain without sciatica  Right hip pain     Discharge Instructions      Advised patient we will follow-up with x-ray results once received.  Advised patient to take medication as directed with food.  Patient advised of sedative effects of hydrocodone.  Encouraged to increase daily water intake to 64 ounces per day while taking these medications.  Advised patient if symptoms worsen and/or unresolved please follow-up with PCP for further evaluation.     ED Prescriptions     Medication Sig Dispense Auth. Provider   HYDROcodone-acetaminophen (NORCO) 7.5-325 MG tablet Take 1 tablet by mouth every 6 (six) hours as needed for up to 7 days for moderate pain (pain score 4-6). 28 tablet Trevor Iha, FNP      I have reviewed the PDMP during this encounter.   Trevor Iha, FNP 12/31/23 1416

## 2023-12-31 NOTE — ED Triage Notes (Signed)
Pt presents to [facility] with co of right sided back and hip pain for a few weeks. Pcp rx prednisone with no improvement. Pt having difficulty sleeping due to inability to lay down. Pt reports pain is 8/10

## 2023-12-31 NOTE — Discharge Instructions (Addendum)
Advised patient we will follow-up with x-ray results once received.  Advised patient to take medication as directed with food.  Patient advised of sedative effects of hydrocodone.  Encouraged to increase daily water intake to 64 ounces per day while taking these medications.  Advised patient if symptoms worsen and/or unresolved please follow-up with PCP for further evaluation.

## 2024-01-02 ENCOUNTER — Ambulatory Visit (INDEPENDENT_AMBULATORY_CARE_PROVIDER_SITE_OTHER): Payer: Medicare Other | Admitting: Family Medicine

## 2024-01-02 VITALS — BP 89/64 | Ht 69.0 in | Wt 280.0 lb

## 2024-01-02 DIAGNOSIS — M5416 Radiculopathy, lumbar region: Secondary | ICD-10-CM | POA: Diagnosis not present

## 2024-01-02 MED ORDER — HYDROCODONE-ACETAMINOPHEN 7.5-325 MG PO TABS: 1.0000 | ORAL_TABLET | Freq: Four times a day (QID) | ORAL | 0 refills | Status: AC | PRN

## 2024-01-02 MED ORDER — KETOROLAC TROMETHAMINE 60 MG/2ML IM SOLN
60.0000 mg | Freq: Once | INTRAMUSCULAR | Status: AC
  Administered 2024-01-02: 60 mg via INTRAMUSCULAR

## 2024-01-02 MED ORDER — METHYLPREDNISOLONE ACETATE 80 MG/ML IJ SUSP
80.0000 mg | Freq: Once | INTRAMUSCULAR | Status: AC
  Administered 2024-01-02: 80 mg via INTRAMUSCULAR

## 2024-01-02 MED ORDER — NAPROXEN 500 MG PO TABS: 500.0000 mg | ORAL_TABLET | Freq: Two times a day (BID) | ORAL | 1 refills | Status: AC | PRN

## 2024-01-02 NOTE — Patient Instructions (Addendum)
 You have lumbar radiculopathy (a pinched nerve in your low back). We will go ahead with an MRI since you're not improving with conservative treatment to date. You were given shots of toradol  and depo-medrol  today Start naproxen  500mg  twice a day with food for pain and inflammation.- don't take ibuprofen  or aleve  while on this. Hydrocodone  as needed for severe pain (no driving on this medicine). Physical therapy has been shown to be helpful as well but we need to calm the pain down more first. Strengthening of low back muscles, abdominal musculature are key for long term pain relief. Follow up will depend on the MRI results.   [facility] [address] [place], [place] [address] Phone: [phone] Call them to schedule MRI.

## 2024-01-03 NOTE — Progress Notes (Signed)
 PCP: Alvan Dorothyann BIRCH, MD  Subjective:   HPI: Patient is a 78 y.o. male here for low back pain.  Patient reports prior history of some back pain but would typically resolve. History of kidney stones as well though this feels different. No dysuria or hematuria. Current pain started over a month ago more to right side. Feels like it catches in this area of low back when he extends. No radiation into legs, only into gluteal area. No bowel/bladder dysfunction. No numbness/tingling. Has had 2 rounds of prednisone  50mg  without improvement.  Past Medical History:  Diagnosis Date   Allergy    Arthritis    Community acquired pneumonia of left lower lobe of lung 05/15/2018   Dyspnea    with exertion   Dysrhythmia    frequent PVC's   Frequent urination    GERD (gastroesophageal reflux disease)    History of kidney stones    Hyperlipidemia    Hypertension    Kidney stones    x 4    Sleep apnea    BiPAP    Current Outpatient Medications on File Prior to Visit  Medication Sig Dispense Refill   Accu-Chek FastClix Lancets MISC Dx DM E11.9 Check fasting blood sugar daily and after largest meal of the day a couple time a week. 102 each 11   ACCU-CHEK GUIDE test strip CHECK FASTING BLOOD SUGAR EVERY MORNING. DX: E11.9 100 strip 11   albuterol  (VENTOLIN  HFA) 108 (90 Base) MCG/ACT inhaler Inhale 2 puffs into the lungs every 6 (six) hours as needed for wheezing or shortness of breath. 8 g 0   dapagliflozin  propanediol (FARXIGA ) 5 MG TABS tablet Take 5 mg by mouth every other day.     finasteride  (PROSCAR ) 5 MG tablet Take 1 tablet (5 mg total) by mouth daily. 30 tablet 1   furosemide  (LASIX ) 40 MG tablet TAKE 1 TABLET BY MOUTH EVERY OTHER DAY 15 tablet 0   loratadine (CLARITIN) 10 MG tablet Take 10 mg by mouth daily as needed for allergies.      Misc. Devices MISC New bipap machine at current settings.     oxybutynin (DITROPAN-XL) 10 MG 24 hr tablet Take 10 mg by mouth daily.  3    pantoprazole  (PROTONIX ) 40 MG tablet TAKE 1 TABLET BY MOUTH EVERY DAY 90 tablet 3   predniSONE  (DELTASONE ) 50 MG tablet Take 1 tablet (50 mg total) by mouth daily. 5 tablet 0   simvastatin  (ZOCOR ) 40 MG tablet TAKE 1 TABLET BY MOUTH DAILY AT 6 PM. 90 tablet 3   tamsulosin (FLOMAX) 0.4 MG CAPS capsule Take 0.4 mg by mouth daily.      tirzepatide  (MOUNJARO ) 12.5 MG/0.5ML Pen Inject 12.5 mg into the skin once a week. 6 mL 1   valsartan -hydrochlorothiazide  (DIOVAN -HCT) 80-12.5 MG tablet TAKE 1 TABLET BY MOUTH DAILY 90 tablet 0   No current facility-administered medications on file prior to visit.    Past Surgical History:  Procedure Laterality Date   CARPAL TUNNEL RELEASE     Right    CERVICAL FUSION     cervical fusion times 2   EYE SURGERY     FOOT TENDON SURGERY  11/14/2011   Dr. Harden   FRACTURE SURGERY     HERNIA REPAIR     left knee surgery     ACL repair    LIPOMA EXCISION Bilateral    on back   MASS EXCISION N/A 01/24/2018   Procedure: EXCISION OF BACK MASS;  Surgeon: Vanderbilt,  Debby, MD;  Location: [facility] OR;  Service: General;  Laterality: N/A;   SPINE SURGERY     UMBILICAL HERNIA REPAIR      Allergies  Allergen Reactions   Amoxicillin Swelling    Tongue sores   Iodinated Contrast Media Shortness Of Breath and Rash    Patient gets hot and sweating   Lisinopril Cough and Rash    Other reaction(s): Cough    BP (!) 89/64   Ht 5' 9 (1.753 m)   Wt 280 lb (127 kg)   BMI 41.35 kg/m       No data to display              No data to display              Objective:  Physical Exam:  Gen: NAD, comfortable in exam room  Back: No gross deformity, scoliosis. No paraspinal tenderness.  No midline or bony TTP. FROM with pain on extension. Strength LEs 5/5 all muscle groups.   1+ MSRs in patellar and achilles tendons, equal bilaterally. Positive SLR on right, negative left. Sensation intact to light touch bilaterally.   Assessment & Plan:  1. Low back pain  with radiation into right gluteal region - severity of pain concerning for neuropathic pain.  Radiographs were negative - there was some concern for possible pubic ramus subtle fracture but he is not tender in this area and do not see clear fracture line (also no mechanism and would be expected to have improved by this point).  Has tried 2 courses of prednisone  and not improved.  Will proceed with MRI lumbar spine.  IM toradol  and depomedrol given today.  Naproxen  with norco as needed.

## 2024-01-03 NOTE — Telephone Encounter (Signed)
 Went to ED

## 2024-01-06 ENCOUNTER — Ambulatory Visit (HOSPITAL_COMMUNITY)
Admission: RE | Admit: 2024-01-06 | Discharge: 2024-01-06 | Disposition: A | Discharge status: Home / Self Care | Payer: Medicare Other | Source: Ambulatory Visit | Attending: Family Medicine | Admitting: Family Medicine

## 2024-01-06 ENCOUNTER — Ambulatory Visit (HOSPITAL_COMMUNITY): Payer: Medicare Other

## 2024-01-06 ENCOUNTER — Ambulatory Visit (HOSPITAL_COMMUNITY)
Admission: RE | Admit: 2024-01-06 | Payer: Medicare Other | Source: Other Acute Inpatient Hospital | Attending: Anesthesiology | Admitting: Anesthesiology

## 2024-01-06 ENCOUNTER — Encounter (HOSPITAL_COMMUNITY): Payer: Self-pay

## 2024-01-06 ENCOUNTER — Other Ambulatory Visit: Payer: Medicare Other

## 2024-01-06 ENCOUNTER — Ambulatory Visit: Payer: Medicare Other

## 2024-01-06 DIAGNOSIS — M48061 Spinal stenosis, lumbar region without neurogenic claudication: Secondary | ICD-10-CM | POA: Diagnosis not present

## 2024-01-06 DIAGNOSIS — M5416 Radiculopathy, lumbar region: Secondary | ICD-10-CM | POA: Diagnosis not present

## 2024-01-06 DIAGNOSIS — M4725 Other spondylosis with radiculopathy, thoracolumbar region: Secondary | ICD-10-CM | POA: Diagnosis not present

## 2024-01-06 DIAGNOSIS — M2578 Osteophyte, vertebrae: Secondary | ICD-10-CM | POA: Diagnosis not present

## 2024-01-06 DIAGNOSIS — M5116 Intervertebral disc disorders with radiculopathy, lumbar region: Secondary | ICD-10-CM | POA: Diagnosis not present

## 2024-01-16 DIAGNOSIS — R399 Unspecified symptoms and signs involving the genitourinary system: Secondary | ICD-10-CM | POA: Diagnosis not present

## 2024-01-16 DIAGNOSIS — N3943 Post-void dribbling: Secondary | ICD-10-CM | POA: Diagnosis not present

## 2024-01-16 DIAGNOSIS — N401 Enlarged prostate with lower urinary tract symptoms: Secondary | ICD-10-CM | POA: Diagnosis not present

## 2024-01-16 DIAGNOSIS — R109 Unspecified abdominal pain: Secondary | ICD-10-CM | POA: Diagnosis not present

## 2024-01-21 DIAGNOSIS — K5900 Constipation, unspecified: Secondary | ICD-10-CM | POA: Diagnosis not present

## 2024-01-21 DIAGNOSIS — K449 Diaphragmatic hernia without obstruction or gangrene: Secondary | ICD-10-CM | POA: Diagnosis not present

## 2024-01-21 DIAGNOSIS — R109 Unspecified abdominal pain: Secondary | ICD-10-CM | POA: Diagnosis not present

## 2024-01-24 ENCOUNTER — Ambulatory Visit (INDEPENDENT_AMBULATORY_CARE_PROVIDER_SITE_OTHER): Payer: Medicare Other | Admitting: Family Medicine

## 2024-01-24 VITALS — BP 123/72 | Ht 69.0 in | Wt 275.0 lb

## 2024-01-24 DIAGNOSIS — M5416 Radiculopathy, lumbar region: Secondary | ICD-10-CM

## 2024-01-24 NOTE — Progress Notes (Signed)
 PCP: Default, Provider, MD  Subjective:   HPI: Patient is a 78 y.o. male here for low back pain.  2/5: Patient reports prior history of some back pain but would typically resolve. History of kidney stones as well though this feels different. No dysuria or hematuria. Current pain started over a month ago more to right side. Feels like it catches in this area of low back when he extends. No radiation into legs, only into gluteal area. No bowel/bladder dysfunction. No numbness/tingling. Has had 2 rounds of prednisone 50mg  without improvement.  2/27: Patient reports he's doing much better. Back pain is resolved. No radiation into extremities. Not taking naproxen or norco any longer. Has seen urology and negative CT for renal stones.  Past Medical History:  Diagnosis Date   Allergy    Arthritis    Community acquired pneumonia of left lower lobe of lung 05/15/2018   Dyspnea    with exertion   Dysrhythmia    frequent PVC's   Frequent urination    GERD (gastroesophageal reflux disease)    History of kidney stones    Hyperlipidemia    Hypertension    Kidney stones    x 4    Sleep apnea    BiPAP    Current Outpatient Medications on File Prior to Visit  Medication Sig Dispense Refill   Accu-Chek FastClix Lancets MISC Dx DM E11.9 Check fasting blood sugar daily and after largest meal of the day a couple time a week. 102 each 11   ACCU-CHEK GUIDE test strip CHECK FASTING BLOOD SUGAR EVERY MORNING. DX: E11.9 100 strip 11   albuterol (VENTOLIN HFA) 108 (90 Base) MCG/ACT inhaler Inhale 2 puffs into the lungs every 6 (six) hours as needed for wheezing or shortness of breath. 8 g 0   dapagliflozin propanediol (FARXIGA) 5 MG TABS tablet Take 5 mg by mouth every other day.     finasteride (PROSCAR) 5 MG tablet Take 1 tablet (5 mg total) by mouth daily. 30 tablet 1   furosemide (LASIX) 40 MG tablet TAKE 1 TABLET BY MOUTH EVERY OTHER DAY 15 tablet 0   loratadine (CLARITIN) 10 MG tablet  Take 10 mg by mouth daily as needed for allergies.      Misc. Devices MISC New bipap machine at current settings.     naproxen (NAPROSYN) 500 MG tablet Take 1 tablet (500 mg total) by mouth 2 (two) times daily as needed. 60 tablet 1   oxybutynin (DITROPAN-XL) 10 MG 24 hr tablet Take 10 mg by mouth daily.  3   pantoprazole (PROTONIX) 40 MG tablet TAKE 1 TABLET BY MOUTH EVERY DAY 90 tablet 3   predniSONE (DELTASONE) 50 MG tablet Take 1 tablet (50 mg total) by mouth daily. 5 tablet 0   simvastatin (ZOCOR) 40 MG tablet TAKE 1 TABLET BY MOUTH DAILY AT 6 PM. 90 tablet 3   tamsulosin (FLOMAX) 0.4 MG CAPS capsule Take 0.4 mg by mouth daily.      tirzepatide (MOUNJARO) 12.5 MG/0.5ML Pen Inject 12.5 mg into the skin once a week. 6 mL 1   valsartan-hydrochlorothiazide (DIOVAN-HCT) 80-12.5 MG tablet TAKE 1 TABLET BY MOUTH DAILY 90 tablet 0   No current facility-administered medications on file prior to visit.    Past Surgical History:  Procedure Laterality Date   CARPAL TUNNEL RELEASE     Right    CERVICAL FUSION     cervical fusion times 2   EYE SURGERY     FOOT TENDON SURGERY  11/14/2011   Dr. Lajoyce Corners   FRACTURE SURGERY     HERNIA REPAIR     left knee surgery     ACL repair    LIPOMA EXCISION Bilateral    on back   MASS EXCISION N/A 01/24/2018   Procedure: EXCISION OF BACK MASS;  Surgeon: Harriette Bouillon, MD;  Location: [facility] OR;  Service: General;  Laterality: N/A;   SPINE SURGERY     UMBILICAL HERNIA REPAIR      Allergies  Allergen Reactions   Amoxicillin Swelling    Tongue sores   Iodinated Contrast Media Shortness Of Breath and Rash    Patient gets hot and sweating   Lisinopril Cough and Rash    Other reaction(s): Cough    BP 123/72   Ht 5\' 9"  (1.753 m)   Wt 275 lb (124.7 kg)   BMI 40.61 kg/m       No data to display              No data to display              Objective:  Physical Exam:  Gen: NAD, comfortable in exam room  Back: No gross deformity,  scoliosis. No tenderness. FROM. Strength LEs 5/5 all muscle groups.   Negative SLRs. Negative logroll bilateral hips.   Assessment & Plan:  1. Lumbar radiculopathy - symptoms resolved with IM toradol and depomedrol, naproxen with norco as needed.  MRI without compressive lesion.  Core strengthening encouraged.  Naproxen if needed.  Follow up as needed.

## 2024-01-30 DIAGNOSIS — H6123 Impacted cerumen, bilateral: Secondary | ICD-10-CM | POA: Diagnosis not present

## 2024-02-11 ENCOUNTER — Ambulatory Visit: Payer: Medicare Other | Attending: Internal Medicine | Admitting: Internal Medicine

## 2024-02-11 ENCOUNTER — Encounter: Payer: Self-pay | Admitting: Internal Medicine

## 2024-02-11 VITALS — BP 134/88 | HR 99 | Ht 69.0 in | Wt 290.2 lb

## 2024-02-11 DIAGNOSIS — R002 Palpitations: Secondary | ICD-10-CM | POA: Insufficient documentation

## 2024-02-11 DIAGNOSIS — I5032 Chronic diastolic (congestive) heart failure: Secondary | ICD-10-CM | POA: Diagnosis not present

## 2024-02-11 DIAGNOSIS — I480 Paroxysmal atrial fibrillation: Secondary | ICD-10-CM | POA: Diagnosis not present

## 2024-02-11 DIAGNOSIS — Z01812 Encounter for preprocedural laboratory examination: Secondary | ICD-10-CM | POA: Insufficient documentation

## 2024-02-11 DIAGNOSIS — R001 Bradycardia, unspecified: Secondary | ICD-10-CM | POA: Diagnosis not present

## 2024-02-11 DIAGNOSIS — E039 Hypothyroidism, unspecified: Secondary | ICD-10-CM | POA: Insufficient documentation

## 2024-02-11 MED ORDER — APIXABAN 5 MG PO TABS: 5.0000 mg | ORAL_TABLET | Freq: Two times a day (BID) | ORAL | 1 refills | Status: DC

## 2024-02-11 NOTE — Patient Instructions (Addendum)
 Medication Instructions:  Your physician has recommended you make the following change in your medication:   ** Begin Eliquis 5mg  - 1 tablet by mouth twice daily.  *If you need a refill on your cardiac medications before your next appointment, please call your pharmacy*   Lab Work: CBC and BMET, TSH today  If you have labs (blood work) drawn today and your tests are completely normal, you will receive your results only by: MyChart Message (if you have MyChart) OR A paper copy in the mail If you have any lab test that is abnormal or we need to change your treatment, we will call you to review the results.   Testing/Procedures:  Your physician has requested that you have an echocardiogram. Echocardiography is a painless test that uses sound waves to create images of your heart. It provides your doctor with information about the size and shape of your heart and how well your heart's chambers and valves are working. This procedure takes approximately one hour. There are no restrictions for this procedure. Please do NOT wear cologne, perfume, aftershave, or lotions (deodorant is allowed). Please arrive 15 minutes prior to your appointment time.  Please note: We ask at that you not bring children with you during ultrasound (echo/ vascular) testing. Due to room size and safety concerns, children are not allowed in the ultrasound rooms during exams. Our front office staff cannot provide observation of children in our lobby area while testing is being conducted. An adult accompanying a patient to their appointment will only be allowed in the ultrasound room at the discretion of the ultrasound technician under special circumstances. We apologize for any inconvenience.  Your physician has recommended that you have a Cardioversion (DCCV). Electrical Cardioversion uses a jolt of electricity to your heart either through paddles or wired patches attached to your chest. This is a controlled, usually  prescheduled, procedure. Defibrillation is done under light anesthesia in the hospital, and you usually go home the day of the procedure. This is done to get your heart back into a normal rhythm. You are not awake for the procedure. Please see the instruction sheet given to you today.    Follow-Up: At [facility], you and your health needs are our priority.  As part of our continuing mission to provide you with exceptional heart care, we have created designated Provider Care Teams.  These Care Teams include your primary Cardiologist (physician) and Advanced Practice Providers (APPs -  Physician Assistants and Nurse Practitioners) who all work together to provide you with the care you need, when you need it.  We recommend signing up for the patient portal called "MyChart".  Sign up information is provided on this After Visit Summary.  MyChart is used to connect with patients for Virtual Visits (Telemedicine).  Patients are able to view lab/test results, encounter notes, upcoming appointments, etc.  Non-urgent messages can be sent to your provider as well.   To learn more about what you can do with MyChart, go to [email or web address].    Your next appointment:   To be scheduled  Other Instructions  Alive Cor monitor as discussed by Dr Graciela Husbands     Dear Tracy Bennett   You are scheduled for a Cardioversion on Monday, April 21 with Dr. [name].  Please arrive at the [facility] (Main Entrance A) at [facility]: [address] [place], [place] [address] at 730am (This time is 1.5 hour(s) before your procedure to ensure your preparation).   Free  valet parking service is available. You will check in at ADMITTING.   *Please Note: You will receive a call the day before your procedure to confirm the appointment time. That time may have changed from the original time based on the schedule for that day.*   DIET:  Nothing to eat or drink after midnight except a sip of water with  medications (see medication instructions below)  MEDICATION INSTRUCTIONS: !!IF ANY NEW MEDICATIONS ARE STARTED AFTER TODAY, PLEASE NOTIFY YOUR PROVIDER AS SOON AS POSSIBLE!!  FYI: Medications such as Semaglutide (Ozempic, [place]), Tirzepatide (Mounjaro, Zepbound), Dulaglutide (Trulicity), etc ("GLP1 agonists") AND Canagliflozin (Invokana), Dapagliflozin (Farxiga), Empagliflozin (Jardiance), Ertugliflozin (Steglatro), Bexagliflozin [employer]) or any combination with one of these drugs such as Invokamet (Canagliflozin/Metformin), Synjardy (Empagliflozin/Metformin), etc ("SGLT2 inhibitors") must be held around the time of a procedure. This is not a comprehensive list of all of these drugs. Please review all of your medications and talk to your provider if you take any one of these. If you are not sure, ask your provider.   HOLD: Tirzepatide (Mounjaro, Zepbound) for 1 week prior to the procedure. Last dose on Sunday, April 13.  Hold you [name] for 3 days prior to procedure.  Your last dose will be on April 18.  Continue taking your anticoagulant (blood thinner): Apixaban (Eliquis).  You will need to continue this after your procedure until you are told by your provider that it is safe to stop.    ** Do not take Furosemide the morning of your procedure.  LABS: Your labs will be done at the hospital prior to your procedure - you will need to arrive 1 and 1/2 hours prior to your procedure.  FYI:  For your safety, and to allow [place] to monitor your vital signs accurately during the surgery/procedure we request: If you have artificial nails, gel coating, SNS etc, please have those removed prior to your surgery/procedure. Not having the nail coverings /polish removed may result in cancellation or delay of your surgery/procedure.  Your support person will be asked to wait in the waiting room during your procedure.  It is OK to have someone drop you off and come back when you are ready to be discharged.   You cannot drive after the procedure and will need someone to drive you home.  Bring your insurance cards.  *Special Note: Every effort is made to have your procedure done on time. Occasionally there are emergencies that occur at the hospital that may cause delays. Please be patient if a delay does occur.

## 2024-02-11 NOTE — Progress Notes (Unsigned)
 Patient Care Team: Default, Provider, MD as PCP - General Reesa Chew, MD as Referring Physician (Gastroenterology) Duke Salvia, MD as Consulting Physician (Cardiology) Melene Plan, MD as Referring Physician (Urology) Loren Racer, MD as Referring Physician (Ophthalmology)  CC PVCs  HPI  Tracy Bennett is a 78 y.o. male Seen in follow-up for PVCs -RVOT. He had no associated symptoms.  Also hypertension  There was also a question at the initial consultation regarding long QT syndrome for which I found no evidence.   The patient denies chest pain, shortness of breat, nocturnal dyspnea, orthopnea or peripheral edema (See Below) .  There have been no palpitations, lightheadedness or syncope .   For Tracy Bennett has lost 60 pounds.  Breathing is much improved.  Still uses BiPAP  Perhaps he heard the term atrial fibrillation 6 years ago when he had some eye surgery and they had to hold off on it.  Not sure.   Thromboembolic risk factors ( age -81, HTN-1, DM-1) for a CHADSVASc Score of >=4   DATE TEST EF   11/16 Echo    60 %   11/17 Echo   60-65 %           Date Cr K LDL  9/16   75  12/18 1.24 4.1   10/21 1.46 4.5   11/22 1.18 3.7      Past Medical History:  Diagnosis Date   Allergy    Arthritis    Community acquired pneumonia of left lower lobe of lung 05/15/2018   Dyspnea    with exertion   Dysrhythmia    frequent PVC's   Frequent urination    GERD (gastroesophageal reflux disease)    History of kidney stones    Hyperlipidemia    Hypertension    Kidney stones    x 4    Sleep apnea    BiPAP    Past Surgical History:  Procedure Laterality Date   CARPAL TUNNEL RELEASE     Right    CERVICAL FUSION     cervical fusion times 2   EYE SURGERY     FOOT TENDON SURGERY  11/14/2011   Dr. Lajoyce Corners   FRACTURE SURGERY     HERNIA REPAIR     left knee surgery     ACL repair    LIPOMA EXCISION Bilateral    on back   MASS EXCISION N/A  01/24/2018   Procedure: EXCISION OF BACK MASS;  Surgeon: Harriette Bouillon, MD;  Location: [facility] OR;  Service: General;  Laterality: N/A;   SPINE SURGERY     UMBILICAL HERNIA REPAIR      Current Outpatient Medications  Medication Sig Dispense Refill   Accu-Chek FastClix Lancets MISC Dx DM E11.9 Check fasting blood sugar daily and after largest meal of the day a couple time a week. 102 each 11   ACCU-CHEK GUIDE test strip CHECK FASTING BLOOD SUGAR EVERY MORNING. DX: E11.9 100 strip 11   albuterol (VENTOLIN HFA) 108 (90 Base) MCG/ACT inhaler Inhale 2 puffs into the lungs every 6 (six) hours as needed for wheezing or shortness of breath. 8 g 0   dapagliflozin propanediol (FARXIGA) 5 MG TABS tablet Take 5 mg by mouth every other day.     finasteride (PROSCAR) 5 MG tablet Take 1 tablet (5 mg total) by mouth daily. 30 tablet 1   furosemide (LASIX) 40 MG tablet TAKE 1 TABLET BY MOUTH EVERY OTHER DAY 15 tablet 0  loratadine (CLARITIN) 10 MG tablet Take 10 mg by mouth daily as needed for allergies.      Misc. Devices MISC New bipap machine at current settings.     naproxen (NAPROSYN) 500 MG tablet Take 1 tablet (500 mg total) by mouth 2 (two) times daily as needed. 60 tablet 1   oxybutynin (DITROPAN-XL) 10 MG 24 hr tablet Take 10 mg by mouth daily.  3   pantoprazole (PROTONIX) 40 MG tablet TAKE 1 TABLET BY MOUTH EVERY DAY 90 tablet 3   simvastatin (ZOCOR) 40 MG tablet TAKE 1 TABLET BY MOUTH DAILY AT 6 PM. 90 tablet 3   tamsulosin (FLOMAX) 0.4 MG CAPS capsule Take 0.4 mg by mouth daily.      tirzepatide (MOUNJARO) 12.5 MG/0.5ML Pen Inject 12.5 mg into the skin once a week. 6 mL 1   valsartan-hydrochlorothiazide (DIOVAN-HCT) 80-12.5 MG tablet TAKE 1 TABLET BY MOUTH DAILY 90 tablet 0   No current facility-administered medications for this visit.    Allergies  Allergen Reactions   Amoxicillin Swelling    Tongue sores   Iodinated Contrast Media Shortness Of Breath and Rash    Patient gets hot and  sweating   Lisinopril Cough and Rash    Other reaction(s): Cough      Review of Systems negative except from HPI and PMH  Physical Exam BP 134/88   Pulse 99   Ht 5\' 9"  (1.753 m)   Wt 290 lb 3.2 oz (131.6 kg)   SpO2 98%   BMI 42.86 kg/m  Well developed and nourished in no acute distress HENT normal Neck supple with JVP-  flat  Clear Irregular irregular rate and rhythm with controlled ventricular response, no murmurs or gallops Abd-soft with active BS No Clubbing cyanosis edema Skin-warm and dry A & Oriented  Grossly normal sensory and motor function  ECG atrial fibrillation at 99  -/08/31  Interval  Assessment and  Plan  Atrial fibrillation  PVCs-RVOT  ECG abnormal chronic T wave inversions    Hypertension and hypertensive heart disease  HFpEF  Sinus Bradycardia   Morbidly obese  Persistent atrial fibrillation newly identified.  No clear symptoms, but based on EAST AF-Net we will pursue sinus rhythm.  DC cardioversion   as he is unaware of his atrial fibrillation I have advised him to get an AliveCor monitor to use it every day or every other day to look for recurrences of A-fib.   Will begin him on anticoagulation with Eliquis 5 twice daily based on his last creatinine and will recheck that today.  Anticipate cardioversion in 4 weeks.  Will repeat the echocardiogram.  Failed to order TSH but will do so.  Right now we will continue him on his SGLT2 for his HFpEF.  Blood pressure is well-controlled and they have been able to down titrate some of his medications with his weight loss.

## 2024-02-12 ENCOUNTER — Telehealth: Payer: Self-pay | Admitting: Internal Medicine

## 2024-02-12 ENCOUNTER — Telehealth: Payer: Self-pay | Admitting: Pharmacy Technician

## 2024-02-12 ENCOUNTER — Other Ambulatory Visit (HOSPITAL_COMMUNITY): Payer: Self-pay

## 2024-02-12 LAB — CBC
Hematocrit: 47.8 % (ref 37.5–51.0)
Hemoglobin: 16 g/dL (ref 13.0–17.7)
MCH: 30.4 pg (ref 26.6–33.0)
MCHC: 33.5 g/dL (ref 31.5–35.7)
MCV: 91 fL (ref 79–97)
Platelets: 141 10*3/uL — ABNORMAL LOW (ref 150–450)
RBC: 5.27 x10E6/uL (ref 4.14–5.80)
RDW: 14.2 % (ref 11.6–15.4)
WBC: 6.2 10*3/uL (ref 3.4–10.8)

## 2024-02-12 LAB — BASIC METABOLIC PANEL
BUN/Creatinine Ratio: 8 — ABNORMAL LOW (ref 10–24)
BUN: 8 mg/dL (ref 8–27)
CO2: 23 mmol/L (ref 20–29)
Calcium: 9.4 mg/dL (ref 8.6–10.2)
Chloride: 105 mmol/L (ref 96–106)
Creatinine, Ser: 0.99 mg/dL (ref 0.76–1.27)
Glucose: 83 mg/dL (ref 70–99)
Potassium: 4.1 mmol/L (ref 3.5–5.2)
Sodium: 143 mmol/L (ref 134–144)
eGFR: 78 mL/min/{1.73_m2} (ref >59)

## 2024-02-12 LAB — TSH: TSH: 1.06 u[IU]/mL (ref 0.450–4.500)

## 2024-02-12 NOTE — Telephone Encounter (Signed)
 Returned call to patient,    Patient states he was told by Mindi Junker to all back if had any issues. He states he went to get the prescription and it was going to be 800 dollars. He states he was told that was with insurance. Advised patient should also call his insurance to verify if his plan changed, likely needs to meet his deductible for the year before it goes back to normal pricing.   He also states that when he discussed the medication with the MD, he thought he would only be taking for 30 days, so he believes a 90 day script was incorrectly sent.   Advised pt. To call insurance to verify deductible, we will route to provider and Prior auth pool for insurance checking.

## 2024-02-12 NOTE — Telephone Encounter (Signed)
 Pharmacy Patient Advocate Encounter   Received notification from Pt Calls Messages that prior authorization for Eliquis is required/requested.   Insurance verification completed.   The patient is insured through [facility] .   Per test claim: Refill too soon. PA is not needed at this time. Medication was filled 02/12/24. Next eligible fill date is 03/05/24.

## 2024-02-12 NOTE — Telephone Encounter (Signed)
 Spoke with pt who reports his deducible for Eliquis is $590 which he has not yet met for the year.  Pt states he plans to request a 30 day supply from the pharmacy to begin with.  Pt advised also of [place] pharmacy option and will send information through MyChart.  Pt verbalizes understanding and agrees with current plan.

## 2024-02-12 NOTE — Telephone Encounter (Signed)
 Pt c/o medication issue:  1. Name of Medication:   apixaban (ELIQUIS) 5 MG TABS tablet   2. How are you currently taking this medication (dosage and times per day)?   Not starting yet  3. Are you having a reaction (difficulty breathing--STAT)?   4. What is your medication issue?   Patient wants a call back from RN Mindi Junker to discuss starting on this medication.

## 2024-02-16 ENCOUNTER — Other Ambulatory Visit: Payer: Self-pay | Admitting: Family Medicine

## 2024-02-18 ENCOUNTER — Telehealth: Payer: Self-pay | Admitting: Internal Medicine

## 2024-02-18 MED ORDER — FUROSEMIDE 40 MG PO TABS: 40.0000 mg | ORAL_TABLET | ORAL | 3 refills | Status: AC

## 2024-02-18 NOTE — Telephone Encounter (Signed)
*  STAT* If patient is at the pharmacy, call can be transferred to refill team.   1. Which medications need to be refilled? (please list name of each medication and dose if known) furosemide (LASIX) 40 MG tablet    TAKE 1 TABLET BY MOUTH EVERY OTHER DAY   2. Would you like to learn more about the convenience, safety, & potential cost savings by using the [facility]? No   3. Are you open to using the [facility] No   4. Which pharmacy/location (including street and city if local pharmacy) is medication to be sent to?[id number] - [place], [place] - [address]    5. Do they need a 30 day or 90 day supply? 90 Day Supply  Pt was seen by Dr. Graciela Husbands on 02/11/24

## 2024-02-18 NOTE — Telephone Encounter (Signed)
 Pt's medication was sent to pt's pharmacy as requested. Confirmation received.

## 2024-02-25 DIAGNOSIS — G4733 Obstructive sleep apnea (adult) (pediatric): Secondary | ICD-10-CM | POA: Diagnosis not present

## 2024-02-25 DIAGNOSIS — Z6841 Body Mass Index (BMI) 40.0 and over, adult: Secondary | ICD-10-CM | POA: Diagnosis not present

## 2024-02-27 ENCOUNTER — Other Ambulatory Visit: Payer: Self-pay | Admitting: Family Medicine

## 2024-02-27 DIAGNOSIS — E118 Type 2 diabetes mellitus with unspecified complications: Secondary | ICD-10-CM

## 2024-02-27 NOTE — Telephone Encounter (Signed)
 Copied from CRM [id number]. Topic: Clinical - Medication Refill >> Feb 27, 2024 10:25 AM Thomes Dinning wrote: Most Recent Primary Care Visit:  Provider: Nani Gasser D  Department: [facility]  Visit Type: ACUTE  Date: 12/27/2023  Medication:  ACCU-CHEK GUIDE test strip Accu-Chek FastClix Lancets MISC  Has the patient contacted their pharmacy? Yes (Agent: If no, request that the patient contact the pharmacy for the refill. If patient does not wish to contact the pharmacy document the reason why and proceed with request.) (Agent: If yes, when and what did the pharmacy advise?)  Is this the correct pharmacy for this prescription? Yes If no, delete pharmacy and type the correct one.  This is the patient's preferred pharmacy:  [id number] - [place], [place] - [address] AT [facility] OF [address] [place] [address] Phone: [phone] Fax: [phone]  Has the prescription been filled recently? No  Is the patient out of the medication? Yes  Has the patient been seen for an appointment in the last year OR does the patient have an upcoming appointment? Yes  Can we respond through MyChart? Yes  Agent: Please be advised that Rx refills may take up to 3 business days. We ask that you follow-up with your pharmacy.

## 2024-02-28 ENCOUNTER — Ambulatory Visit (HOSPITAL_BASED_OUTPATIENT_CLINIC_OR_DEPARTMENT_OTHER)
Admission: RE | Admit: 2024-02-28 | Discharge: 2024-02-28 | Disposition: A | Discharge status: Home / Self Care | Source: Ambulatory Visit | Attending: Internal Medicine | Admitting: Internal Medicine

## 2024-02-28 DIAGNOSIS — I480 Paroxysmal atrial fibrillation: Secondary | ICD-10-CM | POA: Diagnosis not present

## 2024-02-28 LAB — ECHOCARDIOGRAM COMPLETE
AR max vel: 2.99 cm2
AV Area VTI: 3.2 cm2
AV Area mean vel: 3 cm2
AV Mean grad: 4.3 mmHg
AV Peak grad: 7.3 mmHg
Ao pk vel: 1.36 m/s
Area-P 1/2: 4.01 cm2
Calc EF: 56.4 %
S' Lateral: 3.6 cm
Single Plane A2C EF: 56.7 %
Single Plane A4C EF: 56.3 %

## 2024-02-28 MED ORDER — ACCU-CHEK FASTCLIX LANCETS MISC: 11 refills | Status: AC

## 2024-02-29 ENCOUNTER — Other Ambulatory Visit: Payer: Self-pay | Admitting: Family Medicine

## 2024-02-29 DIAGNOSIS — E118 Type 2 diabetes mellitus with unspecified complications: Secondary | ICD-10-CM

## 2024-02-29 MED ORDER — BLOOD GLUCOSE MONITORING SUPPL DEVI: 1.0000 | Freq: Three times a day (TID) | 0 refills | Status: AC

## 2024-02-29 MED ORDER — LANCET DEVICE MISC: 1.0000 | Freq: Every day | 99 refills | Status: AC | PRN

## 2024-02-29 MED ORDER — BLOOD GLUCOSE TEST VI STRP
1.0000 | ORAL_STRIP | Freq: Every day | 99 refills | Status: DC | PRN
Start: 2024-02-29 — End: 2024-03-24

## 2024-02-29 MED ORDER — LANCETS MISC. MISC: 1.0000 | Freq: Every day | 99 refills | Status: AC | PRN

## 2024-02-29 NOTE — Telephone Encounter (Signed)
Rx pended for provider sign off.

## 2024-02-29 NOTE — Telephone Encounter (Signed)
 Meds ordered this encounter  Medications   Blood Glucose Monitoring Suppl DEVI    Sig: 1 each by Does not apply route in the morning, at noon, and at bedtime. E11.9, May substitute to any manufacturer covered by patient's insurance.    Dispense:  1 each    Refill:  0   Glucose Blood (BLOOD GLUCOSE TEST STRIPS) STRP    Sig: 1 each by In Vitro route daily as needed. May substitute to any manufacturer covered by patient's insurance.    Dispense:  100 strip    Refill:  PRN   Lancet Device MISC    Sig: 1 each by Does not apply route daily as needed. May substitute to any manufacturer covered by patient's insurance.    Dispense:  1 each    Refill:  PRN   Lancets Misc. MISC    Sig: 1 each by Does not apply route daily as needed. May substitute to any manufacturer covered by patient's insurance.    Dispense:  100 each    Refill:  PRN

## 2024-02-29 NOTE — Telephone Encounter (Signed)
 [facility] called and spoke to [place], Pensions consultant about the refill(s) Test strips and lancets requested. Advised lancets was sent on 02/27/24. She says they did not receive anything regarding lancets. I asked if the accu-chek fastclix lancets and accu-chek guide test strips will work in the same meter. She says no and since a meter is not on his profile and the strips were last sent in over a year ago, it will be better for the provider to submit a new Rx for a meter and testing supplies. She says 3 Rx for Glucose meter kit, testing supplies, and lancets will take care of what is needed. The patient will need a new meter and the supplies will work with that meter. Advised I will send this to the provider.

## 2024-02-29 NOTE — Telephone Encounter (Signed)
 Copied from CRM [id number]. Topic: Clinical - Medication Refill >> Feb 27, 2024 10:25 AM Thomes Dinning wrote: Most Recent Primary Care Visit:  Provider: Nani Gasser D  Department: [facility]  Visit Type: ACUTE  Date: 12/27/2023  Medication:  ACCU-CHEK GUIDE test strip Accu-Chek FastClix Lancets MISC  Has the patient contacted their pharmacy? Yes (Agent: If no, request that the patient contact the pharmacy for the refill. If patient does not wish to contact the pharmacy document the reason why and proceed with request.) (Agent: If yes, when and what did the pharmacy advise?)  Is this the correct pharmacy for this prescription? Yes If no, delete pharmacy and type the correct one.  This is the patient's preferred pharmacy:  [id number] - [place], [place] - [address] AT [facility] OF [address] [place] [address] Phone: [phone] Fax: [phone]  Has the prescription been filled recently? No  Is the patient out of the medication? Yes  Has the patient been seen for an appointment in the last year OR does the patient have an upcoming appointment? Yes  Can we respond through MyChart? Yes  Agent: Please be advised that Rx refills may take up to 3 business days. We ask that you follow-up with your pharmacy. >> Feb 29, 2024 12:02 PM Dennison Nancy wrote: Patient checking on status of the refill and pharmacy do not have the 2 refills , checking on status of medication refills for the Accu-chek test strip, and the lancets  pharmacy told patient to reachout to his provider  to send 2 new  prescription Please call patient to let know the status at [phone]  [id number] - [place], [place] - [address] AT [facility] OF [address]  [address] [place] [address]  Phone: [phone] Fax: [phone]  Hours: Not open 24 hours   >> Feb 27, 2024  2:29 PM Prudencio Pair wrote: Patient called stating that he went  by [facility] and they still haven't received the refill requests. Advised pt that the refill requests has been sent to provider. Once provider authorizes, it will be sent to pharmacy. Patient states he's never heard of it taking that long since Tonya always send it right away. Please reach out to patient once refill requests has been sent to pharmacy. CB #: [id number].

## 2024-03-01 ENCOUNTER — Other Ambulatory Visit: Payer: Self-pay | Admitting: Family Medicine

## 2024-03-02 ENCOUNTER — Other Ambulatory Visit: Payer: Self-pay | Admitting: Family Medicine

## 2024-03-03 NOTE — Telephone Encounter (Signed)
Rx written by historical provider.

## 2024-03-09 NOTE — Pre-Procedure Instructions (Addendum)
 Attempted to call patient regarding procedure scheduled on 4/21 with anesthesia.  Anesthesia requires Mounjaro to be held for 7 days before procedure.  Left voicemail that the last day able to take Mounjaro is today 4/13.

## 2024-03-14 ENCOUNTER — Ambulatory Visit: Attending: Cardiology | Admitting: *Deleted

## 2024-03-14 VITALS — BP 112/70 | HR 75 | Ht 69.0 in | Wt 284.6 lb

## 2024-03-14 DIAGNOSIS — I480 Paroxysmal atrial fibrillation: Secondary | ICD-10-CM | POA: Diagnosis not present

## 2024-03-14 NOTE — Progress Notes (Addendum)
 Called patient with pre-procedure instructions for Monday April 21st.   Patient informed of:   Time to arrive for procedure. 0715 Remain NPO past midnight.  Must have a ride home and a responsible adult to remain with them for 24 hours post procedure.  Confirmed blood thinner. Eliquis  Confirmed no breaks in taking blood thinner for 3+ weeks prior to procedure. Confirmed patient stopped all GLP-1s and GLP-2s for at least one week before procedure. Last Mounjaro  dose 03/09/24

## 2024-03-14 NOTE — Progress Notes (Signed)
   Nurse Visit   Date of Encounter: 03/14/2024 ID: Tracy Bennett, DOB 1946/10/27, MRN [id number]  PCP:  Cydney Draft, MD   [facility] Providers Cardiologist:  None      Visit Details   VS:  BP 112/70 (BP Location: Left Arm, Patient Position: Sitting)   Pulse 75   Ht 5\' 9"  (1.753 m)   Wt 284 lb 9.6 oz (129.1 kg)   SpO2 95%   BMI 42.03 kg/m  , BMI Body mass index is 42.03 kg/m.  Wt Readings from Last 3 Encounters:  03/14/24 284 lb 9.6 oz (129.1 kg)  02/11/24 290 lb 3.2 oz (131.6 kg)  01/24/24 275 lb (124.7 kg)     Reason for visit: EKG prior to cardioversion Performed today: Vitals, EKG, Provider consulted:DOD Albert Huff), and Education Changes (medications, testing, etc.) : Continue with cardioversion Length of Visit: 10 minutes    Medications Adjustments/Labs and Tests Ordered: Orders Placed This Encounter  Procedures   EKG 12-Lead   No orders of the defined types were placed in this encounter.    Signed, Isobel Marie, RN  03/14/2024 11:52 AM

## 2024-03-17 ENCOUNTER — Ambulatory Visit (HOSPITAL_COMMUNITY)
Admission: RE | Admit: 2024-03-17 | Discharge: 2024-03-17 | Disposition: A | Discharge status: Home / Self Care | Attending: Internal Medicine | Admitting: Internal Medicine

## 2024-03-17 ENCOUNTER — Telehealth: Payer: Self-pay

## 2024-03-17 ENCOUNTER — Ambulatory Visit (HOSPITAL_COMMUNITY): Admitting: Anesthesiology

## 2024-03-17 ENCOUNTER — Other Ambulatory Visit: Payer: Self-pay

## 2024-03-17 ENCOUNTER — Encounter (HOSPITAL_COMMUNITY)
Admission: RE | Disposition: A | Discharge status: Home / Self Care | Payer: Self-pay | Source: Home / Self Care | Attending: Internal Medicine

## 2024-03-17 ENCOUNTER — Other Ambulatory Visit (HOSPITAL_COMMUNITY): Payer: Self-pay

## 2024-03-17 ENCOUNTER — Telehealth: Payer: Self-pay | Admitting: Pharmacy Technician

## 2024-03-17 ENCOUNTER — Encounter (HOSPITAL_COMMUNITY): Payer: Self-pay | Admitting: Internal Medicine

## 2024-03-17 DIAGNOSIS — Z87891 Personal history of nicotine dependence: Secondary | ICD-10-CM | POA: Diagnosis not present

## 2024-03-17 DIAGNOSIS — I493 Ventricular premature depolarization: Secondary | ICD-10-CM | POA: Diagnosis not present

## 2024-03-17 DIAGNOSIS — Z7901 Long term (current) use of anticoagulants: Secondary | ICD-10-CM | POA: Diagnosis not present

## 2024-03-17 DIAGNOSIS — I4819 Other persistent atrial fibrillation: Secondary | ICD-10-CM | POA: Insufficient documentation

## 2024-03-17 DIAGNOSIS — G4730 Sleep apnea, unspecified: Secondary | ICD-10-CM | POA: Insufficient documentation

## 2024-03-17 DIAGNOSIS — Z6841 Body Mass Index (BMI) 40.0 and over, adult: Secondary | ICD-10-CM | POA: Insufficient documentation

## 2024-03-17 DIAGNOSIS — I110 Hypertensive heart disease with heart failure: Secondary | ICD-10-CM | POA: Insufficient documentation

## 2024-03-17 DIAGNOSIS — Z7985 Long-term (current) use of injectable non-insulin antidiabetic drugs: Secondary | ICD-10-CM | POA: Insufficient documentation

## 2024-03-17 DIAGNOSIS — G4733 Obstructive sleep apnea (adult) (pediatric): Secondary | ICD-10-CM | POA: Diagnosis not present

## 2024-03-17 DIAGNOSIS — E119 Type 2 diabetes mellitus without complications: Secondary | ICD-10-CM | POA: Diagnosis not present

## 2024-03-17 DIAGNOSIS — I5030 Unspecified diastolic (congestive) heart failure: Secondary | ICD-10-CM | POA: Diagnosis not present

## 2024-03-17 DIAGNOSIS — I4891 Unspecified atrial fibrillation: Secondary | ICD-10-CM

## 2024-03-17 DIAGNOSIS — I480 Paroxysmal atrial fibrillation: Secondary | ICD-10-CM

## 2024-03-17 DIAGNOSIS — I10 Essential (primary) hypertension: Secondary | ICD-10-CM

## 2024-03-17 DIAGNOSIS — R001 Bradycardia, unspecified: Secondary | ICD-10-CM | POA: Insufficient documentation

## 2024-03-17 DIAGNOSIS — Z79899 Other long term (current) drug therapy: Secondary | ICD-10-CM | POA: Diagnosis not present

## 2024-03-17 DIAGNOSIS — I5032 Chronic diastolic (congestive) heart failure: Secondary | ICD-10-CM | POA: Diagnosis not present

## 2024-03-17 DIAGNOSIS — E66813 Obesity, class 3: Secondary | ICD-10-CM | POA: Insufficient documentation

## 2024-03-17 DIAGNOSIS — E1122 Type 2 diabetes mellitus with diabetic chronic kidney disease: Secondary | ICD-10-CM | POA: Diagnosis not present

## 2024-03-17 DIAGNOSIS — N183 Chronic kidney disease, stage 3 unspecified: Secondary | ICD-10-CM | POA: Diagnosis not present

## 2024-03-17 DIAGNOSIS — I130 Hypertensive heart and chronic kidney disease with heart failure and stage 1 through stage 4 chronic kidney disease, or unspecified chronic kidney disease: Secondary | ICD-10-CM | POA: Diagnosis not present

## 2024-03-17 HISTORY — PX: CARDIOVERSION: EP1203

## 2024-03-17 LAB — POCT I-STAT, CHEM 8
BUN: 10 mg/dL (ref 8–23)
Calcium, Ion: 1.17 mmol/L (ref 1.15–1.40)
Chloride: 102 mmol/L (ref 98–111)
Creatinine, Ser: 1.1 mg/dL (ref 0.61–1.24)
Glucose, Bld: 101 mg/dL — ABNORMAL HIGH (ref 70–99)
HCT: 44 % (ref 39.0–52.0)
Hemoglobin: 15 g/dL (ref 13.0–17.0)
Potassium: 3.3 mmol/L — ABNORMAL LOW (ref 3.5–5.1)
Sodium: 140 mmol/L (ref 135–145)
TCO2: 23 mmol/L (ref 22–32)

## 2024-03-17 SURGERY — CARDIOVERSION (CATH LAB)
Anesthesia: General

## 2024-03-17 MED ORDER — LIDOCAINE 2% (20 MG/ML) 5 ML SYRINGE
INTRAMUSCULAR | Status: DC | PRN
  Administered 2024-03-17: 100 mg via INTRAVENOUS

## 2024-03-17 MED ORDER — POTASSIUM CHLORIDE CRYS ER 20 MEQ PO TBCR
EXTENDED_RELEASE_TABLET | ORAL | Status: AC
  Filled 2024-03-17: qty 3

## 2024-03-17 MED ORDER — SODIUM CHLORIDE 0.9 % IV SOLN: INTRAVENOUS | Status: DC

## 2024-03-17 MED ORDER — APIXABAN 5 MG PO TABS: 5.0000 mg | ORAL_TABLET | Freq: Two times a day (BID) | ORAL | 1 refills | Status: DC

## 2024-03-17 MED ORDER — POTASSIUM CHLORIDE CRYS ER 20 MEQ PO TBCR
60.0000 meq | EXTENDED_RELEASE_TABLET | Freq: Once | ORAL | Status: AC
  Administered 2024-03-17: 60 meq via ORAL

## 2024-03-17 MED ORDER — PROPOFOL 10 MG/ML IV BOLUS
INTRAVENOUS | Status: DC | PRN
  Administered 2024-03-17: 60 mg via INTRAVENOUS
  Administered 2024-03-17 (×2): 30 mg via INTRAVENOUS

## 2024-03-17 SURGICAL SUPPLY — 1 items: PAD DEFIB RADIO PHYSIO CONN (PAD) ×1 IMPLANT

## 2024-03-17 NOTE — Telephone Encounter (Signed)
 PAP: Patient assistance application for Eliquis through [employer] ([employer]) has been mailed to pt's home address on file. Provider portion of application will be faxed to provider's office.

## 2024-03-17 NOTE — Discharge Instructions (Signed)

## 2024-03-17 NOTE — Anesthesia Preprocedure Evaluation (Addendum)
 Anesthesia Evaluation  Patient identified by MRN, date of birth, ID band Patient awake    Reviewed: Allergy & Precautions, NPO status , Patient's Chart, lab work & pertinent test results  Airway Mallampati: III  TM Distance: >3 FB Neck ROM: Full    Dental no notable dental hx. (+) Teeth Intact, Dental Advisory Given   Pulmonary shortness of breath, sleep apnea (BiPAP) , former smoker   Pulmonary exam normal breath sounds clear to auscultation       Cardiovascular hypertension, Normal cardiovascular exam+ dysrhythmias Atrial Fibrillation  Rhythm:Irregular Rate:Normal  TTE 2025  1. Left ventricular ejection fraction, by estimation, is 60 to 65%. The  left ventricle has normal function. The left ventricle has no regional  wall motion abnormalities. Left ventricular diastolic parameters are  indeterminate.   2. Right ventricular systolic function is normal. The right ventricular  size is normal.   3. Right atrial size was mild to moderately dilated.   4. The mitral valve is normal in structure. No evidence of mitral valve  regurgitation. No evidence of mitral stenosis.   5. The aortic valve is normal in structure. Aortic valve regurgitation is  not visualized. No aortic stenosis is present.   6. The inferior vena cava is normal in size with greater than 50%  respiratory variability, suggesting right atrial pressure of 3 mmHg.     Neuro/Psych negative neurological ROS  negative psych ROS   GI/Hepatic Neg liver ROS,GERD  ,,  Endo/Other  diabetes  Class 3 obesity (BMI 42)  Renal/GU Renal InsufficiencyRenal disease  negative genitourinary   Musculoskeletal negative musculoskeletal ROS (+)    Abdominal   Peds  Hematology negative hematology ROS (+)   Anesthesia Other Findings   Reproductive/Obstetrics                             Anesthesia Physical Anesthesia Plan  ASA: 3  Anesthesia Plan:  General   Post-op Pain Management:    Induction: Intravenous  PONV Risk Score and Plan: Propofol  infusion and Treatment may vary due to age or medical condition  Airway Management Planned: Natural Airway  Additional Equipment:   Intra-op Plan:   Post-operative Plan:   Informed Consent: I have reviewed the patients History and Physical, chart, labs and discussed the procedure including the risks, benefits and alternatives for the proposed anesthesia with the patient or authorized representative who has indicated his/her understanding and acceptance.     Dental advisory given  Plan Discussed with: CRNA  Anesthesia Plan Comments:        Anesthesia Quick Evaluation

## 2024-03-17 NOTE — Anesthesia Postprocedure Evaluation (Signed)
 Anesthesia Post Note  Patient: Tracy Bennett  Procedure(s) Performed: CARDIOVERSION     Patient location during evaluation: Cath Lab Anesthesia Type: General Level of consciousness: awake and alert Pain management: pain level controlled Vital Signs Assessment: post-procedure vital signs reviewed and stable Respiratory status: spontaneous breathing, nonlabored ventilation, respiratory function stable and patient connected to nasal cannula oxygen Cardiovascular status: blood pressure returned to baseline and stable Postop Assessment: no apparent nausea or vomiting Anesthetic complications: no  No notable events documented.  Last Vitals:  Vitals:   03/17/24 0835 03/17/24 0840  BP:  (!) 131/91  Pulse: 82 99  Resp: 20 13  Temp:    SpO2: 94% 94%    Last Pain:  Vitals:   03/17/24 0840  TempSrc:   PainSc: 0-No pain                 Tracy Bennett

## 2024-03-17 NOTE — CV Procedure (Signed)
 Procedure: Electrical Cardioversion Indications:  Atrial Fibrillation  Procedure Details:  Consent: Risks of procedure as well as the alternatives and risks of each were explained to the (patient/caregiver).  Consent for procedure obtained.  Time Out: Verified patient identification, verified procedure, site/side was marked, verified correct patient position, special equipment/implants available, medications/allergies/relevent history reviewed, required imaging and test results available. PERFORMED.  Patient placed on cardiac monitor, pulse oximetry, supplemental oxygen as necessary.  Sedation given:  propofol  per anesthesia Pacer pads placed anterior and posterior chest.  Cardioverted  4  time(s).  Cardioversion with synchronized biphasic 200, 300, 360 x 2 J shock. Unsuccessful cardioversion  Evaluation: Findings: Post procedure EKG shows: Atrial Fibrillation Complications: None Patient did tolerate procedure well.  Time Spent Directly with the Patient:  20 minutes   Tracy Bennett A Fauna Neuner 03/17/2024, 8:26 AM

## 2024-03-17 NOTE — Transfer of Care (Signed)
 Immediate Anesthesia Transfer of Care Note  Patient: Tracy Bennett  Procedure(s) Performed: CARDIOVERSION  Patient Location: PACU and Cath Lab  Anesthesia Type:MAC  Level of Consciousness: awake, alert , oriented, and drowsy  Airway & Oxygen Therapy: Patient Spontanous Breathing  Post-op Assessment: Report given to RN  Post vital signs: Reviewed and stable  Last Vitals:  Vitals Value Taken Time  BP 132/85 03/17/24 0830  Temp    Pulse 96 03/17/24 0836  Resp 16 03/17/24 0836  SpO2 93 % 03/17/24 0836  Vitals shown include unfiled device data.  Last Pain:  Vitals:   03/17/24 0729  TempSrc:   PainSc: 0-No pain         Complications: No notable events documented.

## 2024-03-17 NOTE — Telephone Encounter (Signed)
 Per Dr Miller Allis note below - Eliquis  5mg  - 1 tablet by mouth twice daily sent to pharmacy as requested and will forward to Patient Assistance.  this pt only has 1 tab of eliquis  left, could you call in his renewing script to pick up today at [facility] in [place]? he also is struggling with the cost, perhaps could we look into patient assistance for him? Acharya, Gayatri A, MD

## 2024-03-17 NOTE — H&P (Signed)
 H&P update: Patient presents for DCCV after 4 weeks of AC per Dr. Rodolfo Clan (his note below for reference.) GEN: No acute distress.   Neck: No JVD Cardiac: iRRR, no murmurs, rubs, or gallops.  Respiratory: Clear to auscultation bilaterally. GI: Soft, nontender, non-distended  MS: No edema; No deformity. Neuro:  Nonfocal  Psych: Normal affect   AP: Plan for cardioversion. Replete potassium prior to cardioversion.  Risks, benefits and alternatives of direct current cardioversion reviewed including potential for post-cardioversion rhythms, especially life-threatening arrhythmias (ventricular tachycardia and fibrillation, profound bradycardia). Major complications may include serious or fatal arrhythmias, myocardial damage, and acute pulmonary edema; minor complications include skin burns and transient hypotension. Benefits include restoration of sinus rhythm. Alternatives to treatment were discussed, questions were answered. Patient is willing to proceed.    ----------------------------------------------------------------------------------      Patient Care Team: Cydney Draft, MD as PCP - General (Family Medicine) Levie Ream, MD as Referring Physician (Gastroenterology) Verona Goodwill, MD as Consulting Physician (Cardiology) Rowan Cooter, MD as Referring Physician (Urology) Loyd Ruiz, MD as Referring Physician (Ophthalmology)  CC PVCs  HPI  Tracy Bennett is a 78 y.o. male Seen in follow-up for PVCs -RVOT. He had no associated symptoms.  Also hypertension  There was also a question at the initial consultation regarding long QT syndrome for which I found no evidence.   The patient denies chest pain, shortness of breat, nocturnal dyspnea, orthopnea or peripheral edema (See Below) .  There have been no palpitations, lightheadedness or syncope .   For Mounjaro  has lost 60 pounds.  Breathing is much improved.  Still uses BiPAP  Perhaps he heard the  term atrial fibrillation 6 years ago when he had some eye surgery and they had to hold off on it.  Not sure.   Thromboembolic risk factors ( age -62, HTN-1, DM-1) for a CHADSVASc Score of >=4   DATE TEST EF   11/16 Echo    60 %   11/17 Echo   60-65 %           Date Cr K LDL  9/16   75  12/18 1.24 4.1   10/21 1.46 4.5   11/22 1.18 3.7      Past Medical History:  Diagnosis Date   Allergy    Arthritis    Community acquired pneumonia of left lower lobe of lung 05/15/2018   Dyspnea    with exertion   Dysrhythmia    frequent PVC's   Frequent urination    GERD (gastroesophageal reflux disease)    History of kidney stones    Hyperlipidemia    Hypertension    Kidney stones    x 4    Sleep apnea    BiPAP    Past Surgical History:  Procedure Laterality Date   CARPAL TUNNEL RELEASE     Right    CERVICAL FUSION     cervical fusion times 2   EYE SURGERY     FOOT TENDON SURGERY  11/14/2011   Dr. Julio Ohm   FRACTURE SURGERY     HERNIA REPAIR     left knee surgery     ACL repair    LIPOMA EXCISION Bilateral    on back   MASS EXCISION N/A 01/24/2018   Procedure: EXCISION OF BACK MASS;  Surgeon: Sim Dryer, MD;  Location: [facility] OR;  Service: General;  Laterality: N/A;   SPINE SURGERY     UMBILICAL HERNIA REPAIR  Current Facility-Administered Medications  Medication Dose Route Frequency Provider Last Rate Last Admin   0.9 %  sodium chloride  infusion   Intravenous Continuous Verona Goodwill, MD        Allergies  Allergen Reactions   Amoxicillin Swelling    Tongue sores   Iodinated Contrast Media Shortness Of Breath and Rash    Patient gets hot and sweating   Lisinopril Cough and Rash    Other reaction(s): Cough      Review of Systems negative except from HPI and PMH  Physical Exam BP 126/77   Pulse 98   Temp 97.9 F (36.6 C) (Temporal)   Resp 14   SpO2 96%  Well developed and nourished in no acute distress HENT normal Neck supple with JVP-  flat   Clear Irregular irregular rate and rhythm with controlled ventricular response, no murmurs or gallops Abd-soft with active BS No Clubbing cyanosis edema Skin-warm and dry A & Oriented  Grossly normal sensory and motor function  ECG atrial fibrillation at 99  -/08/31  Interval  Assessment and  Plan  Atrial fibrillation  PVCs-RVOT  ECG abnormal chronic T wave inversions    Hypertension and hypertensive heart disease  HFpEF  Sinus Bradycardia   Morbidly obese  Persistent atrial fibrillation newly identified.  No clear symptoms, but based on [facility] AF-Net we will pursue sinus rhythm.  DC cardioversion   as he is unaware of his atrial fibrillation I have advised him to get an [employer] monitor to use it every day or every other day to look for recurrences of A-fib.   Will begin him on anticoagulation with Eliquis  5 twice daily based on his last creatinine and will recheck that today.  Anticipate cardioversion in 4 weeks.  Will repeat the echocardiogram.  Failed to order TSH but will do so.  Right now we will continue him on his SGLT2 for his HFpEF.  Blood pressure is well-controlled and they have been able to down titrate some of his medications with his weight loss.

## 2024-03-22 ENCOUNTER — Other Ambulatory Visit: Payer: Self-pay | Admitting: Family Medicine

## 2024-03-22 DIAGNOSIS — E118 Type 2 diabetes mellitus with unspecified complications: Secondary | ICD-10-CM

## 2024-03-26 ENCOUNTER — Telehealth: Payer: Self-pay

## 2024-03-26 ENCOUNTER — Other Ambulatory Visit (HOSPITAL_COMMUNITY): Payer: Self-pay

## 2024-03-26 NOTE — Telephone Encounter (Signed)
 Pharmacy Patient Advocate Encounter   Received notification from Patient Pharmacy that prior authorization for Accu-Chek Guide Test Strips is required/requested.   Insurance verification completed.   The patient is insured through [employer] .   Per test claim: Medication is not eligible for pharmacy benefits and must be billed through medical insurance. As our team only handles pharmacy related prior auths, medical PA's must be submitted by the clinic. Thank you This must be processed through patients MEDICARE Part B (red white and blue card). Called the pharmacy to confirm.

## 2024-04-01 ENCOUNTER — Encounter: Payer: Self-pay | Admitting: Family Medicine

## 2024-04-01 ENCOUNTER — Ambulatory Visit (INDEPENDENT_AMBULATORY_CARE_PROVIDER_SITE_OTHER): Payer: Medicare Other | Admitting: Family Medicine

## 2024-04-01 VITALS — BP 124/82 | HR 98 | Ht 69.0 in | Wt 288.1 lb

## 2024-04-01 DIAGNOSIS — N1831 Chronic kidney disease, stage 3a: Secondary | ICD-10-CM

## 2024-04-01 DIAGNOSIS — E118 Type 2 diabetes mellitus with unspecified complications: Secondary | ICD-10-CM

## 2024-04-01 DIAGNOSIS — Z7985 Long-term (current) use of injectable non-insulin antidiabetic drugs: Secondary | ICD-10-CM

## 2024-04-01 DIAGNOSIS — Z7984 Long term (current) use of oral hypoglycemic drugs: Secondary | ICD-10-CM

## 2024-04-01 DIAGNOSIS — I5030 Unspecified diastolic (congestive) heart failure: Secondary | ICD-10-CM

## 2024-04-01 LAB — POCT GLYCOSYLATED HEMOGLOBIN (HGB A1C): Hemoglobin A1C: 5.4 % (ref 4.0–5.6)

## 2024-04-01 MED ORDER — TIRZEPATIDE 15 MG/0.5ML ~~LOC~~ SOAJ: 15.0000 mg | SUBCUTANEOUS | 3 refills | Status: AC

## 2024-04-01 NOTE — Progress Notes (Signed)
 Established Patient Office Visit  Subjective  Patient ID: Tracy Bennett, male    DOB: 1946/09/23  Age: 78 y.o. MRN: [id number]  Chief Complaint  Patient presents with   Diabetes    HPI\\  Diabetes - no hypoglycemic events. No wounds or sores that are not healing well. No increased thirst or urination. Checking glucose at home. Taking medications as prescribed without any side effects. Feels like would like to lose more weight. Feels like has plateaued.    Heart failure - no sign of volume overload.  Had a failed ablation on 03/17/24.      ROS    Objective:     BP 124/82   Pulse 98   Ht 5\' 9"  (1.753 m)   Wt 288 lb 1.9 oz (130.7 kg)   SpO2 96%   BMI 42.55 kg/m    Physical Exam Vitals and nursing note reviewed.  Constitutional:      Appearance: Normal appearance.  HENT:     Head: Normocephalic and atraumatic.  Eyes:     Conjunctiva/sclera: Conjunctivae normal.  Cardiovascular:     Rate and Rhythm: Normal rate. Rhythm irregular.  Pulmonary:     Effort: Pulmonary effort is normal.     Breath sounds: Normal breath sounds.  Skin:    General: Skin is warm and dry.  Neurological:     Mental Status: He is alert.  Psychiatric:        Mood and Affect: Mood normal.      Results for orders placed or performed in visit on 04/01/24  POCT HgB A1C  Result Value Ref Range   Hemoglobin A1C 5.4 4.0 - 5.6 %   HbA1c POC (<> result, manual entry)     HbA1c, POC (prediabetic range)     HbA1c, POC (controlled diabetic range)        The ASCVD Risk score (Arnett DK, et al., 2019) failed to calculate for the following reasons:   The valid total cholesterol range is 130 to 320 mg/dL    Assessment & Plan:   Problem List Items Addressed This Visit       Cardiovascular and Mediastinum   (HFpEF) heart failure with preserved ejection fraction (HCC) - Primary   He really doesn't need the Farxiga  for glucose control but he does have heart failure and I really encouraged  him to talk with the cardiologist about it he has been taking his Farxiga  every other day but he has now met his deductible for the year so he could easily go back up to daily or even go up to 10 mg if they felt like that would be more beneficial for his heart failure.  His weight is down to 288 pounds but he does feel like he is plateaued a little bit lately.  For cardiac benefit I think he should take the Farxiga  daily but asked him to speak with his Cardiologist.          Endocrine   Controlled diabetes mellitus type 2 with complications (HCC)   A1C looks phenomenal today though he would like to bump up the Mounjaro  to continue to benefit from appetite control.  New prescription sent to pharmacy for 15 mg encouraged him to also work on increasing his activity and exercise level.  And continue to listen to those signals when he feels like he is starting to get full.      Relevant Medications   tirzepatide  (MOUNJARO ) 15 MG/0.5ML Pen   Other Relevant Orders  POCT HgB A1C (Completed)     Genitourinary   CKD (chronic kidney disease) stage 3, GFR 30-59 ml/min (HCC)   Will follow Q 6 months.         Return in about 4 months (around 08/02/2024) for Diabetes follow-up.    Duaine German, MD

## 2024-04-01 NOTE — Assessment & Plan Note (Addendum)
 He really doesn't need the Farxiga  for glucose control but he does have heart failure and I really encouraged him to talk with the cardiologist about it he has been taking his Farxiga  every other day but he has now met his deductible for the year so he could easily go back up to daily or even go up to 10 mg if they felt like that would be more beneficial for his heart failure.  His weight is down to 288 pounds but he does feel like he is plateaued a little bit lately.  For cardiac benefit I think he should take the Farxiga  daily but asked him to speak with his Cardiologist.

## 2024-04-01 NOTE — Assessment & Plan Note (Addendum)
 A1C looks phenomenal today though he would like to bump up the Mounjaro  to continue to benefit from appetite control.  New prescription sent to pharmacy for 15 mg encouraged him to also work on increasing his activity and exercise level.  And continue to listen to those signals when he feels like he is starting to get full.

## 2024-04-01 NOTE — Assessment & Plan Note (Signed)
 Will follow Q 6 months.

## 2024-04-02 NOTE — Telephone Encounter (Signed)
 Left VM

## 2024-04-14 ENCOUNTER — Ambulatory Visit (HOSPITAL_COMMUNITY)
Admission: RE | Admit: 2024-04-14 | Discharge: 2024-04-14 | Disposition: A | Discharge status: Home / Self Care | Source: Ambulatory Visit | Attending: Physician Assistant | Admitting: Physician Assistant

## 2024-04-14 ENCOUNTER — Encounter (HOSPITAL_COMMUNITY): Payer: Self-pay | Admitting: Physician Assistant

## 2024-04-14 VITALS — BP 140/88 | HR 83 | Ht 69.0 in | Wt 288.6 lb

## 2024-04-14 DIAGNOSIS — D6869 Other thrombophilia: Secondary | ICD-10-CM | POA: Insufficient documentation

## 2024-04-14 DIAGNOSIS — I480 Paroxysmal atrial fibrillation: Secondary | ICD-10-CM | POA: Diagnosis not present

## 2024-04-14 DIAGNOSIS — I4819 Other persistent atrial fibrillation: Secondary | ICD-10-CM | POA: Insufficient documentation

## 2024-04-14 NOTE — Progress Notes (Signed)
 Primary Care Physician: Cydney Draft, MD Primary Cardiologist: None Electrophysiologist: Richardo Chandler, MD  Referring Physician: Dr Ophelia Billet Duffey is a 78 y.o. male with a history of HTN, HFpEF, PVCs, DM, HLD, OSA, atrial fibrillation who presents for follow up in the [facility].  The patient was initially diagnosed with atrial fibrillation 02/11/24 at a visit with Dr Rodolfo Clan. Patient was unaware of his arrhythmia. He was started on Eliquis  for stroke prevention. Patient underwent DCCV on 03/17/24 which was unsuccessful after 4 shocks.    Patient presents today for follow up for atrial fibrillation. He remains in rate controlled afib. He denies any awareness of his afib. No bleeding issues on anticoagulation. He is compliant with his BIPAP.   Today, he denies symptoms of palpitations, chest pain, shortness of breath, orthopnea, PND, lower extremity edema, dizziness, presyncope, syncope, bleeding, or neurologic sequela. The patient is tolerating medications without difficulties and is otherwise without complaint today.    Atrial Fibrillation Risk Factors:  he does have symptoms or diagnosis of sleep apnea. he does not have a history of rheumatic fever.   Atrial Fibrillation Management history:  Previous antiarrhythmic drugs: none Previous cardioversions: 03/17/24 unsuccessful  Previous ablations: none Anticoagulation history: Eliquis   ROS- All systems are reviewed and negative except as per the HPI above.  Past Medical History:  Diagnosis Date   Allergy    Arthritis    Community acquired pneumonia of left lower lobe of lung 05/15/2018   Dyspnea    with exertion   Dysrhythmia    frequent PVC's   Frequent urination    GERD (gastroesophageal reflux disease)    History of kidney stones    Hyperlipidemia    Hypertension    Kidney stones    x 4    Sleep apnea    BiPAP    Current Outpatient Medications  Medication Sig Dispense  Refill   Accu-Chek FastClix Lancets MISC Dx DM E11.9 Check fasting blood sugar daily and after largest meal of the day a couple time a week. 102 each 11   apixaban  (ELIQUIS ) 5 MG TABS tablet Take 1 tablet (5 mg total) by mouth 2 (two) times daily. 180 tablet 1   Blood Glucose Monitoring Suppl DEVI 1 each by Does not apply route in the morning, at noon, and at bedtime. E11.9, May substitute to any manufacturer covered by patient's insurance. 1 each 0   dapagliflozin  propanediol (FARXIGA ) 5 MG TABS tablet TAKE 1 TABLET(5 MG) BY MOUTH DAILY BEFORE BREAKFAST 90 tablet 3   finasteride  (PROSCAR ) 5 MG tablet Take 1 tablet (5 mg total) by mouth daily. (Patient taking differently: Take 5 mg by mouth at bedtime.) 30 tablet 1   Fluocinolone Acetonide 0.01 % OIL Place 1 application  in ear(s) daily as needed (Itching ear/Dry).     furosemide  (LASIX ) 40 MG tablet Take 1 tablet (40 mg total) by mouth every other day. 45 tablet 3   glucose blood (ACCU-CHEK GUIDE TEST) test strip TEST BLOOD SUGAR MORNING, NOON AND AT BEDTIME TIME 300 strip 11   HYDROcodone -acetaminophen  (NORCO) 7.5-325 MG tablet Take 1 tablet by mouth every 6 (six) hours as needed (Back pain).     Lancet Device MISC 1 each by Does not apply route daily as needed. May substitute to any manufacturer covered by patient's insurance. 1 each PRN   loratadine (CLARITIN) 10 MG tablet Take 10 mg by mouth daily.     Misc. Devices MISC New  bipap machine at current settings.     naproxen  (NAPROSYN ) 500 MG tablet Take 1 tablet (500 mg total) by mouth 2 (two) times daily as needed. (Patient taking differently: Take 500 mg by mouth at bedtime as needed for mild pain (pain score 1-3) or moderate pain (pain score 4-6) (Lower back).) 60 tablet 1   oxybutynin (DITROPAN-XL) 10 MG 24 hr tablet Take 10 mg by mouth at bedtime.  3   pantoprazole  (PROTONIX ) 40 MG tablet TAKE 1 TABLET BY MOUTH EVERY DAY 90 tablet 3   simvastatin  (ZOCOR ) 40 MG tablet TAKE 1 TABLET BY MOUTH  DAILY AT 6 PM. 90 tablet 3   tamsulosin (FLOMAX) 0.4 MG CAPS capsule Take 0.4 mg by mouth at bedtime.     tirzepatide  (MOUNJARO ) 15 MG/0.5ML Pen Inject 15 mg into the skin once a week. 6 mL 3   valsartan -hydrochlorothiazide  (DIOVAN -HCT) 80-12.5 MG tablet TAKE 1 TABLET BY MOUTH DAILY 90 tablet 0   No current facility-administered medications for this encounter.    Physical Exam: BP (!) 140/88   Pulse 83   Ht 5\' 9"  (1.753 m)   Wt 130.9 kg   BMI 42.62 kg/m   GEN: Well nourished, well developed in no acute distress CARDIAC: Irregularly irregular rate and rhythm, no murmurs, rubs, gallops RESPIRATORY:  Clear to auscultation without rales, wheezing or rhonchi  ABDOMEN: Soft, non-tender, non-distended EXTREMITIES:  No edema; No deformity   Wt Readings from Last 3 Encounters:  04/14/24 130.9 kg  04/01/24 130.7 kg  03/14/24 129.1 kg     EKG today demonstrates  Afib, NST Vent. rate 83 BPM PR interval * ms QRS duration 78 ms QT/QTcB 318/373 ms   Echo 02/28/24 demonstrated   1. Left ventricular ejection fraction, by estimation, is 60 to 65%. The  left ventricle has normal function. The left ventricle has no regional  wall motion abnormalities. Left ventricular diastolic parameters are  indeterminate.   2. Right ventricular systolic function is normal. The right ventricular  size is normal.   3. Right atrial size was mild to moderately dilated.   4. The mitral valve is normal in structure. No evidence of mitral valve  regurgitation. No evidence of mitral stenosis.   5. The aortic valve is normal in structure. Aortic valve regurgitation is  not visualized. No aortic stenosis is present.   6. The inferior vena cava is normal in size with greater than 50%  respiratory variability, suggesting right atrial pressure of 3 mmHg.   Comparison(s): Echocardiogram done 10/17/16 showed an EF of 60-65%.    CHA2DS2-VASc Score = 5  The patient's score is based upon: CHF History: 1 HTN  History: 1 Diabetes History: 1 Stroke History: 0 Vascular Disease History: 0 Age Score: 2 Gender Score: 0       ASSESSMENT AND PLAN: Persistent Atrial Fibrillation (ICD10:  I48.19) The patient's CHA2DS2-VASc score is 5, indicating a 7.2% annual risk of stroke.   S/p unsuccessful DCCV 03/17/24 We discussed rate vs rhythm control (flecainide, dofetilide, amiodarone, ablation). Patient would like to avoid more long term medication if possible. He is agreeable to discussing ablation with EP. His BMI is borderline but he has lost >60 lbs with Mounjaro .  Continue Eliquis  5 mg BID  Secondary Hypercoagulable State (ICD10:  D68.69) The patient is at significant risk for stroke/thromboembolism based upon his CHA2DS2-VASc Score of 5.  Continue Apixaban  (Eliquis ).   Chronic HFpEF EF 60-65% Fluid status appears stable today  HTN Stable on current regimen  OSA  Encouraged nightly BiPap  Obesity Body mass index is 42.62 kg/m.  Encouraged lifestyle modification    Follow up with EP to discuss ablation vs rate control.        Myrtha Ates PA-C [facility] [address] [place], [place] [address] [phone]

## 2024-04-17 DIAGNOSIS — E119 Type 2 diabetes mellitus without complications: Secondary | ICD-10-CM | POA: Diagnosis not present

## 2024-04-25 NOTE — Telephone Encounter (Signed)
 Patient hit $2000 ceiling-

## 2024-04-29 ENCOUNTER — Ambulatory Visit: Attending: Cardiology | Admitting: Cardiology

## 2024-04-29 ENCOUNTER — Encounter: Payer: Self-pay | Admitting: Cardiology

## 2024-04-29 ENCOUNTER — Other Ambulatory Visit: Payer: Self-pay

## 2024-04-29 VITALS — BP 115/71 | HR 70 | Ht 69.0 in | Wt 288.4 lb

## 2024-04-29 DIAGNOSIS — I10 Essential (primary) hypertension: Secondary | ICD-10-CM

## 2024-04-29 DIAGNOSIS — I5032 Chronic diastolic (congestive) heart failure: Secondary | ICD-10-CM | POA: Diagnosis not present

## 2024-04-29 DIAGNOSIS — I480 Paroxysmal atrial fibrillation: Secondary | ICD-10-CM

## 2024-04-29 DIAGNOSIS — D6869 Other thrombophilia: Secondary | ICD-10-CM | POA: Diagnosis not present

## 2024-04-29 DIAGNOSIS — I4819 Other persistent atrial fibrillation: Secondary | ICD-10-CM

## 2024-04-29 DIAGNOSIS — G4733 Obstructive sleep apnea (adult) (pediatric): Secondary | ICD-10-CM

## 2024-04-29 MED ORDER — AMIODARONE HCL 200 MG PO TABS: ORAL_TABLET | ORAL | 3 refills | Status: DC

## 2024-04-29 NOTE — Patient Instructions (Signed)
 Medication Instructions:  Your physician has recommended you make the following change in your medication:  1) START Amiodarone  - take 2 tablets (400 mg total) TWICE a day for 2 weeks, then  - take 1 tablet (200 mg total) TWICE a day   *If you need a refill on your cardiac medications before your next appointment, please call your pharmacy*  Lab Work: TODAY: BMET and CBC  Testing/Procedures: Cardioversion Your physician has recommended that you have a Cardioversion (DCCV). Electrical Cardioversion uses a jolt of electricity to your heart either through paddles or wired patches attached to your chest. This is a controlled, usually prescheduled, procedure. Defibrillation is done under light anesthesia in the hospital, and you usually go home the day of the procedure. This is done to get your heart back into a normal rhythm. You are not awake for the procedure. Please see the instruction sheet given to you today.  Follow-Up: At [facility], you and your health needs are our priority.  As part of our continuing mission to provide you with exceptional heart care, we have created designated Provider Care Teams.  These Care Teams include your primary Cardiologist (physician) and Advanced Practice Providers (APPs -  Physician Assistants and Nurse Practitioners) who all work together to provide you with the care you need, when you need it.   Your next appointment:   4 weeks with Dr. Daneil Dunker

## 2024-04-29 NOTE — H&P (View-Only) (Signed)
 Electrophysiology Office Note:   Date:  04/30/2024  ID:  Tracy Bennett, DOB [birth year], MRN [id number]  Primary Cardiologist: None Electrophysiologist: Ardeen Kohler, MD      History of Present Illness:   Tracy Bennett is a 78 y.o. male with h/o HTN, chronic diastolic heart failure, PVCs, DM, HLD, OSA, atrial fibrillation who is being seen today for evaluation for catheter ablation.  Discussed the use of AI scribe software for clinical note transcription with the patient, who gave verbal consent to proceed.  History of Present Illness Persistent atrial fibrillation was first identified during an EKG in March 2025. We do not know when exactly AF started. He has not had any episodes of normal rhythm on his personal Kardia mobile device. No rapid heart rate or palpitations, but he has increased fatigue and requires more frequent breaks during physical activities compared to previous years.  He has a history of high blood sugar, with a notable episode where his blood sugar reached 333 mg/dL approximately a year ago. Since then, he has managed to lower his blood sugar levels to the 90s with the help of Mounjaro  injections and Farxiga . He has also lost about 60 pounds, which he attributes to these medications and lifestyle changes.  His past medical history includes sleep apnea for which he uses a BiPAP machine nightly. He reports significant improvement in his sleep quality since starting the BiPAP in 2015. He also recalls an episode in 2015 where he experienced skipped heartbeats during a dental procedure, which resolved spontaneously.  He is currently taking Eliquis  to reduce the risk of stroke associated with atrial fibrillation. He has not experienced any bleeding issues while on this medication.  In terms of social history, he remains as active as he can be, engaging in activities such as mowing, tree trimming, and other yard work, although he notes a decrease in stamina and increased fatigue  compared to his younger years.   Review of systems complete and found to be negative unless listed in HPI.   EP Information / Studies Reviewed:    EKG is not ordered today. EKG from 04/14/24 reviewed which showed AF.      Echo 02/28/24:   1. Left ventricular ejection fraction, by estimation, is 60 to 65%. The  left ventricle has normal function. The left ventricle has no regional  wall motion abnormalities. Left ventricular diastolic parameters are  indeterminate.   2. Right ventricular systolic function is normal. The right ventricular  size is normal.   3. Right atrial size was mild to moderately dilated.   4. The mitral valve is normal in structure. No evidence of mitral valve  regurgitation. No evidence of mitral stenosis.   5. The aortic valve is normal in structure. Aortic valve regurgitation is  not visualized. No aortic stenosis is present.   6. The inferior vena cava is normal in size with greater than 50%  respiratory variability, suggesting right atrial pressure of 3 mmHg.    Risk Assessment/Calculations:    CHA2DS2-VASc Score = 5   This indicates a 7.2% annual risk of stroke. The patient's score is based upon: CHF History: 1 HTN History: 1 Diabetes History: 1 Stroke History: 0 Vascular Disease History: 0 Age Score: 2 Gender Score: 0             Physical Exam:   VS:  BP 115/71   Pulse 70   Ht 5\' 9"  (1.753 m)   Wt 288 lb 6.4 oz (130.8 kg)   SpO2  94%   BMI 42.59 kg/m    Wt Readings from Last 3 Encounters:  04/29/24 288 lb 6.4 oz (130.8 kg)  04/14/24 288 lb 9.6 oz (130.9 kg)  04/01/24 288 lb 1.9 oz (130.7 kg)     GEN: Well nourished, well developed in no acute distress NECK: No JVD CARDIAC: Normal rate, irregular rhythm RESPIRATORY:  Clear to auscultation without rales, wheezing or rhonchi  ABDOMEN: Soft, non-distended EXTREMITIES:  No edema; No deformity   ASSESSMENT AND PLAN:    #. Persistent atrial fibrillation, symptomatic: History of  diastolic heart failure. We will attempt a rhythm control strategy to assess for improvement in symptoms. Unclear duration. First diagnosed in March of 2025 but I suspect he has been in AF much longer.  #. Secondary hypercoagulable state due to AF:  - Sent for DCCV on 03/17/24 which was unsuccessful. We will start amiodarone 400mg  BID for 2 weeks then retry cardioversion. He will return to see me in 4 weeks to reassess symptoms. His best long term option may be ablation but would like to prove he can maintain sinus rhythm for at least short period of time.  - Continue Eliquis  5mg  BID.  #. Chronic diastolic heart failure: Well compensated today. - Rhythm control as above.  - Continue follow up with general cardiology.   #Hypertension -At goal today.  Recommend checking blood pressures 1-2 times per week at home and recording the values.  Recommend bringing these recordings to the primary care physician.  #. OSA:  -Encouraged BiPAP use.   Follow up with Dr. Daneil Dunker in 4 weeks  Signed, Ardeen Kohler, MD

## 2024-04-29 NOTE — Progress Notes (Unsigned)
 Electrophysiology Office Note:   Date:  04/30/2024  ID:  Tracy Bennett, DOB [birth year], MRN [id number]  Primary Cardiologist: None Electrophysiologist: Ardeen Kohler, MD      History of Present Illness:   Tracy Bennett is a 78 y.o. male with h/o HTN, chronic diastolic heart failure, PVCs, DM, HLD, OSA, atrial fibrillation who is being seen today for evaluation for catheter ablation.  Discussed the use of AI scribe software for clinical note transcription with the patient, who gave verbal consent to proceed.  History of Present Illness Persistent atrial fibrillation was first identified during an EKG in March 2025. We do not know when exactly AF started. He has not had any episodes of normal rhythm on his personal Kardia mobile device. No rapid heart rate or palpitations, but he has increased fatigue and requires more frequent breaks during physical activities compared to previous years.  He has a history of high blood sugar, with a notable episode where his blood sugar reached 333 mg/dL approximately a year ago. Since then, he has managed to lower his blood sugar levels to the 90s with the help of Mounjaro  injections and Farxiga . He has also lost about 60 pounds, which he attributes to these medications and lifestyle changes.  His past medical history includes sleep apnea for which he uses a BiPAP machine nightly. He reports significant improvement in his sleep quality since starting the BiPAP in 2015. He also recalls an episode in 2015 where he experienced skipped heartbeats during a dental procedure, which resolved spontaneously.  He is currently taking Eliquis  to reduce the risk of stroke associated with atrial fibrillation. He has not experienced any bleeding issues while on this medication.  In terms of social history, he remains as active as he can be, engaging in activities such as mowing, tree trimming, and other yard work, although he notes a decrease in stamina and increased fatigue  compared to his younger years.   Review of systems complete and found to be negative unless listed in HPI.   EP Information / Studies Reviewed:    EKG is not ordered today. EKG from 04/14/24 reviewed which showed AF.      Echo 02/28/24:   1. Left ventricular ejection fraction, by estimation, is 60 to 65%. The  left ventricle has normal function. The left ventricle has no regional  wall motion abnormalities. Left ventricular diastolic parameters are  indeterminate.   2. Right ventricular systolic function is normal. The right ventricular  size is normal.   3. Right atrial size was mild to moderately dilated.   4. The mitral valve is normal in structure. No evidence of mitral valve  regurgitation. No evidence of mitral stenosis.   5. The aortic valve is normal in structure. Aortic valve regurgitation is  not visualized. No aortic stenosis is present.   6. The inferior vena cava is normal in size with greater than 50%  respiratory variability, suggesting right atrial pressure of 3 mmHg.    Risk Assessment/Calculations:    CHA2DS2-VASc Score = 5   This indicates a 7.2% annual risk of stroke. The patient's score is based upon: CHF History: 1 HTN History: 1 Diabetes History: 1 Stroke History: 0 Vascular Disease History: 0 Age Score: 2 Gender Score: 0             Physical Exam:   VS:  BP 115/71   Pulse 70   Ht 5\' 9"  (1.753 m)   Wt 288 lb 6.4 oz (130.8 kg)   SpO2  94%   BMI 42.59 kg/m    Wt Readings from Last 3 Encounters:  04/29/24 288 lb 6.4 oz (130.8 kg)  04/14/24 288 lb 9.6 oz (130.9 kg)  04/01/24 288 lb 1.9 oz (130.7 kg)     GEN: Well nourished, well developed in no acute distress NECK: No JVD CARDIAC: Normal rate, irregular rhythm RESPIRATORY:  Clear to auscultation without rales, wheezing or rhonchi  ABDOMEN: Soft, non-distended EXTREMITIES:  No edema; No deformity   ASSESSMENT AND PLAN:    #. Persistent atrial fibrillation, symptomatic: History of  diastolic heart failure. We will attempt a rhythm control strategy to assess for improvement in symptoms. Unclear duration. First diagnosed in March of 2025 but I suspect he has been in AF much longer.  #. Secondary hypercoagulable state due to AF:  - Sent for DCCV on 03/17/24 which was unsuccessful. We will start amiodarone 400mg  BID for 2 weeks then retry cardioversion. He will return to see me in 4 weeks to reassess symptoms. His best long term option may be ablation but would like to prove he can maintain sinus rhythm for at least short period of time.  - Continue Eliquis  5mg  BID.  #. Chronic diastolic heart failure: Well compensated today. - Rhythm control as above.  - Continue follow up with general cardiology.   #Hypertension -At goal today.  Recommend checking blood pressures 1-2 times per week at home and recording the values.  Recommend bringing these recordings to the primary care physician.  #. OSA:  -Encouraged BiPAP use.   Follow up with Dr. Daneil Dunker in 4 weeks  Signed, Ardeen Kohler, MD

## 2024-04-30 LAB — CBC
Hematocrit: 49.5 % (ref 37.5–51.0)
Hemoglobin: 16.4 g/dL (ref 13.0–17.7)
MCH: 30.9 pg (ref 26.6–33.0)
MCHC: 33.1 g/dL (ref 31.5–35.7)
MCV: 93 fL (ref 79–97)
Platelets: 150 10*3/uL (ref 150–450)
RBC: 5.3 x10E6/uL (ref 4.14–5.80)
RDW: 12.1 % (ref 11.6–15.4)
WBC: 7.8 10*3/uL (ref 3.4–10.8)

## 2024-04-30 LAB — BASIC METABOLIC PANEL WITH GFR
BUN/Creatinine Ratio: 13 (ref 10–24)
BUN: 15 mg/dL (ref 8–27)
CO2: 22 mmol/L (ref 20–29)
Calcium: 9.4 mg/dL (ref 8.6–10.2)
Chloride: 100 mmol/L (ref 96–106)
Creatinine, Ser: 1.12 mg/dL (ref 0.76–1.27)
Glucose: 78 mg/dL (ref 70–99)
Potassium: 3.9 mmol/L (ref 3.5–5.2)
Sodium: 139 mmol/L (ref 134–144)
eGFR: 67 mL/min/{1.73_m2} (ref >59)

## 2024-05-04 NOTE — Pre-Procedure Instructions (Signed)
 Spoke with patient's wife Tecumseh.  He is on Mounjaro , they are aware to hold for 7 days prior to procedure. He takes on Sunday's, ok to take today, do not take next Sunday 6/15.

## 2024-05-05 ENCOUNTER — Ambulatory Visit: Payer: Self-pay

## 2024-05-13 NOTE — Progress Notes (Signed)
 Spoke to patient and instructed them to come at 0830  and to be NPO after 0000.  Medications reviewed.    Confirmed that patient will have a ride home and someone to stay with them for 24 hours after the procedure.

## 2024-05-14 ENCOUNTER — Other Ambulatory Visit: Payer: Self-pay

## 2024-05-14 ENCOUNTER — Encounter (HOSPITAL_COMMUNITY)
Admission: RE | Disposition: A | Discharge status: Home / Self Care | Payer: Self-pay | Source: Home / Self Care | Attending: Internal Medicine

## 2024-05-14 ENCOUNTER — Ambulatory Visit (HOSPITAL_COMMUNITY): Admitting: Anesthesiology

## 2024-05-14 ENCOUNTER — Encounter (HOSPITAL_COMMUNITY): Payer: Self-pay | Admitting: Internal Medicine

## 2024-05-14 ENCOUNTER — Ambulatory Visit (HOSPITAL_COMMUNITY)
Admission: RE | Admit: 2024-05-14 | Discharge: 2024-05-14 | Disposition: A | Discharge status: Home / Self Care | Attending: Internal Medicine | Admitting: Internal Medicine

## 2024-05-14 DIAGNOSIS — I4891 Unspecified atrial fibrillation: Secondary | ICD-10-CM

## 2024-05-14 DIAGNOSIS — I110 Hypertensive heart disease with heart failure: Secondary | ICD-10-CM | POA: Insufficient documentation

## 2024-05-14 DIAGNOSIS — I5030 Unspecified diastolic (congestive) heart failure: Secondary | ICD-10-CM

## 2024-05-14 DIAGNOSIS — D6869 Other thrombophilia: Secondary | ICD-10-CM | POA: Insufficient documentation

## 2024-05-14 DIAGNOSIS — I4819 Other persistent atrial fibrillation: Secondary | ICD-10-CM | POA: Insufficient documentation

## 2024-05-14 DIAGNOSIS — Z7901 Long term (current) use of anticoagulants: Secondary | ICD-10-CM | POA: Insufficient documentation

## 2024-05-14 DIAGNOSIS — E119 Type 2 diabetes mellitus without complications: Secondary | ICD-10-CM | POA: Diagnosis not present

## 2024-05-14 DIAGNOSIS — G4733 Obstructive sleep apnea (adult) (pediatric): Secondary | ICD-10-CM | POA: Insufficient documentation

## 2024-05-14 DIAGNOSIS — Z7985 Long-term (current) use of injectable non-insulin antidiabetic drugs: Secondary | ICD-10-CM | POA: Diagnosis not present

## 2024-05-14 DIAGNOSIS — N183 Chronic kidney disease, stage 3 unspecified: Secondary | ICD-10-CM | POA: Diagnosis not present

## 2024-05-14 DIAGNOSIS — I5032 Chronic diastolic (congestive) heart failure: Secondary | ICD-10-CM | POA: Insufficient documentation

## 2024-05-14 DIAGNOSIS — Z01818 Encounter for other preprocedural examination: Secondary | ICD-10-CM

## 2024-05-14 DIAGNOSIS — E1122 Type 2 diabetes mellitus with diabetic chronic kidney disease: Secondary | ICD-10-CM | POA: Diagnosis not present

## 2024-05-14 DIAGNOSIS — I130 Hypertensive heart and chronic kidney disease with heart failure and stage 1 through stage 4 chronic kidney disease, or unspecified chronic kidney disease: Secondary | ICD-10-CM

## 2024-05-14 HISTORY — PX: CARDIOVERSION: EP1203

## 2024-05-14 SURGERY — CARDIOVERSION (CATH LAB)
Anesthesia: General

## 2024-05-14 MED ORDER — LIDOCAINE 2% (20 MG/ML) 5 ML SYRINGE
INTRAMUSCULAR | Status: DC | PRN
  Administered 2024-05-14: 50 mg via INTRAVENOUS

## 2024-05-14 MED ORDER — PROPOFOL 10 MG/ML IV BOLUS
INTRAVENOUS | Status: DC | PRN
  Administered 2024-05-14: 30 mg via INTRAVENOUS
  Administered 2024-05-14: 50 mg via INTRAVENOUS

## 2024-05-14 SURGICAL SUPPLY — 1 items: PAD DEFIB RADIO PHYSIO CONN (PAD) ×1 IMPLANT

## 2024-05-14 NOTE — CV Procedure (Signed)
 Electrical Cardioversion Procedure Note Tracy Bennett [id number] 09-18-1946  Procedure: Electrical Cardioversion Indications:  Atrial Fibrillation  Procedure Details Consent: Risks of procedure as well as the alternatives and risks of each were explained to the (patient/caregiver).  Consent for procedure obtained. Time Out: Verified patient identification, verified procedure, site/side was marked, verified correct patient position, special equipment/implants available, medications/allergies/relevent history reviewed, required imaging and test results available.  Performed  Patient placed on cardiac monitor, pulse oximetry, supplemental oxygen as necessary.  Sedation given: propofol  and lidocaine   Pacer pads placed Initially AP then apex/base.  Cardioverted 3 time(s).  Cardioversion attempted 3 times   1.  AP position with  250 J synchronized biphasic energy  Unsuccessful 2  AP position with 360 J synchronized biphasic energy with chest pressure applied  Unsuccessful 3.  Apex/ base position with 360 J synchronized biphasic energy  Successful to SR   Evaluation Findings: Post procedure EKG shows: NSR Complications: None Patient did tolerate procedure well.   Tracy Bennett 05/14/2024, 9:35 AM

## 2024-05-14 NOTE — Anesthesia Preprocedure Evaluation (Addendum)
 Anesthesia Evaluation  Patient identified by MRN, date of birth, ID band Patient awake    Reviewed: Allergy & Precautions, NPO status , Patient's Chart, lab work & pertinent test results, reviewed documented beta blocker date and time   Airway Mallampati: III  TM Distance: >3 FB Neck ROM: Full    Dental no notable dental hx. (+) Teeth Intact, Dental Advisory Given, Caps   Pulmonary shortness of breath and with exertion, sleep apnea (BiPAP) and Continuous Positive Airway Pressure Ventilation , pneumonia, resolved, former smoker   Pulmonary exam normal breath sounds clear to auscultation       Cardiovascular hypertension, Pt. on medications Normal cardiovascular exam+ dysrhythmias Atrial Fibrillation  Rhythm:Irregular Rate:Normal  TTE 02/28/2024  1. Left ventricular ejection fraction, by estimation, is 60 to 65%. The  left ventricle has normal function. The left ventricle has no regional  wall motion abnormalities. Left ventricular diastolic parameters are  indeterminate.   2. Right ventricular systolic function is normal. The right ventricular  size is normal.   3. Right atrial size was mild to moderately dilated.   4. The mitral valve is normal in structure. No evidence of mitral valve  regurgitation. No evidence of mitral stenosis.   5. The aortic valve is normal in structure. Aortic valve regurgitation is  not visualized. No aortic stenosis is present.   6. The inferior vena cava is normal in size with greater than 50%  respiratory variability, suggesting right atrial pressure of 3 mmHg.   EKG 04/14/24 Atrial fibrillation Nonspecific ST and T wave abnormality   Neuro/Psych  Neuromuscular disease  negative psych ROS   GI/Hepatic Neg liver ROS,GERD  Medicated,,  Endo/Other  diabetes, Well Controlled, Type 2, Oral Hypoglycemic Agents  Class 3 obesity (BMI 42)Hyperlipidemia  Renal/GU Renal InsufficiencyRenal disease  negative  genitourinary   Musculoskeletal  (+) Arthritis , Osteoarthritis,  DDD cervical spine   Abdominal  (+) + obese  Peds  Hematology Eliquis  therapy- last dose   Anesthesia Other Findings   Reproductive/Obstetrics                             Anesthesia Physical Anesthesia Plan  ASA: 3  Anesthesia Plan: General   Post-op Pain Management: Minimal or no pain anticipated   Induction: Intravenous  PONV Risk Score and Plan: Propofol  infusion and Treatment may vary due to age or medical condition  Airway Management Planned: Natural Airway and Nasal Cannula  Additional Equipment: None  Intra-op Plan:   Post-operative Plan:   Informed Consent: I have reviewed the patients History and Physical, chart, labs and discussed the procedure including the risks, benefits and alternatives for the proposed anesthesia with the patient or authorized representative who has indicated his/her understanding and acceptance.     Dental advisory given  Plan Discussed with: CRNA and Anesthesiologist  Anesthesia Plan Comments:        Anesthesia Quick Evaluation

## 2024-05-14 NOTE — Transfer of Care (Signed)
 Immediate Anesthesia Transfer of Care Note  Patient: Tracy Bennett  Procedure(s) Performed: CARDIOVERSION  Patient Location: Cath Lab  Anesthesia Type:General  Level of Consciousness: awake, alert , and oriented  Airway & Oxygen Therapy: Patient Spontanous Breathing and Patient connected to nasal cannula oxygen  Post-op Assessment: Report given to RN and Post -op Vital signs reviewed and stable  Post vital signs: Reviewed and stable  Last Vitals:  Vitals Value Taken Time  BP 123/76 05/14/24 09:21  Temp    Pulse 69 05/14/24 09:22  Resp 22 05/14/24 09:22  SpO2 95 % 05/14/24 09:22  Vitals shown include unfiled device data.  Last Pain:  Vitals:   05/14/24 0914  TempSrc:   PainSc: 0-No pain         Complications: No notable events documented.

## 2024-05-14 NOTE — Interval H&P Note (Signed)
 History and Physical Interval Note:  05/14/2024 9:11 AM  Tracy Bennett  has presented today for surgery, with the diagnosis of AFIB.  The various methods of treatment have been discussed with the patient and family. After consideration of risks, benefits and other options for treatment, the patient has consented to  Procedure(s): CARDIOVERSION (N/A) as a surgical intervention.  The patient's history has been reviewed, patient examined, no change in status, stable for surgery.  I have reviewed the patient's chart and labs.  Questions were answered to the patient's satisfaction.     Ola Berger

## 2024-05-14 NOTE — Anesthesia Postprocedure Evaluation (Signed)
 Anesthesia Post Note  Patient: Tracy Bennett  Procedure(s) Performed: CARDIOVERSION     Patient location during evaluation: PACU Anesthesia Type: General Level of consciousness: awake and alert and oriented Pain management: pain level controlled Vital Signs Assessment: post-procedure vital signs reviewed and stable Respiratory status: spontaneous breathing, nonlabored ventilation and respiratory function stable Cardiovascular status: blood pressure returned to baseline and stable Postop Assessment: no apparent nausea or vomiting Anesthetic complications: no   No notable events documented.  Last Vitals:  Vitals:   05/14/24 0940 05/14/24 0945  BP: 110/69 131/83  Pulse: 60 (!) 59  Resp: 20 12  Temp:    SpO2: 96% 96%    Last Pain:  Vitals:   05/14/24 0935  TempSrc:   PainSc: 0-No pain                 Jesilyn Easom A.

## 2024-05-27 ENCOUNTER — Ambulatory Visit: Attending: Cardiology | Admitting: Cardiology

## 2024-05-27 ENCOUNTER — Encounter: Payer: Self-pay | Admitting: Cardiology

## 2024-05-27 VITALS — BP 118/62 | HR 66 | Ht 70.0 in | Wt 287.0 lb

## 2024-05-27 DIAGNOSIS — D6869 Other thrombophilia: Secondary | ICD-10-CM | POA: Insufficient documentation

## 2024-05-27 DIAGNOSIS — I5032 Chronic diastolic (congestive) heart failure: Secondary | ICD-10-CM | POA: Insufficient documentation

## 2024-05-27 DIAGNOSIS — I4819 Other persistent atrial fibrillation: Secondary | ICD-10-CM | POA: Insufficient documentation

## 2024-05-27 DIAGNOSIS — I10 Essential (primary) hypertension: Secondary | ICD-10-CM | POA: Diagnosis not present

## 2024-05-27 DIAGNOSIS — I480 Paroxysmal atrial fibrillation: Secondary | ICD-10-CM | POA: Diagnosis not present

## 2024-05-27 DIAGNOSIS — G4733 Obstructive sleep apnea (adult) (pediatric): Secondary | ICD-10-CM | POA: Diagnosis not present

## 2024-05-27 NOTE — Patient Instructions (Signed)
 Medication Instructions:  Your physician recommends that you continue on your current medications as directed. Please refer to the Current Medication list given to you today.  *If you need a refill on your cardiac medications before your next appointment, please call your pharmacy*  Testing/Procedures: Ablation Your physician has recommended that you have an ablation. Catheter ablation is a medical procedure used to treat some cardiac arrhythmias (irregular heartbeats). During catheter ablation, a long, thin, flexible tube is put into a blood vessel in your groin (upper thigh), or neck. This tube is called an ablation catheter. It is then guided to your heart through the blood vessel. Radio frequency waves destroy small areas of heart tissue where abnormal heartbeats may cause an arrhythmia to start.   You are scheduled for Atrial Fibrillation Ablation on Tuesday, September 16 with Dr. Ole Holts.Please arrive at the Main Entrance A at [facility]: [address] [place], [place] [address] at 5:30 AM   What To Expect:  Labs: you will need to have lab work drawn within 30 days of your procedure. Please go to any [facility] location to have these drawn - no appointment is needed. You will receive procedure instructions either through MyChart or in the mail 4-6 week prior to your procedure.  After your procedure we recommend no driving for 3 days, no lifting over 10 lbs for 5 days, and no work or strenuous activity for 7 days.  Please contact our office at [phone] if you have any questions.    Follow-Up: We will contact you to schedule your post-procedure appointments.

## 2024-05-27 NOTE — Progress Notes (Unsigned)
 Electrophysiology Office Note:   Date:  05/28/2024  ID:  Tracy Bennett, DOB [birth year], MRN [id number]  Primary Cardiologist: None Electrophysiologist: Fonda Kitty, MD      History of Present Illness:   Tracy Bennett is a 78 y.o. male with h/o HTN, chronic diastolic heart failure, PVCs, DM, HLD, OSA, atrial fibrillation who is being seen today for evaluation for catheter ablation.  Persistent atrial fibrillation was first identified during an EKG in March 2025. We do not know when exactly AF started. He has not had any episodes of normal rhythm on his personal Kardia mobile device since being informed about his AF. No rapid heart rate or palpitations, but he has increased fatigue and requires more frequent breaks during physical activities compared to previous years. During our last visit, he was started on amiodarone  and underwent successful cardioversion on 6/18.  Discussed the use of AI scribe software for clinical note transcription with the patient, who gave verbal consent to proceed.  History of Present Illness He recently underwent cardioversion, which successfully restored him to normal sinus rhythm. He feels significantly better, with increased energy and no longer experiences shortness of breath. He is currently maintained on amiodarone .He is taking amiodarone , initially prescribed at 400 mg BID and now reduced to 200 mg BID. Since reducing the dose, he has experienced some improvement in bowel movements, which were previously affected by the medication. He also uses Miralax and prune juice to manage constipation.He is on Eliquis  for anticoagulation due to atrial fibrillation. He notes prolonged bleeding from minor cuts while on Eliquis , which is concerning, especially during yard work. No significant bleeding episodes such as blood in stool or urine. He uses a Kardia mobile device to monitor his heart rhythm, which now consistently shows normal sinus rhythm. His heart rate has decreased  from 100-120 bpm to 66 bpm, contributing to his improved energy levels.  His past medical history includes hypertension, obesity, and sleep apnea, which are known risk factors for atrial fibrillation. He is managing his weight and diabetes with Mounjaro  and Farxiga , and takes valsartan  for blood pressure control. He uses CPAP for his OSA.   Review of systems complete and found to be negative unless listed in HPI.   EP Information / Studies Reviewed:    EKG is ordered today. Personal review as below. EKG Interpretation Date/Time:  Tuesday May 27 2024 14:12:39 EDT Ventricular Rate:  66 PR Interval:  172 QRS Duration:  86 QT Interval:  402 QTC Calculation: 421 R Axis:   51  Text Interpretation: Normal sinus rhythm Nonspecific ST and T wave abnormality When compared with ECG of 14-May-2024 10:04, Premature ventricular complexes are no longer Present Confirmed by Kitty Fonda [phone]) on 05/27/2024 2:24:26 PM   EKG 04/14/24: AF   Echo 02/28/24:   1. Left ventricular ejection fraction, by estimation, is 60 to 65%. The  left ventricle has normal function. The left ventricle has no regional  wall motion abnormalities. Left ventricular diastolic parameters are  indeterminate.   2. Right ventricular systolic function is normal. The right ventricular  size is normal.   3. Right atrial size was mild to moderately dilated.   4. The mitral valve is normal in structure. No evidence of mitral valve  regurgitation. No evidence of mitral stenosis.   5. The aortic valve is normal in structure. Aortic valve regurgitation is  not visualized. No aortic stenosis is present.   6. The inferior vena cava is normal in size with greater than 50%  respiratory variability, suggesting right atrial pressure of 3 mmHg.    Risk Assessment/Calculations:    CHA2DS2-VASc Score = 5   This indicates a 7.2% annual risk of stroke. The patient's score is based upon: CHF History: 1 HTN History: 1 Diabetes History:  1 Stroke History: 0 Vascular Disease History: 0 Age Score: 2 Gender Score: 0             Physical Exam:   VS:  BP 118/62   Pulse 66   Ht 5' 10 (1.778 m)   Wt 287 lb (130.2 kg)   SpO2 94%   BMI 41.18 kg/m    Wt Readings from Last 3 Encounters:  05/27/24 287 lb (130.2 kg)  05/14/24 284 lb (128.8 kg)  04/29/24 288 lb 6.4 oz (130.8 kg)     GEN: Well nourished, well developed in no acute distress NECK: No JVD CARDIAC: Bradycardic, regular rhythm. RESPIRATORY:  Clear to auscultation without rales, wheezing or rhonchi  ABDOMEN: Soft, non-distended EXTREMITIES:  No edema; No deformity   ASSESSMENT AND PLAN:    #. Persistent atrial fibrillation, symptomatic: History of diastolic heart failure.  For this reason, we pursued a rhythm control strategy.  Patient was loaded on amiodarone  and underwent successful cardioversion on 6/18.  He has been maintaining sinus rhythm since.  He reports feeling significantly better in sinus rhythm. #. Secondary hypercoagulable state due to AF:  -Discussed treatment options today for AF including antiarrhythmic drug therapy and ablation. Discussed risks, recovery and likelihood of success with each treatment strategy. Risk, benefits, and alternatives to EP study and ablation for afib were discussed. These risks include but are not limited to stroke, bleeding, vascular damage, tamponade, perforation, damage to the esophagus, lungs, phrenic nerve and other structures, pulmonary vein stenosis, worsening renal function, coronary vasospasm and death.  Discussed potential need for repeat ablation procedures and antiarrhythmic drugs after an initial ablation. The patient understands these risk and wishes to proceed.  We will therefore proceed with catheter ablation at the next available time.  Carto, ICE, anesthesia are requested for the procedure.   -Continue amiodarone  as bridge to ablation. Decrease amiodarone  to 200mg  daily. - Continue Eliquis  5mg  BID.  #.  Chronic diastolic heart failure: Well compensated today. - Rhythm control as above.  - Continue follow up with general cardiology.   #Hypertension -At goal today.  Recommend checking blood pressures 1-2 times per week at home and recording the values.  Recommend bringing these recordings to the primary care physician.  #. OSA:  -Encouraged BiPAP use.   Follow up with Dr. Kennyth 3 months after ablation.  Signed, Fonda Kennyth, MD

## 2024-05-29 ENCOUNTER — Telehealth: Payer: Self-pay | Admitting: Cardiology

## 2024-05-29 ENCOUNTER — Other Ambulatory Visit: Payer: Self-pay | Admitting: Family Medicine

## 2024-05-29 MED ORDER — AMIODARONE HCL 200 MG PO TABS: 200.0000 mg | ORAL_TABLET | Freq: Every day | ORAL | Status: DC

## 2024-05-29 NOTE — Telephone Encounter (Signed)
 Pt requesting cb to discuss Ablation scheduled for 9/16

## 2024-05-29 NOTE — Telephone Encounter (Signed)
 Pt calling b/c his AVS said that Dr. Cindie would be doing the ablation and he thought it was Dr. Kennyth doing it.  Pt made aware that his procedure was scheduled under Kennyth and that AVS was a mistake. He also mentioned that Dr. Kennyth lowered his Amiodarone  to 200 mg daily.  Aware that I will update his medication list to reflect this.  Forwarding to RN for her FYI.

## 2024-06-02 ENCOUNTER — Other Ambulatory Visit: Payer: Self-pay | Admitting: Family Medicine

## 2024-06-02 DIAGNOSIS — H0102B Squamous blepharitis left eye, upper and lower eyelids: Secondary | ICD-10-CM | POA: Diagnosis not present

## 2024-06-02 DIAGNOSIS — H0102A Squamous blepharitis right eye, upper and lower eyelids: Secondary | ICD-10-CM | POA: Diagnosis not present

## 2024-06-02 DIAGNOSIS — Z9841 Cataract extraction status, right eye: Secondary | ICD-10-CM | POA: Diagnosis not present

## 2024-06-02 DIAGNOSIS — Z961 Presence of intraocular lens: Secondary | ICD-10-CM | POA: Diagnosis not present

## 2024-06-02 DIAGNOSIS — Z9842 Cataract extraction status, left eye: Secondary | ICD-10-CM | POA: Diagnosis not present

## 2024-06-02 DIAGNOSIS — H04123 Dry eye syndrome of bilateral lacrimal glands: Secondary | ICD-10-CM | POA: Diagnosis not present

## 2024-06-02 DIAGNOSIS — Z9889 Other specified postprocedural states: Secondary | ICD-10-CM | POA: Diagnosis not present

## 2024-06-02 MED ORDER — DAPAGLIFLOZIN PROPANEDIOL 5 MG PO TABS: 5.0000 mg | ORAL_TABLET | Freq: Every day | ORAL | 1 refills | Status: AC

## 2024-06-02 NOTE — Telephone Encounter (Signed)
 Copied from CRM [id number]. Topic: Clinical - Medication Refill >> Jun 02, 2024 10:01 AM Carrielelia G wrote: Medication: dapagliflozin  propanediol (FARXIGA ) 5 MG TABS tablet  Has the patient contacted their pharmacy? Yes (Agent: If no, request that the patient contact the pharmacy for the refill. If patient does not wish to contact the pharmacy document the reason why and proceed with request.) (Agent: If yes, when and what did the pharmacy advise?)  This is the patient's preferred pharmacy:  [id number] - [place], [place] - [address] AT [facility] [address] [place]  [address] Phone: [phone] Fax: [phone]  Is this the correct pharmacy for this prescription? Yes If no, delete pharmacy and type the correct one.    Is the patient out of the medication? No  (three left)   Has the patient been seen for an appointment in the last year OR does the patient have an upcoming appointment? Yes  Can we respond through MyChart? Yes  Agent: Please be advised that Rx refills may take up to 3 business days. We ask that you follow-up with your pharmacy.

## 2024-06-04 ENCOUNTER — Telehealth: Payer: Self-pay | Admitting: Cardiology

## 2024-06-04 NOTE — Telephone Encounter (Signed)
*  STAT* If patient is at the pharmacy, call can be transferred to refill team.   1. Which medications need to be refilled? (please list name of each medication and dose if known)   amiodarone  (PACERONE ) 200 MG tablet   2. Would you like to learn more about the convenience, safety, & potential cost savings by using the [facility]?   3. Are you open to using the [facility] (Type [facility]. ).  4. Which pharmacy/location (including street and city if local pharmacy) is medication to be sent to?  [id number] - [place], [place] - [address] AT [address]   5. Do they need a 30 day or 90 day supply?   90 day  Patient stated his medication was increased and will need to be on this medication until his procedure on 9/16.  Patient stated he only has 4 tablets left.

## 2024-06-05 MED ORDER — AMIODARONE HCL 200 MG PO TABS: 200.0000 mg | ORAL_TABLET | Freq: Every day | ORAL | 3 refills | Status: DC

## 2024-06-05 NOTE — Telephone Encounter (Signed)
 Called pt and pt stated that his pharmacy needed a new prescription stating that pt takes medication amiodarone  200 mg tablets daily before pt could get a new refill. Pt's medication was resent to pt's pharmacy as requested for new directions. Confirmation received. I advised pt that if he has any other problems, questions or concerns, to give our office a call back. Pt verbalized understanding.

## 2024-06-24 DIAGNOSIS — Z129 Encounter for screening for malignant neoplasm, site unspecified: Secondary | ICD-10-CM | POA: Diagnosis not present

## 2024-06-24 DIAGNOSIS — L218 Other seborrheic dermatitis: Secondary | ICD-10-CM | POA: Diagnosis not present

## 2024-06-24 DIAGNOSIS — L570 Actinic keratosis: Secondary | ICD-10-CM | POA: Diagnosis not present

## 2024-06-24 DIAGNOSIS — D0462 Carcinoma in situ of skin of left upper limb, including shoulder: Secondary | ICD-10-CM | POA: Diagnosis not present

## 2024-07-17 ENCOUNTER — Telehealth: Payer: Self-pay

## 2024-07-17 ENCOUNTER — Other Ambulatory Visit: Payer: Self-pay

## 2024-07-17 DIAGNOSIS — I4819 Other persistent atrial fibrillation: Secondary | ICD-10-CM

## 2024-07-17 NOTE — Telephone Encounter (Signed)
 Ablation Instruction letter mailed to pt.

## 2024-07-25 DIAGNOSIS — I4819 Other persistent atrial fibrillation: Secondary | ICD-10-CM | POA: Diagnosis not present

## 2024-07-26 LAB — BASIC METABOLIC PANEL WITH GFR
BUN/Creatinine Ratio: 10 (ref 10–24)
BUN: 13 mg/dL (ref 8–27)
CO2: 25 mmol/L (ref 20–29)
Calcium: 9.3 mg/dL (ref 8.6–10.2)
Chloride: 99 mmol/L (ref 96–106)
Creatinine, Ser: 1.28 mg/dL — ABNORMAL HIGH (ref 0.76–1.27)
Glucose: 77 mg/dL (ref 70–99)
Potassium: 3.6 mmol/L (ref 3.5–5.2)
Sodium: 140 mmol/L (ref 134–144)
eGFR: 57 mL/min/1.73 — ABNORMAL LOW (ref >59)

## 2024-07-26 LAB — CBC
Hematocrit: 47.6 % (ref 37.5–51.0)
Hemoglobin: 15.4 g/dL (ref 13.0–17.7)
MCH: 29.3 pg (ref 26.6–33.0)
MCHC: 32.4 g/dL (ref 31.5–35.7)
MCV: 91 fL (ref 79–97)
Platelets: 148 x10E3/uL — ABNORMAL LOW (ref 150–450)
RBC: 5.25 x10E6/uL (ref 4.14–5.80)
RDW: 13.7 % (ref 11.6–15.4)
WBC: 6.5 x10E3/uL (ref 3.4–10.8)

## 2024-07-30 DIAGNOSIS — H6063 Unspecified chronic otitis externa, bilateral: Secondary | ICD-10-CM | POA: Diagnosis not present

## 2024-07-30 DIAGNOSIS — H6123 Impacted cerumen, bilateral: Secondary | ICD-10-CM | POA: Diagnosis not present

## 2024-08-03 ENCOUNTER — Ambulatory Visit: Payer: Self-pay | Admitting: Cardiology

## 2024-08-04 ENCOUNTER — Encounter: Payer: Self-pay | Admitting: Family Medicine

## 2024-08-04 ENCOUNTER — Ambulatory Visit (INDEPENDENT_AMBULATORY_CARE_PROVIDER_SITE_OTHER): Admitting: Family Medicine

## 2024-08-04 VITALS — BP 114/80 | HR 78 | Ht 69.0 in | Wt 279.1 lb

## 2024-08-04 DIAGNOSIS — N1831 Chronic kidney disease, stage 3a: Secondary | ICD-10-CM | POA: Diagnosis not present

## 2024-08-04 DIAGNOSIS — Z7984 Long term (current) use of oral hypoglycemic drugs: Secondary | ICD-10-CM

## 2024-08-04 DIAGNOSIS — Z7985 Long-term (current) use of injectable non-insulin antidiabetic drugs: Secondary | ICD-10-CM | POA: Diagnosis not present

## 2024-08-04 DIAGNOSIS — J069 Acute upper respiratory infection, unspecified: Secondary | ICD-10-CM

## 2024-08-04 DIAGNOSIS — J302 Other seasonal allergic rhinitis: Secondary | ICD-10-CM | POA: Diagnosis not present

## 2024-08-04 DIAGNOSIS — N183 Chronic kidney disease, stage 3 unspecified: Secondary | ICD-10-CM

## 2024-08-04 DIAGNOSIS — E1122 Type 2 diabetes mellitus with diabetic chronic kidney disease: Secondary | ICD-10-CM

## 2024-08-04 DIAGNOSIS — I5030 Unspecified diastolic (congestive) heart failure: Secondary | ICD-10-CM

## 2024-08-04 DIAGNOSIS — R7989 Other specified abnormal findings of blood chemistry: Secondary | ICD-10-CM

## 2024-08-04 DIAGNOSIS — E118 Type 2 diabetes mellitus with unspecified complications: Secondary | ICD-10-CM | POA: Diagnosis not present

## 2024-08-04 LAB — POCT GLYCOSYLATED HEMOGLOBIN (HGB A1C): Hemoglobin A1C: 5.5 % — AB (ref 4.0–5.6)

## 2024-08-04 MED ORDER — FLUTICASONE PROPIONATE 50 MCG/ACT NA SUSP: 2.0000 | Freq: Every day | NASAL | 6 refills | Status: DC

## 2024-08-04 MED ORDER — HYDROCODONE BIT-HOMATROP MBR 5-1.5 MG/5ML PO SOLN: 5.0000 mL | Freq: Three times a day (TID) | ORAL | 0 refills | Status: DC | PRN

## 2024-08-04 NOTE — Assessment & Plan Note (Signed)
 Denies any volume overload. No excess welling.

## 2024-08-04 NOTE — Progress Notes (Signed)
 Established Patient Office Visit  Subjective  Patient ID: Tracy Bennett, male    DOB: [birth year]  Age: 78 y.o. MRN: [id number]  Chief Complaint  Patient presents with   Diabetes   Hypertension   Sinusitis    HPI  Here for blood pressure and diabetes follow-up today.  He is currently on Farxiga  and 15 mg of Mounjaro .  He feels like he has plateaued on the medication, but he has lost weight another 8 pounds since we last saw him.  He did start having a cough and some nasal drainage about 3-4 days ago.  He says his symptoms started initially with sneezing then he got a sore throat and then developed sinus pressure and congestion and now has a cough.  He does feel like the sore throat is better.  And he is reports that the congestion is just slightly better today.  Requesting a refill on cough syrup.  He is scheduled for an ablation for atrial fibrillation on September 16.  Diabetes - no hypoglycemic events. No wounds or sores that are not healing well. No increased thirst or urination. Checking glucose at home. Taking medications as prescribed without any side effects.   ROS    Objective:     BP 114/80   Pulse 78   Ht 5' 9 (1.753 m)   Wt 279 lb 1.9 oz (126.6 kg)   SpO2 93%   BMI 41.22 kg/m     Physical Exam Constitutional:      Appearance: Normal appearance.  HENT:     Head: Normocephalic and atraumatic.     Right Ear: Tympanic membrane, ear canal and external ear normal. There is no impacted cerumen.     Left Ear: Tympanic membrane, ear canal and external ear normal. There is no impacted cerumen.     Nose: Nose normal.     Mouth/Throat:     Pharynx: Oropharynx is clear.  Eyes:     Conjunctiva/sclera: Conjunctivae normal.  Cardiovascular:     Rate and Rhythm: Normal rate and regular rhythm.  Pulmonary:     Effort: Pulmonary effort is normal.     Breath sounds: Normal breath sounds.  Musculoskeletal:     Cervical back: Neck supple. No tenderness.   Lymphadenopathy:     Cervical: No cervical adenopathy.  Skin:    General: Skin is warm and dry.  Neurological:     Mental Status: He is alert and oriented to person, place, and time.  Psychiatric:        Mood and Affect: Mood normal.      Results for orders placed or performed in visit on 08/04/24  POCT HgB A1C  Result Value Ref Range   Hemoglobin A1C 5.5 (A) 4.0 - 5.6 %   HbA1c POC (<> result, manual entry)     HbA1c, POC (prediabetic range)     HbA1c, POC (controlled diabetic range)         The ASCVD Risk score (Arnett DK, et al., 2019) failed to calculate for the following reasons:   The valid total cholesterol range is 130 to 320 mg/dL    Assessment & Plan:   Problem List Items Addressed This Visit       Cardiovascular and Mediastinum   (HFpEF) heart failure with preserved ejection fraction (HCC)   Denies any volume overload. No excess welling.         Endocrine   Controlled diabetes mellitus type 2 with complications (HCC) - Primary   A1c looks  absolutely phenomenal at 5.5.  It does not [facility] he is had any hypoglycemic events which is great.  Continue with Mounjaro  15 mg.  He is going to have to hold it for a week before his procedure.      Relevant Orders   POCT HgB A1C (Completed)   Urine Microalbumin w/creat. ratio     Genitourinary   CKD (chronic kidney disease) stage 3, GFR 30-59 ml/min (HCC)   Check urine micro      Relevant Orders   Urine Microalbumin w/creat. ratio   Other Visit Diagnoses       Seasonal allergic rhinitis, unspecified trigger       Relevant Medications   fluticasone  (FLONASE ) 50 MCG/ACT nasal spray     Viral upper respiratory tract infection         Elevated serum creatinine       Relevant Orders   Basic Metabolic Panel (BMET)      Upper respiratory infection-we did evaluate for COVID today.  It does not sound bacterial at this point but if not improving please let us  know cough syrup sent to pharmacy.  I do think there  is some component of seasonal allergies as he tends to get sick around this time a year and has been doing a lot of yard work and it started after doing some yard work last week and having several sneezing fits.  Will go ahead and start trial of fluticasone  as well.   Return in about 4 months (around 12/04/2024) for Diabetes follow-up.    Dorothyann Byars, MD

## 2024-08-04 NOTE — Assessment & Plan Note (Signed)
Check urine micro

## 2024-08-04 NOTE — Assessment & Plan Note (Signed)
 A1c looks absolutely phenomenal at 5.5.  It does not [facility] he is had any hypoglycemic events which is great.  Continue with Mounjaro  15 mg.  He is going to have to hold it for a week before his procedure.

## 2024-08-05 ENCOUNTER — Ambulatory Visit: Payer: Self-pay | Admitting: Family Medicine

## 2024-08-05 LAB — BASIC METABOLIC PANEL WITH GFR
BUN/Creatinine Ratio: 9 — ABNORMAL LOW (ref 10–24)
BUN: 12 mg/dL (ref 8–27)
CO2: 21 mmol/L (ref 20–29)
Calcium: 9.4 mg/dL (ref 8.6–10.2)
Chloride: 100 mmol/L (ref 96–106)
Creatinine, Ser: 1.33 mg/dL — ABNORMAL HIGH (ref 0.76–1.27)
Glucose: 91 mg/dL (ref 70–99)
Potassium: 3.9 mmol/L (ref 3.5–5.2)
Sodium: 138 mmol/L (ref 134–144)
eGFR: 55 mL/min/1.73 — ABNORMAL LOW (ref >59)

## 2024-08-05 LAB — MICROALBUMIN / CREATININE URINE RATIO
Creatinine, Urine: 271.7 mg/dL
Microalb/Creat Ratio: 4 mg/g{creat} (ref 0–29)
Microalbumin, Urine: 11.2 ug/mL

## 2024-08-05 NOTE — Progress Notes (Signed)
 Hi Haskel, kidney function is similar as 1.3 normally around 1.1-1.2.  But fairly stable.  No excess protein in the urine.

## 2024-08-06 ENCOUNTER — Telehealth: Payer: Self-pay

## 2024-08-06 NOTE — Telephone Encounter (Signed)
 Copied from CRM [id number]. Topic: Clinical - Medication Question >> Aug 06, 2024 11:35 AM Susanna ORN wrote: Reason for CRM: Patient saw Dr. Alvan two days ago. States he was developing a sinus infection or allergy issue and was coughing up phlegm. Patient states that he still has pressure in his head and phlegm is now yellow. He states that Dr. Alvan told him if he didn't feel better, to call back and she would send in something to pharmacy. Patient would like for her to send something to [facility] in [place]. Patient states that if it's bacterial, she usually sends in a Z-pak but if not, she sends in Prednisone . Please give patient a call once this has been sent to notify him. CB #: [phone]

## 2024-08-08 MED ORDER — DOXYCYCLINE HYCLATE 100 MG PO TABS: 100.0000 mg | ORAL_TABLET | Freq: Two times a day (BID) | ORAL | 0 refills | Status: DC

## 2024-08-08 MED ORDER — PREDNISONE 20 MG PO TABS: 40.0000 mg | ORAL_TABLET | Freq: Every day | ORAL | 0 refills | Status: DC

## 2024-08-08 NOTE — Telephone Encounter (Signed)
 Prescription for antibiotic and prednisone  sent to pharmacy.

## 2024-08-11 NOTE — Anesthesia Preprocedure Evaluation (Signed)
 Anesthesia Evaluation    Reviewed: Allergy & Precautions, Patient's Chart, lab work & pertinent test results  History of Anesthesia Complications Negative for: history of anesthetic complications  Airway        Dental   Pulmonary sleep apnea , former smoker          Cardiovascular hypertension, Pt. on medications + dysrhythmias Atrial Fibrillation    '25 TTE - EF 60 to 65%. Right atrial size was mild to moderately dilated. No significant valvular d/o identified.     Neuro/Psych negative neurological ROS  negative psych ROS   GI/Hepatic Neg liver ROS,GERD  Medicated,,  Endo/Other  diabetes, Type 2, Oral Hypoglycemic Agents  Class 3 obesity On GLP-1a   Renal/GU CRFRenal disease     Musculoskeletal negative musculoskeletal ROS (+)    Abdominal   Peds  Hematology  On eliquis     Anesthesia Other Findings   Reproductive/Obstetrics                              Anesthesia Physical Anesthesia Plan  ASA: 3  Anesthesia Plan: General   Post-op Pain Management: Tylenol  PO (pre-op)*   Induction: Intravenous  PONV Risk Score and Plan: 2 and Treatment may vary due to age or medical condition, Ondansetron  and Propofol  infusion  Airway Management Planned: Oral ETT  Additional Equipment: None  Intra-op Plan:   Post-operative Plan: Extubation in OR  Informed Consent:   Plan Discussed with: CRNA and Anesthesiologist  Anesthesia Plan Comments: (Patient presented with significant, hoarse cough and sinus congestion. Was prescribed prednisone  last week by PCP, but did not take it. Decision made to reschedule after resolution of URI. )         Anesthesia Quick Evaluation

## 2024-08-11 NOTE — Pre-Procedure Instructions (Signed)
 Instructed patient on the following items: Arrival time 0515 Nothing to eat or drink after midnight No meds AM of procedure Responsible person to drive you home and stay with you for 24 hrs  Have you missed any doses of anti-coagulant Eliquis - takes twice a day, hasn't missed any doses in last 4 weeks.  Don't take dose morning of procedure.  States he had cough, sore throat and nasal draiage last week.  Went to PCP, treated with cough medicine, Prednisone - which he didn't take.  PCP also did a covid test which was negative.   Will make sure Dr Kennyth is aware.

## 2024-08-11 NOTE — Telephone Encounter (Signed)
 Attempted call to patient. Could not leave a voice mail message as voice mail not yet set up.

## 2024-08-12 ENCOUNTER — Encounter (HOSPITAL_COMMUNITY): Payer: Self-pay | Admitting: Anesthesiology

## 2024-08-12 ENCOUNTER — Other Ambulatory Visit: Payer: Self-pay

## 2024-08-12 ENCOUNTER — Encounter (HOSPITAL_COMMUNITY)
Admission: RE | Disposition: A | Discharge status: Home / Self Care | Payer: Self-pay | Source: Home / Self Care | Attending: Cardiology

## 2024-08-12 ENCOUNTER — Ambulatory Visit (HOSPITAL_COMMUNITY)
Admission: RE | Admit: 2024-08-12 | Discharge: 2024-08-12 | Disposition: A | Discharge status: Home / Self Care | Attending: Cardiology | Admitting: Cardiology

## 2024-08-12 DIAGNOSIS — Z538 Procedure and treatment not carried out for other reasons: Secondary | ICD-10-CM | POA: Insufficient documentation

## 2024-08-12 DIAGNOSIS — R000 Tachycardia, unspecified: Secondary | ICD-10-CM | POA: Insufficient documentation

## 2024-08-12 LAB — GLUCOSE, CAPILLARY: Glucose-Capillary: 107 mg/dL — ABNORMAL HIGH (ref 70–99)

## 2024-08-12 SURGERY — ATRIAL FIBRILLATION ABLATION
Anesthesia: General

## 2024-08-12 NOTE — Telephone Encounter (Signed)
 Spoke with patient wife Rumalda. States patient did pick up the medication but did not start it because he was suppose to have a heart procedure today . They would not do the procedure due to the cough so he has started the medications today. He will reach out if symptoms do not improve with medication use.

## 2024-08-13 DIAGNOSIS — Z87891 Personal history of nicotine dependence: Secondary | ICD-10-CM | POA: Diagnosis not present

## 2024-08-13 DIAGNOSIS — R58 Hemorrhage, not elsewhere classified: Secondary | ICD-10-CM | POA: Diagnosis not present

## 2024-08-13 DIAGNOSIS — I83891 Varicose veins of right lower extremities with other complications: Secondary | ICD-10-CM | POA: Diagnosis not present

## 2024-08-13 DIAGNOSIS — S81812A Laceration without foreign body, left lower leg, initial encounter: Secondary | ICD-10-CM | POA: Diagnosis not present

## 2024-08-13 DIAGNOSIS — X58XXXA Exposure to other specified factors, initial encounter: Secondary | ICD-10-CM | POA: Diagnosis not present

## 2024-08-13 DIAGNOSIS — I83811 Varicose veins of right lower extremities with pain: Secondary | ICD-10-CM | POA: Diagnosis not present

## 2024-08-13 DIAGNOSIS — Z7901 Long term (current) use of anticoagulants: Secondary | ICD-10-CM | POA: Diagnosis not present

## 2024-08-14 ENCOUNTER — Encounter: Payer: Self-pay | Admitting: Family Medicine

## 2024-08-14 ENCOUNTER — Ambulatory Visit: Admitting: Family Medicine

## 2024-08-14 ENCOUNTER — Ambulatory Visit: Payer: Self-pay

## 2024-08-14 VITALS — BP 133/74 | HR 80 | Ht 70.0 in | Wt 279.0 lb

## 2024-08-14 DIAGNOSIS — J40 Bronchitis, not specified as acute or chronic: Secondary | ICD-10-CM | POA: Diagnosis not present

## 2024-08-14 DIAGNOSIS — R051 Acute cough: Secondary | ICD-10-CM | POA: Diagnosis not present

## 2024-08-14 DIAGNOSIS — J329 Chronic sinusitis, unspecified: Secondary | ICD-10-CM | POA: Insufficient documentation

## 2024-08-14 MED ORDER — BENZONATATE 200 MG PO CAPS
200.0000 mg | ORAL_CAPSULE | Freq: Two times a day (BID) | ORAL | 0 refills | Status: DC | PRN
Start: 2024-08-14 — End: 2024-08-20

## 2024-08-14 MED ORDER — AZITHROMYCIN 250 MG PO TABS: ORAL_TABLET | ORAL | 0 refills | Status: DC

## 2024-08-14 MED ORDER — HYDROCOD POLI-CHLORPHE POLI ER 10-8 MG/5ML PO SUER: 5.0000 mL | Freq: Two times a day (BID) | ORAL | 0 refills | Status: DC | PRN

## 2024-08-14 NOTE — Assessment & Plan Note (Signed)
 Reports he does not like the way he feels while taking doxycycline  but cannot really elaborate on how he is feeling.  He would prefer to switch to azithromycin  which I sent over for him.  Will also add Tessalon  Perles as needed.  Encouraged to continue prednisone .  His exam is reassuring.  Red flags reviewed.

## 2024-08-14 NOTE — Progress Notes (Signed)
 Tracy Bennett - 78 y.o. male MRN [id number]  Date of birth: 05/30/1946  Subjective Chief Complaint  Patient presents with   Cough    HPI Tracy Bennett is a 78 year old male here today for follow-up of cough.  He was seen recently by Dr. Alvan for follow-up of his diabetes and noted to have cough and sinus congestion.  He was prescribed doxycycline  as well as prednisone .  He did pick up the medications but never started due to upcoming procedure for ablation to treat his atrial fibrillation.  He did finally start this a couple of days ago due to his procedure being canceled because of his illness.  Does not really care for doxycycline .  Feels like symptoms are worsening since adding this.  Does feel like prednisone  may help some.  Denies chest pain or fever.  He did try Hycodan cough syrup but does not feel like this works very well compared to Tussionex and his insurance did not cover this so he had to pay out-of-pocket.  ROS:  A comprehensive ROS was completed and negative except as noted per HPI    Allergies  Allergen Reactions   Amoxicillin Swelling    Tongue sores   Iodinated Contrast Media Shortness Of Breath and Rash    Patient gets hot and sweating   Lisinopril Rash and Cough    Past Medical History:  Diagnosis Date   Allergy    Arthritis    Community acquired pneumonia of left lower lobe of lung 05/15/2018   Dyspnea    with exertion   Dysrhythmia    frequent PVC's   Frequent urination    GERD (gastroesophageal reflux disease)    History of kidney stones    Hyperlipidemia    Hypertension    Kidney stones    x 4    Sleep apnea    BiPAP    Past Surgical History:  Procedure Laterality Date   CARDIOVERSION N/A 03/17/2024   Procedure: CARDIOVERSION;  Surgeon: Loni Soyla LABOR, MD;  Location: [facility] INVASIVE CV LAB;  Service: Cardiovascular;  Laterality: N/A;   CARDIOVERSION N/A 05/14/2024   Procedure: CARDIOVERSION;  Surgeon: Okey Vina GAILS, MD;  Location: [facility]  INVASIVE CV LAB;  Service: Cardiovascular;  Laterality: N/A;   CARPAL TUNNEL RELEASE     Right    CERVICAL FUSION     cervical fusion times 2   EYE SURGERY     FOOT TENDON SURGERY  11/14/2011   Dr. Harden   FRACTURE SURGERY     HERNIA REPAIR     left knee surgery     ACL repair    LIPOMA EXCISION Bilateral    on back   MASS EXCISION N/A 01/24/2018   Procedure: EXCISION OF BACK MASS;  Surgeon: Vanderbilt Ned, MD;  Location: [facility] OR;  Service: General;  Laterality: N/A;   SPINE SURGERY     UMBILICAL HERNIA REPAIR      Social History   Socioeconomic History   Marital status: Married    Spouse name: Tracy Bennett    Number of children: 1   Years of education: 14   Highest education level: Associate degree: occupational, Scientist, product/process development, or vocational program  Occupational History   Occupation: Retired     Comment: [employer]   Tobacco Use   Smoking status: Former    Current packs/day: 0.00    Types: Cigarettes    Quit date: 02/09/1980    Years since quitting: 44.5   Smokeless tobacco: Former   Tobacco comments:  Former smoker 04/14/24  Vaping Use   Vaping status: Never Used  Substance and Sexual Activity   Alcohol use: Yes    Comment: occasionally   Drug use: No   Sexual activity: Yes  Other Topics Concern   Not on file  Social History Narrative   Lives with your wife. He has one child. He enjoys singing and yard work.   Social Drivers of Corporate investment banker Strain: Low Risk  (08/01/2024)   Overall Financial Resource Strain (CARDIA)    Difficulty of Paying Living Expenses: Not hard at all  Food Insecurity: No Food Insecurity (08/01/2024)   Hunger Vital Sign    Worried About Running Out of Food in the Last Year: Never true    Ran Out of Food in the Last Year: Never true  Transportation Needs: No Transportation Needs (08/01/2024)   PRAPARE - Administrator, Civil Service (Medical): No    Lack of Transportation (Non-Medical): No  Physical Activity: Sufficiently  Active (08/01/2024)   Exercise Vital Sign    Days of Exercise per Week: 5 days    Minutes of Exercise per Session: 60 min  Stress: No Stress Concern Present (08/01/2024)   [employer] of Occupational Health - Occupational Stress Questionnaire    Feeling of Stress: Not at all  Social Connections: Socially Integrated (08/01/2024)   Social Connection and Isolation Panel    Frequency of Communication with Friends and Family: Three times a week    Frequency of Social Gatherings with Friends and Family: More than three times a week    Attends Religious Services: More than 4 times per year    Active Member of Clubs or Organizations: Yes    Attends Engineer, structural: More than 4 times per year    Marital Status: Married    Family History  Problem Relation Age of Onset   COPD Mother    Hypertension Mother    Emphysema Mother     Health Maintenance  Topic Date Due   OPHTHALMOLOGY EXAM  05/22/2024   COVID-19 Vaccine (6 - 2025-26 season) 07/28/2024   Influenza Vaccine  02/24/2025 (Originally 06/27/2024)   Medicare Annual Wellness (AWV)  11/18/2024   HEMOGLOBIN A1C  02/01/2025   Diabetic kidney evaluation - eGFR measurement  08/04/2025   Diabetic kidney evaluation - Urine ACR  08/04/2025   FOOT EXAM  08/04/2025   DTaP/Tdap/Td (3 - Td or Tdap) 07/10/2033   Pneumococcal Vaccine: 50+ Years  Completed   Hepatitis C Screening  Completed   Zoster Vaccines- Shingrix  Completed   HPV VACCINES  Aged Out   Meningococcal B Vaccine  Aged Out   Colonoscopy  Discontinued     ----------------------------------------------------------------------------------------------------------------------------------------------------------------------------------------------------------------- Physical Exam BP 133/74 (BP Location: Left Arm, Patient Position: Sitting, Cuff Size: Large)   Pulse 80   Ht 5' 10 (1.778 m)   Wt 279 lb (126.6 kg)   SpO2 97%   BMI 40.03 kg/m   Physical  Exam Constitutional:      Appearance: Normal appearance.  HENT:     Head: Normocephalic and atraumatic.  Eyes:     General: No scleral icterus. Cardiovascular:     Rate and Rhythm: Normal rate and regular rhythm.  Pulmonary:     Effort: Pulmonary effort is normal.     Breath sounds: Normal breath sounds.  Musculoskeletal:     Cervical back: Neck supple.  Neurological:     Mental Status: He is alert.  Psychiatric:  Mood and Affect: Mood normal.        Behavior: Behavior normal.     ------------------------------------------------------------------------------------------------------------------------------------------------------------------------------------------------------------------- Assessment and Plan  Sinobronchitis Reports he does not like the way he feels while taking doxycycline  but cannot really elaborate on how he is feeling.  He would prefer to switch to azithromycin  which I sent over for him.  Will also add Tessalon  Perles as needed.  Encouraged to continue prednisone .  His exam is reassuring.  Red flags reviewed.   Meds ordered this encounter  Medications   chlorpheniramine-HYDROcodone  (TUSSIONEX) 10-8 MG/5ML    Sig: Take 5 mLs by mouth every 12 (twelve) hours as needed for cough (cough, will cause drowsiness.).    Dispense:  120 mL    Refill:  0   benzonatate  (TESSALON ) 200 MG capsule    Sig: Take 1 capsule (200 mg total) by mouth 2 (two) times daily as needed for cough.    Dispense:  20 capsule    Refill:  0   azithromycin  (ZITHROMAX ) 250 MG tablet    Sig: Take 2 tablets on day 1, then 1 tablet daily on days 2 through 5    Dispense:  6 tablet    Refill:  0    No follow-ups on file.

## 2024-08-14 NOTE — Telephone Encounter (Addendum)
 Pt decided to take an appt today with Dr. Alvia to discuss plan of care today.   FYI Only or Action Required?: FYI only for provider.  Patient was last seen in primary care on 08/04/2024 by Alvan Dorothyann BIRCH, MD.  Called Nurse Triage reporting Cough.  Symptoms began today.  Interventions attempted: Prescription medications: sinus infection medication.  Symptoms are: gradually worsening.  Triage Disposition: See PCP When Office is Open (Within 3 Days)  Patient/caregiver understands and will follow disposition?: No, wishes to speak with PCP    Copied from CRM [id number]. Topic: Clinical - Medical Advice >> Aug 14, 2024  1:51 PM Susanna ORN wrote: Reason for CRM: Patient calling in stating that he saw Dr. Alvan a few days ago. States he still has this constant cough. He was suppose to have a heart ablation but the anesthesiologist came in and noticed the cough and his chest & told him that they wouldn't put him to sleep and cancelled the procedure. Patient states he was put on doxycycline  & prednisone  but he thinks the doxycycline  is not working for him. He's never taken it before and is use to prednisone . States he thinks it's irritating his esophagus and states it feels like something is stuck in his throat. Was also prescribed cough medication that he had to pay out of pocket for. Wants to know what else could he take? Reason for Disposition  Cough has been present for > 3 weeks  Answer Assessment - Initial Assessment Questions 1. ONSET: When did the cough begin?      Ongoing and worsening 2. SEVERITY: How bad is the cough today?      moderate 3. SPUTUM: Describe the color of your sputum (e.g., none, dry cough; clear, white, yellow, green)     yellow 4. HEMOPTYSIS: Are you coughing up any blood? If Yes, ask: How much? (e.g., flecks, streaks, tablespoons, etc.)     no 5. DIFFICULTY BREATHING: Are you having difficulty breathing? If Yes, ask: How bad is it? (e.g.,  mild, moderate, severe)      no 6. FEVER: Do you have a fever? If Yes, ask: What is your temperature, how was it measured, and when did it start?     no 7. CARDIAC HISTORY: Do you have any history of heart disease? (e.g., heart attack, congestive heart failure)      heart 8. LUNG HISTORY: Do you have any history of lung disease?  (e.g., pulmonary embolus, asthma, emphysema)     na 9. PE RISK FACTORS: Do you have a history of blood clots? (or: recent major surgery, recent prolonged travel, bedridden)     na 10. OTHER SYMPTOMS: Do you have any other symptoms? (e.g., runny nose, wheezing, chest pain)       no 11. PREGNANCY: Is there any chance you are pregnant? When was your last menstrual period?       na 12. TRAVEL: Have you traveled out of the country in the last month? (e.g., travel history, exposures)       Na Pt states he cough becomes worse after taking the prescribed medication.. cough becomes so bad that the patient looses he breath.  Pt feels like something is stuck in his throat/irritation.  Pt thinks the doxycycline  may be causing the throat irritation.  Please call pt to discuss plan of care, possible change in ABX, and prescribe different cough medication because pt insurance does not cover.  Protocols used: Cough - Acute Productive-A-AH

## 2024-08-14 NOTE — Telephone Encounter (Signed)
 Patient seen in office today.

## 2024-08-15 ENCOUNTER — Ambulatory Visit: Payer: Self-pay

## 2024-08-15 DIAGNOSIS — E119 Type 2 diabetes mellitus without complications: Secondary | ICD-10-CM | POA: Diagnosis not present

## 2024-08-15 DIAGNOSIS — Z79899 Other long term (current) drug therapy: Secondary | ICD-10-CM | POA: Diagnosis not present

## 2024-08-15 DIAGNOSIS — E785 Hyperlipidemia, unspecified: Secondary | ICD-10-CM | POA: Diagnosis not present

## 2024-08-15 DIAGNOSIS — Z7985 Long-term (current) use of injectable non-insulin antidiabetic drugs: Secondary | ICD-10-CM | POA: Diagnosis not present

## 2024-08-15 DIAGNOSIS — K219 Gastro-esophageal reflux disease without esophagitis: Secondary | ICD-10-CM | POA: Diagnosis not present

## 2024-08-15 DIAGNOSIS — I4891 Unspecified atrial fibrillation: Secondary | ICD-10-CM | POA: Diagnosis not present

## 2024-08-15 DIAGNOSIS — Z7901 Long term (current) use of anticoagulants: Secondary | ICD-10-CM | POA: Diagnosis not present

## 2024-08-15 DIAGNOSIS — I10 Essential (primary) hypertension: Secondary | ICD-10-CM | POA: Diagnosis not present

## 2024-08-15 DIAGNOSIS — S81801A Unspecified open wound, right lower leg, initial encounter: Secondary | ICD-10-CM | POA: Diagnosis not present

## 2024-08-15 DIAGNOSIS — S80811A Abrasion, right lower leg, initial encounter: Secondary | ICD-10-CM | POA: Diagnosis not present

## 2024-08-15 DIAGNOSIS — Z87891 Personal history of nicotine dependence: Secondary | ICD-10-CM | POA: Diagnosis not present

## 2024-08-15 DIAGNOSIS — S91001A Unspecified open wound, right ankle, initial encounter: Secondary | ICD-10-CM | POA: Diagnosis not present

## 2024-08-15 NOTE — Telephone Encounter (Signed)
 FYI Only or Action Required?: Action required by provider: update on patient condition.  Patient was last seen in primary care on 08/14/2024 by Alvia Bring, DO.  Called Nurse Triage reporting Laceration.  Symptoms began today.  Interventions attempted: CONTROLLED WITH DIRECT PRESSURE NOW.  Symptoms are: RE OPENED.  Triage Disposition: Go to ED Now (Notify PCP)-TO UC  Patient/caregiver understands and will follow disposition?: Yes   Copied from CRM [id number]. Topic: Clinical - Red Word Triage >> Aug 15, 2024  1:04 PM Cherylann RAMAN wrote: Red Word that prompted transfer to Nurse Triage: Patient cut an artery  and they went to the ER and they put glue on it and sent him him, However, it has popped back open and is bleeding every where. Reason for Disposition  [1] Bleeding AND [2] won't stop after 10 minutes of direct pressure (using correct technique)  Answer Assessment - Initial Assessment Questions 1. APPEARANCE of INJURY: What does the injury look like?      Bleeding, controlled with direct pressure at time of call 2. ONSET: How long ago did the injury occur?      Two days ago, re opened today 3. LOCATION: Where is the injury located?      Back of right lower extremity 4. SIZE: How large is the cut?      puncture 5. BLEEDING: Is it bleeding now? If Yes, ask: Is it difficult to stop?      Bleeding on elequis 6. PAIN: Is there any pain? If Yes, ask: How bad is the pain? (Scale 0-10; or none, mild, moderate, severe)     denies  Protocols used: Cuts and Lacerations-A-AH

## 2024-08-17 NOTE — H&P (Signed)
 Procedure cancelled by anesthesia due to presence of cough. Will reschedule his AF ablation.   Fonda Kitty, MD, [facility], [facility]

## 2024-08-19 ENCOUNTER — Ambulatory Visit: Payer: Self-pay

## 2024-08-19 NOTE — Telephone Encounter (Signed)
 FYI Only or Action Required?: FYI only for provider.  Patient was last seen in primary care on 08/14/2024 by Alvia Bring, DO.  Called Nurse Triage reporting Cough.  Symptoms began several weeks ago.  Interventions attempted: Prescription medications: Z-pak and prednisone .  Symptoms are: gradually improving.  Triage Disposition: See PCP When Office is Open (Within 3 Days)  Patient/caregiver understands and will follow disposition?: Yes                             Copied from CRM #[id number]. Topic: Clinical - Red Word Triage >> Aug 19, 2024  4:42 PM Mercer PEDLAR wrote: Red Word that prompted transfer to Nurse Triage: worsening cough, gets worse when laying down. Reason for Disposition  [1] Finished taking antibiotics AND [2] symptoms are BETTER but [3] not completely gone  Answer Assessment - Initial Assessment Questions 1. INFECTION: What infection is the antibiotic being given for?     Cough, initially treated as a sinus infection 2. ANTIBIOTIC: What antibiotic are you taking How many times per day?     Z-pak 3. DURATION: When was the antibiotic started?     08/14/24 4. MAIN CONCERN OR SYMPTOM:  What is your main concern right now?     Persistent cough 5. BETTER-SAME-WORSE: Are you getting better, staying the same, or getting worse compared to when you first started the antibiotics? If getting worse, ask: In what way?      Better, but still present  6. FEVER: Do you have a fever? If Yes, ask: What is your temperature, how was it measured, and when did it start?     Denies 7. SYMPTOMS: Are there any other symptoms you're concerned about? If Yes, ask: When did it start?     Sputum is yellowish in color, denies wheezing, denies difficulty breathing, denies chest pain  Protocols used: Infection on Antibiotic Follow-up Call-A-AH

## 2024-08-20 ENCOUNTER — Encounter: Payer: Self-pay | Admitting: Family Medicine

## 2024-08-20 ENCOUNTER — Ambulatory Visit (INDEPENDENT_AMBULATORY_CARE_PROVIDER_SITE_OTHER): Admitting: Family Medicine

## 2024-08-20 VITALS — BP 119/59 | HR 78 | Ht 70.0 in | Wt 279.0 lb

## 2024-08-20 DIAGNOSIS — Z23 Encounter for immunization: Secondary | ICD-10-CM

## 2024-08-20 DIAGNOSIS — R052 Subacute cough: Secondary | ICD-10-CM

## 2024-08-20 DIAGNOSIS — I83899 Varicose veins of unspecified lower extremities with other complications: Secondary | ICD-10-CM

## 2024-08-20 MED ORDER — HYDROCOD POLI-CHLORPHE POLI ER 10-8 MG/5ML PO SUER: 5.0000 mL | Freq: Two times a day (BID) | ORAL | 0 refills | Status: DC | PRN

## 2024-08-20 NOTE — Telephone Encounter (Signed)
 Patient seen 08/20/2024 in office

## 2024-08-20 NOTE — Progress Notes (Signed)
 Acute Office Visit  Subjective:     Patient ID: Tracy Bennett, male    DOB: 1946/10/28, 78 y.o.   MRN: [id number]  Chief Complaint  Patient presents with   Cough    HPI Patient is in today for Cough   Discussed the use of AI scribe software for clinical note transcription with the patient, who gave verbal consent to proceed.   He is here today to follow-up on his cough.  He does feel like overall he is feeling better.  He was seen initially and diagnosed with more viral upper respiratory illness.  He reached out as he was not feeling better and so we had sent in antibiotics he ended up delaying the start of those because he thought it might interfere with him being able to get an ablation for his heart which was prescheduled.  Then when we he went in for the appointment they actually ended up having to cancel the procedure.  And him he did start the antibiotics and prednisone  after that.  He was not feeling tremendously better so he came in and saw one of my partners who switched him to the azithromycin  and also gave him some cough medicines and cough syrup.  He said the initial cough medicine that we had prescribed which was Hycodan was not covered by his insurance but that the Tussionex was.  No fever or chills.  He says yesterday he coughed a lot but so far today he actually feels like it is better.  He feels like maybe things are shifting in the right direction.  He was also seen recently in the emergency department for a bleeding varicose vein.  He thinks he had used his other foot to kind of rub his right inner ankle and that broke the vein.  He said it bled excessively he has never seen so much bleeding.  They ended up calling EMS because he could not get the bleeding to slow down and did go to the hospital.  That seems to be healing and resolving.   He did want to go ahead and get his flu shot done today.  History of Present Illness Tracy Bennett is a 78 year old male with a  history of bleeding issues who presents with a severe bleeding episode from his leg.  Hemorrhagic episode and bleeding tendency - Severe bleeding episode from the leg after scratching with the opposite foot - Profuse bleeding, soaking through Kleenex and paper towels - Subjective sensation of losing 'a gallon' of blood - Associated symptoms of weakness and near-syncope - Emergency services called; transported to hospital and admitted for two weeks - Currently on a blood thinner taken twice daily - Minor nicks result in significant bleeding  Cough and upper respiratory symptoms - Persistent cough and sinus infection led to cancellation of scheduled ablation - Cough improved; no coughing after going to bed last night - Cough associated with mucus in the throat, with increased ability to expectorate recently, providing some relief - Hydrocodone  syrup used for cough with minimal effectiveness - Recent course of doxycycline  completed; currently prescribed azithromycin  - History of sinus infections - Allergic to amoxicillin, limiting antibiotic options - Using nasal prescription nasal spray for sinus symptoms  Medication use - Currently taking amiodarone  for heart rhythm control - On Farxiga  and Mounjaro  - Delayed initiation of prednisone , cough medicine, and doxycycline  due to planned procedure   ROS      Objective:    BP (!) 119/59  Pulse 78   Ht 5' 10 (1.778 m)   Wt 279 lb (126.6 kg)   SpO2 95%   BMI 40.03 kg/m    Physical Exam Vitals and nursing note reviewed.  Constitutional:      Appearance: Normal appearance.  HENT:     Head: Normocephalic and atraumatic.  Eyes:     Conjunctiva/sclera: Conjunctivae normal.  Cardiovascular:     Rate and Rhythm: Normal rate and regular rhythm.  Pulmonary:     Effort: Pulmonary effort is normal.     Breath sounds: Normal breath sounds.  Skin:    General: Skin is warm and dry.  Neurological:     Mental Status: He is alert.   Psychiatric:        Mood and Affect: Mood normal.     No results found for any visits on 08/20/24.      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 Cough with mucus production, resistant to hydrocodone  syrup. Nasal congestion and drainage contribute to symptoms. - Refill hydrocodone -based cough syrup. - Recommend nasal saline irrigation. - Advise prescription nasal spray use after irrigation.   Ablation procedure rescheduling Cough and sinusitis must resolve before ablation. Discussed medication cessation prior to procedure. - Maintain PT appointment on October 14th unless ablation is rescheduled. - Monitor for new ablation date and adjust PT appointment. - Stop Mounjaro  a week prior and Farxiga  three days prior to ablation.  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 Discussed management strategies for future episodes. - Educate on limb elevation to reduce bleeding. - Advise compression with gauze or washcloth and tight bandaging. - Recommend cold packs to constrict vessels and slow bleeding.   Meds ordered this encounter  Medications   chlorpheniramine-HYDROcodone  (TUSSIONEX) 10-8 MG/5ML    Sig: Take 5 mLs by mouth every 12 (twelve) hours as needed for cough (cough, will cause drowsiness.).    Dispense:  120 mL    Refill:  0    Return if symptoms worsen or fail to improve.  Dorothyann Byars, MD  I spent 25 minutes on the day of the encounter to include pre-visit record review, face-to-face time with the patient and post visit ordering of test.

## 2024-08-27 ENCOUNTER — Telehealth: Payer: Self-pay

## 2024-08-27 NOTE — Telephone Encounter (Signed)
 Pt's Afib Ablation has been rescheduled. He is scheduled for 12/31 at 12:30 pm with Dr. Kennyth.  He will see Renee on 12/17 and get labs done that day.   I went over basic instructions with pt and his Wife over the phone. I will send Instruction letter via MyChart and mail a copy - address verified.   Pt was concerned about taking Amiodarone  until December and had me ask Dr. Kennyth - I called pt back and informed him that per Dr. Kennyth it was ok to continue taking until his procedure in December.   They have been put on the wait list and would like to be moved up sooner if possible. They will be out of town 10/5-12.  Pt takes Mounjaro  and Farxiga .

## 2024-08-29 ENCOUNTER — Other Ambulatory Visit: Payer: Self-pay | Admitting: Family Medicine

## 2024-09-01 ENCOUNTER — Other Ambulatory Visit: Payer: Self-pay | Admitting: Family Medicine

## 2024-09-08 ENCOUNTER — Other Ambulatory Visit: Payer: Self-pay | Admitting: Internal Medicine

## 2024-09-08 NOTE — Telephone Encounter (Signed)
 Prescription refill request for Eliquis  received. Indication: AF Last office visit: 05/27/24  JINNY Kitty MD Scr: 1.33 on 08/04/24  Epic Age: 78 Weight: 130.2kg  Based on above findings Eliquis  5mg  twice daily is the appropriate dose.  Refill approved.

## 2024-09-09 ENCOUNTER — Ambulatory Visit (HOSPITAL_COMMUNITY): Admitting: Physician Assistant

## 2024-09-15 ENCOUNTER — Other Ambulatory Visit: Payer: Self-pay | Admitting: Family Medicine

## 2024-09-15 DIAGNOSIS — E785 Hyperlipidemia, unspecified: Secondary | ICD-10-CM

## 2024-09-15 MED ORDER — SIMVASTATIN 40 MG PO TABS: ORAL_TABLET | ORAL | 0 refills | Status: DC

## 2024-09-15 NOTE — Telephone Encounter (Signed)
 Copied from CRM #[id number]. Topic: Clinical - Medication Refill >> Sep 15, 2024  8:39 AM Aleatha C wrote: Medication: simvastatin  (ZOCOR ) 40 MG tablet  Has the patient contacted their pharmacy? Yes (Agent: If no, request that the patient contact the pharmacy for the refill. If patient does not wish to contact the pharmacy document the reason why and proceed with request.) (Agent: If yes, when and what did the pharmacy advise?)  This is the patient's preferred pharmacy:  [facility][id number] - [place], [place] - [address] AT [facility] OF [address] [place] [address] Phone: [phone] Fax: [phone]  Is this the correct pharmacy for this prescription? Yes If no, delete pharmacy and type the correct one.   Has the prescription been filled recently? No  Is the patient out of the medication? Yes  Has the patient been seen for an appointment in the last year OR does the patient have an upcoming appointment? Yes  Can we respond through MyChart? No  Agent: Please be advised that Rx refills may take up to 3 business days. We ask that you follow-up with your pharmacy.

## 2024-09-16 NOTE — Telephone Encounter (Signed)
 This task has been completed as requested. Left a vm msg for the patient regarding the request.  Direct call back information provided. No further action needed.    The pharmacy did confirm that the rx is available for pick up today.

## 2024-09-16 NOTE — Telephone Encounter (Unsigned)
 Copied from CRM [id number]. Topic: Clinical - Prescription Issue >> Sep 16, 2024  9:48 AM Wess RAMAN wrote: Reason for CRM: Patient has not received his simvastatin  (ZOCOR ) 40 MG tablet. He stated [facility] has been trying to contact the clinic and have been unsuccessful.  [facility] also contacted the patient and asked why he has not gotten his medication filled  Callback #: [id number] or [id number]  Pharmacy: [facility] [phone] - [place], [place] - [address] AT [facility] OF [address] [place] [address] Phone: [phone] Fax: [phone] Hours: Not open 24 hours

## 2024-10-02 ENCOUNTER — Telehealth: Payer: Self-pay | Admitting: Cardiology

## 2024-10-02 DIAGNOSIS — I4819 Other persistent atrial fibrillation: Secondary | ICD-10-CM

## 2024-10-02 NOTE — Telephone Encounter (Signed)
 Patient would like to know if ablation can be moved up to a sooner date.

## 2024-10-02 NOTE — Telephone Encounter (Signed)
 Spoke with the patient and advised that ablation could be moved up to 11/12. Patient is in agreement. He will get updated labs. Will send letter through MyChart.

## 2024-10-03 DIAGNOSIS — I4819 Other persistent atrial fibrillation: Secondary | ICD-10-CM | POA: Diagnosis not present

## 2024-10-03 LAB — CBC
Hematocrit: 45.1 % (ref 37.5–51.0)
Hemoglobin: 14.7 g/dL (ref 13.0–17.7)
MCH: 29.8 pg (ref 26.6–33.0)
MCHC: 32.6 g/dL (ref 31.5–35.7)
MCV: 91 fL (ref 79–97)
Platelets: 163 x10E3/uL (ref 150–450)
RBC: 4.94 x10E6/uL (ref 4.14–5.80)
RDW: 13.6 % (ref 11.6–15.4)
WBC: 4.5 x10E3/uL (ref 3.4–10.8)

## 2024-10-03 LAB — BASIC METABOLIC PANEL WITH GFR
BUN/Creatinine Ratio: 8 — ABNORMAL LOW (ref 10–24)
BUN: 11 mg/dL (ref 8–27)
CO2: 22 mmol/L (ref 20–29)
Calcium: 9.2 mg/dL (ref 8.6–10.2)
Chloride: 100 mmol/L (ref 96–106)
Creatinine, Ser: 1.37 mg/dL — ABNORMAL HIGH (ref 0.76–1.27)
Glucose: 131 mg/dL — ABNORMAL HIGH (ref 70–99)
Potassium: 3.6 mmol/L (ref 3.5–5.2)
Sodium: 136 mmol/L (ref 134–144)
eGFR: 53 mL/min/1.73 — ABNORMAL LOW (ref >59)

## 2024-10-04 ENCOUNTER — Ambulatory Visit: Payer: Self-pay | Admitting: Cardiology

## 2024-10-08 ENCOUNTER — Ambulatory Visit (HOSPITAL_COMMUNITY): Admitting: Anesthesiology

## 2024-10-08 ENCOUNTER — Ambulatory Visit (HOSPITAL_COMMUNITY)
Admission: RE | Disposition: A | Discharge status: Home / Self Care | Payer: Self-pay | Source: Home / Self Care | Attending: Cardiology

## 2024-10-08 ENCOUNTER — Encounter (HOSPITAL_COMMUNITY): Payer: Self-pay | Admitting: Cardiology

## 2024-10-08 ENCOUNTER — Other Ambulatory Visit: Payer: Self-pay

## 2024-10-08 ENCOUNTER — Ambulatory Visit (HOSPITAL_COMMUNITY)
Admission: RE | Admit: 2024-10-08 | Discharge: 2024-10-08 | Disposition: A | Discharge status: Home / Self Care | Attending: Cardiology | Admitting: Cardiology

## 2024-10-08 DIAGNOSIS — K5900 Constipation, unspecified: Secondary | ICD-10-CM | POA: Diagnosis not present

## 2024-10-08 DIAGNOSIS — G4733 Obstructive sleep apnea (adult) (pediatric): Secondary | ICD-10-CM | POA: Diagnosis not present

## 2024-10-08 DIAGNOSIS — I5030 Unspecified diastolic (congestive) heart failure: Secondary | ICD-10-CM | POA: Diagnosis not present

## 2024-10-08 DIAGNOSIS — Z6838 Body mass index (BMI) 38.0-38.9, adult: Secondary | ICD-10-CM | POA: Diagnosis not present

## 2024-10-08 DIAGNOSIS — N183 Chronic kidney disease, stage 3 unspecified: Secondary | ICD-10-CM | POA: Diagnosis not present

## 2024-10-08 DIAGNOSIS — I10 Essential (primary) hypertension: Secondary | ICD-10-CM | POA: Diagnosis not present

## 2024-10-08 DIAGNOSIS — I4891 Unspecified atrial fibrillation: Secondary | ICD-10-CM

## 2024-10-08 DIAGNOSIS — I4819 Other persistent atrial fibrillation: Secondary | ICD-10-CM | POA: Diagnosis not present

## 2024-10-08 DIAGNOSIS — D6869 Other thrombophilia: Secondary | ICD-10-CM | POA: Diagnosis not present

## 2024-10-08 DIAGNOSIS — E785 Hyperlipidemia, unspecified: Secondary | ICD-10-CM | POA: Diagnosis not present

## 2024-10-08 DIAGNOSIS — G4730 Sleep apnea, unspecified: Secondary | ICD-10-CM

## 2024-10-08 DIAGNOSIS — E1122 Type 2 diabetes mellitus with diabetic chronic kidney disease: Secondary | ICD-10-CM | POA: Diagnosis not present

## 2024-10-08 DIAGNOSIS — I5032 Chronic diastolic (congestive) heart failure: Secondary | ICD-10-CM | POA: Insufficient documentation

## 2024-10-08 DIAGNOSIS — E66813 Obesity, class 3: Secondary | ICD-10-CM | POA: Diagnosis not present

## 2024-10-08 DIAGNOSIS — Z7985 Long-term (current) use of injectable non-insulin antidiabetic drugs: Secondary | ICD-10-CM | POA: Insufficient documentation

## 2024-10-08 DIAGNOSIS — Z87891 Personal history of nicotine dependence: Secondary | ICD-10-CM

## 2024-10-08 DIAGNOSIS — Z7984 Long term (current) use of oral hypoglycemic drugs: Secondary | ICD-10-CM | POA: Diagnosis not present

## 2024-10-08 DIAGNOSIS — I110 Hypertensive heart disease with heart failure: Secondary | ICD-10-CM | POA: Diagnosis not present

## 2024-10-08 DIAGNOSIS — E119 Type 2 diabetes mellitus without complications: Secondary | ICD-10-CM | POA: Diagnosis not present

## 2024-10-08 DIAGNOSIS — Z7901 Long term (current) use of anticoagulants: Secondary | ICD-10-CM | POA: Insufficient documentation

## 2024-10-08 DIAGNOSIS — K219 Gastro-esophageal reflux disease without esophagitis: Secondary | ICD-10-CM | POA: Insufficient documentation

## 2024-10-08 DIAGNOSIS — Z79899 Other long term (current) drug therapy: Secondary | ICD-10-CM | POA: Insufficient documentation

## 2024-10-08 DIAGNOSIS — I130 Hypertensive heart and chronic kidney disease with heart failure and stage 1 through stage 4 chronic kidney disease, or unspecified chronic kidney disease: Secondary | ICD-10-CM | POA: Diagnosis not present

## 2024-10-08 HISTORY — PX: ATRIAL FIBRILLATION ABLATION: EP1191

## 2024-10-08 LAB — GLUCOSE, CAPILLARY
Glucose-Capillary: 116 mg/dL — ABNORMAL HIGH (ref 70–99)
Glucose-Capillary: 116 mg/dL — ABNORMAL HIGH (ref 70–99)

## 2024-10-08 LAB — POCT ACTIVATED CLOTTING TIME
Activated Clotting Time: 320 s
Activated Clotting Time: 320 s

## 2024-10-08 MED ORDER — ACETAMINOPHEN 500 MG PO TABS
1000.0000 mg | ORAL_TABLET | Freq: Once | ORAL | Status: AC
  Administered 2024-10-08: 1000 mg via ORAL
  Filled 2024-10-08: qty 2

## 2024-10-08 MED ORDER — HEPARIN SODIUM (PORCINE) 1000 UNIT/ML IJ SOLN
INTRAMUSCULAR | Status: DC | PRN
  Administered 2024-10-08: 1000 [IU] via INTRAVENOUS
  Administered 2024-10-08: 18000 [IU] via INTRAVENOUS

## 2024-10-08 MED ORDER — LIDOCAINE 2% (20 MG/ML) 5 ML SYRINGE
INTRAMUSCULAR | Status: DC | PRN
  Administered 2024-10-08: 100 mg via INTRAVENOUS

## 2024-10-08 MED ORDER — HEPARIN (PORCINE) IN NACL 1000-0.9 UT/500ML-% IV SOLN
INTRAVENOUS | Status: DC | PRN
Start: 2024-10-08 — End: 2024-10-08
  Administered 2024-10-08 (×3): 500 mL

## 2024-10-08 MED ORDER — ONDANSETRON HCL 4 MG/2ML IJ SOLN
4.0000 mg | Freq: Four times a day (QID) | INTRAMUSCULAR | Status: DC | PRN
Start: 2024-10-08 — End: 2024-10-08

## 2024-10-08 MED ORDER — ACETAMINOPHEN 325 MG PO TABS: 650.0000 mg | ORAL_TABLET | ORAL | Status: DC | PRN

## 2024-10-08 MED ORDER — PROPOFOL 1000 MG/100ML IV EMUL
INTRAVENOUS | Status: AC
  Filled 2024-10-08: qty 100

## 2024-10-08 MED ORDER — PROPOFOL 10 MG/ML IV BOLUS
INTRAVENOUS | Status: DC | PRN
  Administered 2024-10-08: 150 mg via INTRAVENOUS
  Administered 2024-10-08: 20 mg via INTRAVENOUS
  Administered 2024-10-08: 30 mg via INTRAVENOUS

## 2024-10-08 MED ORDER — SODIUM CHLORIDE 0.9 % IV SOLN: 250.0000 mL | INTRAVENOUS | Status: DC | PRN

## 2024-10-08 MED ORDER — ROCURONIUM BROMIDE 10 MG/ML (PF) SYRINGE
PREFILLED_SYRINGE | INTRAVENOUS | Status: DC | PRN
  Administered 2024-10-08: 60 mg via INTRAVENOUS

## 2024-10-08 MED ORDER — ONDANSETRON HCL 4 MG/2ML IJ SOLN
INTRAMUSCULAR | Status: DC | PRN
  Administered 2024-10-08: 4 mg via INTRAVENOUS

## 2024-10-08 MED ORDER — SODIUM CHLORIDE 0.9% FLUSH
3.0000 mL | INTRAVENOUS | Status: DC | PRN
Start: 2024-10-08 — End: 2024-10-08

## 2024-10-08 MED ORDER — SODIUM CHLORIDE 0.9% FLUSH: 3.0000 mL | Freq: Two times a day (BID) | INTRAVENOUS | Status: DC

## 2024-10-08 MED ORDER — HEPARIN SODIUM (PORCINE) 1000 UNIT/ML IJ SOLN
INTRAMUSCULAR | Status: AC
  Filled 2024-10-08: qty 20

## 2024-10-08 MED ORDER — PROPOFOL 500 MG/50ML IV EMUL
INTRAVENOUS | Status: DC | PRN
  Administered 2024-10-08: 100 ug/kg/min via INTRAVENOUS

## 2024-10-08 MED ORDER — PROTAMINE SULFATE 10 MG/ML IV SOLN
INTRAVENOUS | Status: DC | PRN
  Administered 2024-10-08: 35 mg via INTRAVENOUS

## 2024-10-08 MED ORDER — SODIUM CHLORIDE 0.9 % IV SOLN
INTRAVENOUS | Status: DC
Start: 2024-10-08 — End: 2024-10-08

## 2024-10-08 MED ORDER — SUGAMMADEX SODIUM 200 MG/2ML IV SOLN
INTRAVENOUS | Status: DC | PRN
  Administered 2024-10-08: 100 mg via INTRAVENOUS
  Administered 2024-10-08: 200 mg via INTRAVENOUS

## 2024-10-08 MED ORDER — FENTANYL CITRATE (PF) 100 MCG/2ML IJ SOLN
INTRAMUSCULAR | Status: AC
  Filled 2024-10-08: qty 2

## 2024-10-08 MED ORDER — ATROPINE SULFATE 1 MG/10ML IJ SOSY
PREFILLED_SYRINGE | INTRAMUSCULAR | Status: AC
Start: 2024-10-08 — End: 2024-10-08
  Filled 2024-10-08: qty 10

## 2024-10-08 MED ORDER — APIXABAN 5 MG PO TABS
5.0000 mg | ORAL_TABLET | Freq: Once | ORAL | Status: AC
  Administered 2024-10-08: 5 mg via ORAL
  Filled 2024-10-08: qty 1

## 2024-10-08 MED ORDER — DEXAMETHASONE SOD PHOSPHATE PF 10 MG/ML IJ SOLN
INTRAMUSCULAR | Status: DC | PRN
  Administered 2024-10-08: 5 mg via INTRAVENOUS

## 2024-10-08 MED ORDER — PHENYLEPHRINE HCL-NACL 20-0.9 MG/250ML-% IV SOLN
INTRAVENOUS | Status: DC | PRN
Start: 2024-10-08 — End: 2024-10-08
  Administered 2024-10-08: 50 ug/min via INTRAVENOUS

## 2024-10-08 MED ORDER — ATROPINE SULFATE 1 MG/10ML IJ SOSY
PREFILLED_SYRINGE | INTRAMUSCULAR | Status: DC | PRN
  Administered 2024-10-08: 1 mg via INTRAVENOUS

## 2024-10-08 NOTE — Discharge Instructions (Addendum)
 Stop amiodarone in 1 month.  Cardiac Ablation, Care After  This sheet gives you information about how to care for yourself after your procedure. Your health care provider may also give you more specific instructions. If you have problems or questions, contact your health care provider. What can I expect after the procedure? After the procedure, it is common to have: Bruising around your puncture site. Tenderness around your puncture site. Skipped heartbeats. If you had an atrial fibrillation ablation, you may have atrial fibrillation during the first several months after your procedure.  Tiredness (fatigue).  Follow these instructions at home: Puncture site care  Follow instructions from your health care provider about how to take care of your puncture site. Make sure you: If present, leave stitches (sutures), skin glue, or adhesive strips in place. These skin closures may need to stay in place for up to 2 weeks. If adhesive strip edges start to loosen and curl up, you may trim the loose edges. Do not remove adhesive strips completely unless your health care provider tells you to do that. If a large square bandage is present, this may be removed 24 hours after surgery.  Check your puncture site every day for signs of infection. Check for: Redness, swelling, or pain. Fluid or blood. If your puncture site starts to bleed, lie down on your back, apply firm pressure to the area, and contact your health care provider. Warmth. Pus or a bad smell. A pea or marble sized lump/knot at the site is normal and can take up to three months to resolve.  Driving Do not drive for at least 4 days after your procedure or however long your health care provider recommends. (Do not resume driving if you have previously been instructed not to drive for other health reasons.) Do not drive or use heavy machinery while taking prescription pain medicine. Activity Avoid activities that take a lot of effort for at  least 7 days after your procedure. Do not lift anything that is heavier than 5 lb (4.5 kg) for one week.  No sexual activity for 1 week.  Return to your normal activities as told by your health care provider. Ask your health care provider what activities are safe for you. General instructions Take over-the-counter and prescription medicines only as told by your health care provider. Do not use any products that contain nicotine or tobacco, such as cigarettes and e-cigarettes. If you need help quitting, ask your health care provider. You may shower after 24 hours, but Do not take baths, swim, or use a hot tub for 1 week.  Do not drink alcohol for 24 hours after your procedure. Keep all follow-up visits as told by your health care provider. This is important. Contact a health care provider if: You have redness, mild swelling, or pain around your puncture site. You have fluid or blood coming from your puncture site that stops after applying firm pressure to the area. Your puncture site feels warm to the touch. You have pus or a bad smell coming from your puncture site. You have a fever. You have chest pain or discomfort that spreads to your neck, jaw, or arm. You have chest pain that is worse with lying on your back or taking a deep breath. You are sweating a lot. You feel nauseous. You have a fast or irregular heartbeat. You have shortness of breath. You are dizzy or light-headed and feel the need to lie down. You have pain or numbness in the arm or  leg closest to your puncture site. Get help right away if: Your puncture site suddenly swells. Your puncture site is bleeding and the bleeding does not stop after applying firm pressure to the area. These symptoms may represent a serious problem that is an emergency. Do not wait to see if the symptoms will go away. Get medical help right away. Call your local emergency services (911 in the [place] Do not drive yourself to the  hospital. Summary After the procedure, it is normal to have bruising and tenderness at the puncture site in your groin, neck, or forearm. Check your puncture site every day for signs of infection. Get help right away if your puncture site is bleeding and the bleeding does not stop after applying firm pressure to the area. This is a medical emergency. This information is not intended to replace advice given to you by your health care provider. Make sure you discuss any questions you have with your health care provider.

## 2024-10-08 NOTE — Progress Notes (Signed)
 Report received from [facility], pt took Eliquis  as ordered, able to ambulate to bathroom and void, sites are soft, clean,dry and intact.

## 2024-10-08 NOTE — Anesthesia Procedure Notes (Signed)
 Procedure Name: Intubation Date/Time: 10/08/2024 10:56 AM  Performed by: Wynonia Alfonzo LABOR, CRNAPre-anesthesia Checklist: Patient identified, Emergency Drugs available, Suction available and Patient being monitored Patient Re-evaluated:Patient Re-evaluated prior to induction Oxygen Delivery Method: Circle System Utilized Preoxygenation: Pre-oxygenation with 100% oxygen Induction Type: IV induction Ventilation: Mask ventilation without difficulty Laryngoscope Size: Glidescope and 4 Grade View: Grade I Tube type: Oral Tube size: 7.0 mm Number of attempts: 1 Airway Equipment and Method: Stylet and Oral airway Placement Confirmation: ETT inserted through vocal cords under direct vision, positive ETCO2 and breath sounds checked- equal and bilateral Secured at: 22 cm Tube secured with: Tape Dental Injury: Teeth and Oropharynx as per pre-operative assessment

## 2024-10-08 NOTE — Anesthesia Preprocedure Evaluation (Addendum)
 Anesthesia Evaluation  Patient identified by MRN, date of birth, ID band Patient awake    Reviewed: Allergy & Precautions, NPO status , Patient's Chart, lab work & pertinent test results  History of Anesthesia Complications Negative for: history of anesthetic complications  Airway Mallampati: II  TM Distance: >3 FB Neck ROM: Full    Dental no notable dental hx.    Pulmonary sleep apnea and Continuous Positive Airway Pressure Ventilation , former smoker   Pulmonary exam normal        Cardiovascular hypertension, Pt. on medications Normal cardiovascular exam+ dysrhythmias Atrial Fibrillation   TTE 02/28/24: EF 60-65%, mild to moderate RAE     Neuro/Psych negative neurological ROS     GI/Hepatic Neg liver ROS,GERD  Medicated and Controlled,,  Endo/Other  diabetes, Type 2, Oral Hypoglycemic Agents  Class 3 obesity  Renal/GU Renal InsufficiencyRenal disease (Cr 1.37)     Musculoskeletal  (+) Arthritis ,    Abdominal   Peds  Hematology negative hematology ROS (+)   Anesthesia Other Findings   Reproductive/Obstetrics                              Anesthesia Physical Anesthesia Plan  ASA: 3  Anesthesia Plan: General   Post-op Pain Management: Tylenol  PO (pre-op)*   Induction: Intravenous  PONV Risk Score and Plan: 2 and Treatment may vary due to age or medical condition, Ondansetron , Dexamethasone , Propofol  infusion and TIVA  Airway Management Planned: Oral ETT  Additional Equipment: None  Intra-op Plan:   Post-operative Plan: Extubation in OR  Informed Consent: I have reviewed the patients History and Physical, chart, labs and discussed the procedure including the risks, benefits and alternatives for the proposed anesthesia with the patient or authorized representative who has indicated his/her understanding and acceptance.     Dental advisory given  Plan Discussed with:  CRNA  Anesthesia Plan Comments:          Anesthesia Quick Evaluation

## 2024-10-08 NOTE — Transfer of Care (Signed)
 Immediate Anesthesia Transfer of Care Note  Patient: Tracy Bennett  Procedure(s) Performed: ATRIAL FIBRILLATION ABLATION  Patient Location: PACU  Anesthesia Type:General  Level of Consciousness: awake, alert , and oriented  Airway & Oxygen Therapy: Patient Spontanous Breathing and Patient connected to nasal cannula oxygen  Post-op Assessment: Report given to RN and Post -op Vital signs reviewed and stable  Post vital signs: Reviewed and stable  Last Vitals:  Vitals Value Taken Time  BP    Temp    Pulse 73 10/08/24 12:47  Resp 16 10/08/24 12:47  SpO2 92 % 10/08/24 12:47  Vitals shown include unfiled device data.  Last Pain:  Vitals:   10/08/24 0923  TempSrc:   PainSc: 0-No pain         Complications: There were no known notable events for this encounter.

## 2024-10-08 NOTE — H&P (Signed)
 Electrophysiology Note:   Date:  10/08/24  ID:  Tracy Bennett, DOB 12/05/1945, MRN [id number]   Primary Cardiologist: None Electrophysiologist: Fonda Kitty, MD       History of Present Illness:   Tracy Bennett is a 78 y.o. male with h/o HTN, chronic diastolic heart failure, PVCs, DM, HLD, OSA, atrial fibrillation who is being seen today for evaluation for catheter ablation.   Persistent atrial fibrillation was first identified during an EKG in March 2025. We do not know when exactly AF started. He has not had any episodes of normal rhythm on his personal Kardia mobile device since being informed about his AF. No rapid heart rate or palpitations, but he has increased fatigue and requires more frequent breaks during physical activities compared to previous years. During our last visit, he was started on amiodarone  and underwent successful cardioversion on 6/18.   Discussed the use of AI scribe software for clinical note transcription with the patient, who gave verbal consent to proceed.   History of Present Illness He recently underwent cardioversion, which successfully restored him to normal sinus rhythm. He feels significantly better, with increased energy and no longer experiences shortness of breath. He is currently maintained on amiodarone .He is taking amiodarone , initially prescribed at 400 mg BID and now reduced to 200 mg BID. Since reducing the dose, he has experienced some improvement in bowel movements, which were previously affected by the medication. He also uses Miralax and prune juice to manage constipation.He is on Eliquis  for anticoagulation due to atrial fibrillation. He notes prolonged bleeding from minor cuts while on Eliquis , which is concerning, especially during yard work. No significant bleeding episodes such as blood in stool or urine. He uses a Kardia mobile device to monitor his heart rhythm, which now consistently shows normal sinus rhythm. His heart rate has decreased  from 100-120 bpm to 66 bpm, contributing to his improved energy levels.   His past medical history includes hypertension, obesity, and sleep apnea, which are known risk factors for atrial fibrillation. He is managing his weight and diabetes with Mounjaro  and Farxiga , and takes valsartan  for blood pressure control. He uses CPAP for his OSA.   Interval: Patient presents today for planned ablation. Reports feeling relatively well. No new or acute complaints.    Review of systems complete and found to be negative unless listed in HPI.    EP Information / Studies Reviewed:     EKG Interpretation Date/Time:                  Tuesday May 27 2024 14:12:39 EDT Ventricular Rate:         66 PR Interval:                 172 QRS Duration:             86 QT Interval:                 402 QTC Calculation:421 R Axis:                         51   Text Interpretation:Normal sinus rhythm Nonspecific ST and T wave abnormality When compared with ECG of 14-May-2024 10:04, Premature ventricular complexes are no longer Present Confirmed by Kitty Fonda [phone]) on 05/27/2024 2:24:26 PM    EKG 04/14/24: AF    Echo 02/28/24:   1. Left ventricular ejection fraction, by estimation, is 60 to 65%. The  left ventricle has  normal function. The left ventricle has no regional  wall motion abnormalities. Left ventricular diastolic parameters are  indeterminate.   2. Right ventricular systolic function is normal. The right ventricular  size is normal.   3. Right atrial size was mild to moderately dilated.   4. The mitral valve is normal in structure. No evidence of mitral valve  regurgitation. No evidence of mitral stenosis.   5. The aortic valve is normal in structure. Aortic valve regurgitation is  not visualized. No aortic stenosis is present.   6. The inferior vena cava is normal in size with greater than 50%  respiratory variability, suggesting right atrial pressure of 3 mmHg.      Risk Assessment/Calculations:      CHA2DS2-VASc Score = 5   This indicates a 7.2% annual risk of stroke. The patient's score is based upon: CHF History: 1 HTN History: 1 Diabetes History: 1 Stroke History: 0 Vascular Disease History: 0 Age Score: 2 Gender Score: 0               Physical Exam:    Today's Vitals   10/08/24 0913 10/08/24 0923  BP: (!) 146/71   Pulse: 71   Resp: 17   Temp: 97.7 F (36.5 C)   TempSrc: Oral   SpO2: 97%   Weight: 120.2 kg   Height: 5' 10 (1.778 m)   PainSc:  0-No pain   Body mass index is 38.02 kg/m.   GEN: Well nourished, well developed in no acute distress NECK: No JVD CARDIAC: Normal rate, regular rhythm. RESPIRATORY:  Clear to auscultation without rales, wheezing or rhonchi  ABDOMEN: Soft, non-distended EXTREMITIES:  No edema; No deformity    ASSESSMENT AND PLAN:     #. Persistent atrial fibrillation, symptomatic: History of diastolic heart failure.  For this reason, we pursued a rhythm control strategy.  Patient was loaded on amiodarone  and underwent successful cardioversion on 6/18.  He has been maintaining sinus rhythm since.  He reports feeling significantly better in sinus rhythm. #. Secondary hypercoagulable state due to AF:  -Discussed treatment options today for AF including antiarrhythmic drug therapy and ablation. Discussed risks, recovery and likelihood of success with each treatment strategy. Risk, benefits, and alternatives to EP study and ablation for afib were discussed. These risks include but are not limited to stroke, bleeding, vascular damage, tamponade, perforation, damage to the esophagus, lungs, phrenic nerve and other structures, pulmonary vein stenosis, worsening renal function, coronary vasospasm and death.  Discussed potential need for repeat ablation procedures and antiarrhythmic drugs after an initial ablation. The patient understands these risk and wishes to proceed today. - Tentatively plan to stop amiodarone  1 month after ablation.  -  Continue Eliquis  5mg  BID.   #. Chronic diastolic heart failure: Well compensated today. - Rhythm control as above.  - Continue follow up with general cardiology.    #. OSA:  -Encouraged BiPAP use to reduce AF recurrence.    Follow up with Dr. Kennyth 3 months after ablation.   Signed, Fonda Kennyth, MD

## 2024-10-08 NOTE — Progress Notes (Signed)
 RN at bedside with MD Kennyth, checked groin sites and verified when to restart Eliquis  per MD okay to go home RN helping pt to get dressed for d/c.

## 2024-10-09 ENCOUNTER — Telehealth (HOSPITAL_COMMUNITY): Payer: Self-pay

## 2024-10-09 MED FILL — Propofol IV Emul 1000 MG/100ML (10 MG/ML): INTRAVENOUS | Qty: 146.64 | Status: AC

## 2024-10-09 NOTE — Anesthesia Postprocedure Evaluation (Signed)
 Anesthesia Post Note  Patient: Tracy Bennett  Procedure(s) Performed: ATRIAL FIBRILLATION ABLATION     Patient location during evaluation: PACU Anesthesia Type: General Level of consciousness: awake and alert Pain management: pain level controlled Vital Signs Assessment: post-procedure vital signs reviewed and stable Respiratory status: spontaneous breathing, nonlabored ventilation and respiratory function stable Cardiovascular status: blood pressure returned to baseline Postop Assessment: no apparent nausea or vomiting Anesthetic complications: no   There were no known notable events for this encounter.  Last Vitals:  Vitals:   10/08/24 1600 10/08/24 1700  BP: 137/74 139/71  Pulse: 68 68  Resp: 17 (!) 21  Temp:    SpO2: 90% 92%    Last Pain:  Vitals:   10/08/24 1330  TempSrc: Axillary  PainSc: 0-No pain                 Vertell Row

## 2024-10-09 NOTE — Telephone Encounter (Signed)
 Spoke with patient to complete post procedure follow up call.  Patient reports no complications with groin sites.   Instructions reviewed with patient:  Remove large bandage at puncture site after 24 hours. It is normal to have bruising, tenderness, mild swelling, and a pea or marble sized lump/knot at the groin site which can take up to three months to resolve.  Get help right away if you notice sudden swelling at the puncture site.  Check your puncture site every day for signs of infection: fever, redness, swelling, pus drainage, warmth, foul odor or excessive pain. If this occurs, please call [phone], to speak with the RN Navigator. Get help right away if your puncture site is bleeding and the bleeding does not stop after applying firm pressure to the area.  You may continue to have skipped beats/ atrial fibrillation during the first several months after your procedure.  It is very important not to miss any doses of your blood thinner Eliquis .    You will follow up with the Afib clinic 4 weeks after your procedure and follow up with the APP 3 months after your procedure.  Activity restrictions reviewed.  Patient verbalized understanding to all instructions provided.

## 2024-11-05 ENCOUNTER — Ambulatory Visit (HOSPITAL_COMMUNITY)
Admission: RE | Admit: 2024-11-05 | Discharge: 2024-11-05 | Disposition: A | Discharge status: Home / Self Care | Source: Ambulatory Visit | Attending: Physician Assistant | Admitting: Physician Assistant

## 2024-11-05 VITALS — BP 138/70 | HR 75 | Ht 70.0 in | Wt 260.2 lb

## 2024-11-05 DIAGNOSIS — I4891 Unspecified atrial fibrillation: Secondary | ICD-10-CM | POA: Diagnosis not present

## 2024-11-05 DIAGNOSIS — I4819 Other persistent atrial fibrillation: Secondary | ICD-10-CM | POA: Diagnosis present

## 2024-11-05 DIAGNOSIS — D6869 Other thrombophilia: Secondary | ICD-10-CM | POA: Diagnosis not present

## 2024-11-05 NOTE — Progress Notes (Signed)
 Primary Care Physician: Alvan Dorothyann BIRCH, MD Primary Cardiologist: None Electrophysiologist: Fonda Kitty, MD  Referring Physician: Dr Fernande Cassis Tracy Bennett is a 78 y.o. male with a history of HTN, HFpEF, PVCs, DM, HLD, OSA, atrial fibrillation who presents for follow up in the [facility].  The patient was initially diagnosed with atrial fibrillation 02/11/24 at a visit with Dr Fernande. Patient was unaware of his arrhythmia. He was started on Eliquis  for stroke prevention. Patient underwent DCCV on 03/17/24 which was unsuccessful after 4 shocks. He was seen by Dr Kitty and started on amiodarone . He had repeat DCCV on 05/14/24 which was successful. He underwent afib ablation 10/08/24.    Patient returns for follow up for atrial fibrillation. He remains in SR today and feels well. He stopped amiodarone  shortly after ablation. He reports that he has more energy. His groin cath sites have healed.   Today, he  denies symptoms of palpitations, chest pain, shortness of breath, orthopnea, PND, lower extremity edema, dizziness, presyncope, syncope, bleeding, or neurologic sequela. The patient is tolerating medications without difficulties and is otherwise without complaint today.    Atrial Fibrillation Risk Factors:  he does have symptoms or diagnosis of sleep apnea. he does not have a history of rheumatic fever.   Atrial Fibrillation Management history:  Previous antiarrhythmic drugs: amiodarone   Previous cardioversions: 03/17/24 unsuccessful, 05/14/24 Previous ablations: 10/08/24 Anticoagulation history: Eliquis   ROS- All systems are reviewed and negative except as per the HPI above.  Past Medical History:  Diagnosis Date   Allergy    Arthritis    Community acquired pneumonia of left lower lobe of lung 05/15/2018   Dyspnea    with exertion   Dysrhythmia    frequent PVC's   Frequent urination    GERD (gastroesophageal reflux disease)    History of  kidney stones    Hyperlipidemia    Hypertension    Kidney stones    x 4    Sleep apnea    BiPAP    Current Outpatient Medications  Medication Sig Dispense Refill   Accu-Chek FastClix Lancets MISC Dx DM E11.9 Check fasting blood sugar daily and after largest meal of the day a couple time a week. 102 each 11   Blood Glucose Monitoring Suppl DEVI 1 each by Does not apply route in the morning, at noon, and at bedtime. E11.9, May substitute to any manufacturer covered by patient's insurance. 1 each 0   chlorpheniramine-HYDROcodone  (TUSSIONEX) 10-8 MG/5ML Take 5 mLs by mouth every 12 (twelve) hours as needed for cough (cough, will cause drowsiness.). (Patient taking differently: Take 5 mLs by mouth as needed for cough (cough, will cause drowsiness.).) 120 mL 0   dapagliflozin  propanediol (FARXIGA ) 5 MG TABS tablet Take 1 tablet (5 mg total) by mouth daily. 90 tablet 1   ELIQUIS  5 MG TABS tablet TAKE 1 TABLET(5 MG) BY MOUTH TWICE DAILY 180 tablet 1   finasteride  (PROSCAR ) 5 MG tablet Take 1 tablet (5 mg total) by mouth daily. 30 tablet 1   Fluocinolone Acetonide 0.01 % OIL Place 1 application  in ear(s) daily as needed (Itching ear/Dry).     furosemide  (LASIX ) 40 MG tablet Take 1 tablet (40 mg total) by mouth every other day. (Patient taking differently: Take 40 mg by mouth 2 (two) times a week.) 45 tablet 3   glucose blood (ACCU-CHEK GUIDE TEST) test strip TEST BLOOD SUGAR MORNING, NOON AND AT BEDTIME TIME 300 strip 11  hydrocortisone  2.5 % cream Apply 1 Application topically daily as needed (face).     loratadine (CLARITIN) 10 MG tablet Take 10 mg by mouth daily.     Misc. Devices MISC New bipap machine at current settings.     naproxen  (NAPROSYN ) 500 MG tablet Take 1 tablet (500 mg total) by mouth 2 (two) times daily as needed. 60 tablet 1   oxybutynin (DITROPAN-XL) 10 MG 24 hr tablet Take 10 mg by mouth at bedtime.  3   pantoprazole  (PROTONIX ) 40 MG tablet TAKE 1 TABLET BY MOUTH EVERY DAY 90  tablet 3   simvastatin  (ZOCOR ) 40 MG tablet TAKE 1 TABLET BY MOUTH DAILY AT 6 PM. Needs labs 90 tablet 0   tamsulosin (FLOMAX) 0.4 MG CAPS capsule Take 0.4 mg by mouth at bedtime.     tirzepatide  (MOUNJARO ) 15 MG/0.5ML Pen Inject 15 mg into the skin once a week. 6 mL 3   triamcinolone  cream (KENALOG ) 0.1 % Apply 1 Application topically daily as needed (Hands).     valsartan -hydrochlorothiazide  (DIOVAN -HCT) 80-12.5 MG tablet TAKE 1 TABLET BY MOUTH DAILY 90 tablet 0   No current facility-administered medications for this encounter.    Physical Exam: BP 138/70   Pulse 75   Ht 5' 10 (1.778 m)   Wt 118 kg   BMI 37.33 kg/m   GEN: Well nourished, well developed in no acute distress CARDIAC: Regular rate and rhythm, no murmurs, rubs, gallops RESPIRATORY:  Clear to auscultation without rales, wheezing or rhonchi  ABDOMEN: Soft, non-tender, non-distended EXTREMITIES:  No edema; No deformity    Wt Readings from Last 3 Encounters:  11/05/24 118 kg  10/08/24 120.2 kg  08/20/24 126.6 kg     EKG Interpretation Date/Time:  Wednesday November 05 2024 14:32:09 EST Ventricular Rate:  75 PR Interval:  166 QRS Duration:  86 QT Interval:  384 QTC Calculation: 428 R Axis:   46  Text Interpretation: Normal sinus rhythm ST & T wave abnormality, consider anterior ischemia Abnormal ECG When compared with ECG of 08-Oct-2024 12:57, No significant change was found Confirmed by Leona Pressly ([id number]) on 11/05/2024 2:35:26 PM    Echo 02/28/24 demonstrated   1. Left ventricular ejection fraction, by estimation, is 60 to 65%. The  left ventricle has normal function. The left ventricle has no regional  wall motion abnormalities. Left ventricular diastolic parameters are  indeterminate.   2. Right ventricular systolic function is normal. The right ventricular  size is normal.   3. Right atrial size was mild to moderately dilated.   4. The mitral valve is normal in structure. No evidence of mitral valve   regurgitation. No evidence of mitral stenosis.   5. The aortic valve is normal in structure. Aortic valve regurgitation is  not visualized. No aortic stenosis is present.   6. The inferior vena cava is normal in size with greater than 50%  respiratory variability, suggesting right atrial pressure of 3 mmHg.   Comparison(s): Echocardiogram done 10/17/16 showed an EF of 60-65%.    CHA2DS2-VASc Score = 5  The patient's score is based upon: CHF History: 1 HTN History: 1 Diabetes History: 1 Stroke History: 0 Vascular Disease History: 0 Age Score: 2 Gender Score: 0       ASSESSMENT AND PLAN: Persistent Atrial Fibrillation (ICD10:  I48.19) The patient's CHA2DS2-VASc score is 5, indicating a 7.2% annual risk of stroke.   S/p afib ablation 10/08/24, off amiodarone   Patient appears to be maintaining SR Continue Eliquis  5 mg  BID with no missed doses for 3 months post ablation.  Secondary Hypercoagulable State (ICD10:  D68.69) The patient is at significant risk for stroke/thromboembolism based upon his CHA2DS2-VASc Score of 5.  Continue Apixaban  (Eliquis ). No bleeding issues.   Chronic HFpEF EF 60-65% Fluid status appears stable today  HTN Stable on current regimen  OSA  Encouraged nightly BIPAP  Obesity Body mass index is 37.33 kg/m.  Encouraged lifestyle modification On Mounjaro  He has lost ~80 lbs.    Follow up with Tracy Bennett as scheduled.    Tracy Kicks PA-C [facility] [address] [place], [place] [address] [phone]

## 2024-11-12 ENCOUNTER — Ambulatory Visit: Admitting: Physician Assistant

## 2024-11-24 ENCOUNTER — Encounter: Payer: Self-pay | Admitting: Family Medicine

## 2024-11-24 ENCOUNTER — Ambulatory Visit (INDEPENDENT_AMBULATORY_CARE_PROVIDER_SITE_OTHER): Admitting: Family Medicine

## 2024-11-24 VITALS — BP 131/57 | HR 65 | Ht 70.0 in | Wt 256.0 lb

## 2024-11-24 DIAGNOSIS — N1831 Chronic kidney disease, stage 3a: Secondary | ICD-10-CM | POA: Diagnosis not present

## 2024-11-24 DIAGNOSIS — E1122 Type 2 diabetes mellitus with diabetic chronic kidney disease: Secondary | ICD-10-CM

## 2024-11-24 DIAGNOSIS — Z7984 Long term (current) use of oral hypoglycemic drugs: Secondary | ICD-10-CM

## 2024-11-24 DIAGNOSIS — E118 Type 2 diabetes mellitus with unspecified complications: Secondary | ICD-10-CM | POA: Diagnosis not present

## 2024-11-24 DIAGNOSIS — N1832 Chronic kidney disease, stage 3b: Secondary | ICD-10-CM | POA: Diagnosis not present

## 2024-11-24 DIAGNOSIS — K5903 Drug induced constipation: Secondary | ICD-10-CM | POA: Diagnosis not present

## 2024-11-24 DIAGNOSIS — I4819 Other persistent atrial fibrillation: Secondary | ICD-10-CM | POA: Insufficient documentation

## 2024-11-24 DIAGNOSIS — M533 Sacrococcygeal disorders, not elsewhere classified: Secondary | ICD-10-CM

## 2024-11-24 DIAGNOSIS — Z7985 Long-term (current) use of injectable non-insulin antidiabetic drugs: Secondary | ICD-10-CM | POA: Diagnosis not present

## 2024-11-24 DIAGNOSIS — E785 Hyperlipidemia, unspecified: Secondary | ICD-10-CM

## 2024-11-24 MED ORDER — MELOXICAM 7.5 MG PO TABS: 7.5000 mg | ORAL_TABLET | Freq: Every day | ORAL | 1 refills | Status: AC

## 2024-11-24 NOTE — Assessment & Plan Note (Signed)
 Atrial fibrillation, post ablation, on anticoagulation Post-ablation, currently in normal rhythm. On Eliquis  for anticoagulation. Discussed potential for Zio monitor, but he is not ready. - Continue Eliquis  as prescribed. - Discuss potential for Zio monitor in the future if anticoagulation concerns arise.

## 2024-11-24 NOTE — Assessment & Plan Note (Signed)
 Well-controlled with current medication regimen. A1c levels excellent. Mounjaro  effective but may contribute to decreased appetite and weight loss. - Continue current diabetes management regimen. - Consider reducing Mounjaro  to 12.5 mg after current supply if weight loss and decreased appetite persist.

## 2024-11-24 NOTE — Progress Notes (Signed)
 "  Acute Office Visit  Patient ID: Tracy Bennett, male    DOB: 22-Feb-1946, 78 y.o.   MRN: [id number]  PCP: Alvan Dorothyann BIRCH, MD  Chief Complaint  Patient presents with   Tailbone Pain    Subjective:     HPI  Discussed the use of AI scribe software for clinical note transcription with the patient, who gave verbal consent to proceed.  History of Present Illness Tracy Bennett is a 78 year old male who presents with tailbone pain.  Coccygeal pain - Pain localized to the tailbone area for approximately two weeks - Described as 'in the bone' - Exacerbated by sitting, causing discomfort and difficulty finding a comfortable position - Recent increased physical activity in the yard, including mowing and mulching leaves, suspected as contributing factors - No history of recent falls or direct trauma to the area - Initial trial of Tylenol  without relief - Partial relief with prescription-strength ibuprofen  600 mg - Currently taking wife's meloxicam  7.5 mg twice daily, reducing pain to 3 out of 10 - Utilizes  U-shaped cushions at home for comfort  Unintentional weight loss and altered appetite - Significant weight loss from 342 pounds to 250 pounds - Decreased appetite and changes in taste, possibly related to current medications (Mounjaro  and Farxiga ) - Loss of taste for specific foods, including bread and eggs  Constipation - History of constipation managed with Miralax twice daily - Experienced a hard bowel movement two weeks ago - Currently having regular bowel movements  Cardiac rhythm management - History of ablation procedure - Currently in normal cardiac rhythm  Medication regimen - Currently taking Mounjaro  15 mg, Farxiga , and Eliquis    ROS     Objective:    BP (!) 131/57   Pulse 65   Ht 5' 10 (1.778 m)   Wt 256 lb (116.1 kg)   SpO2 99%   BMI 36.73 kg/m    Physical Exam Vitals reviewed.  Constitutional:      Appearance: Normal appearance.  HENT:      Head: Normocephalic.  Pulmonary:     Effort: Pulmonary effort is normal.  Neurological:     Mental Status: He is alert and oriented to person, place, and time.  Psychiatric:        Mood and Affect: Mood normal.        Behavior: Behavior normal.       No results found for any visits on 11/24/24.     Assessment & Plan:   Problem List Items Addressed This Visit       Cardiovascular and Mediastinum   Atrial fibrillation, persistent (HCC), post ablation   Atrial fibrillation, post ablation, on anticoagulation Post-ablation, currently in normal rhythm. On Eliquis  for anticoagulation. Discussed potential for Zio monitor, but he is not ready. - Continue Eliquis  as prescribed. - Discuss potential for Zio monitor in the future if anticoagulation concerns arise.        Endocrine   Type 2 diabetes mellitus with diabetic chronic kidney disease (HCC)   Well-controlled with current medication regimen. A1c levels excellent. Mounjaro  effective but may contribute to decreased appetite and weight loss. - Continue current diabetes management regimen. - Consider reducing Mounjaro  to 12.5 mg after current supply if weight loss and decreased appetite persist.        Genitourinary   Stage 3b chronic kidney disease (CKD) (HCC)   Relevant Orders   CMP14+EGFR   Lipid panel   CBC   Urine Microalbumin w/creat. ratio   Hemoglobin A1c  Other   Morbid obesity (HCC)   BMI > 35 with comorbidity of DM Continue to work on portion control.       Hyperlipidemia   Relevant Orders   CMP14+EGFR   Lipid panel   CBC   Urine Microalbumin w/creat. ratio   Hemoglobin A1c   Other Visit Diagnoses       Coccygeal pain    -  Primary   Relevant Medications   meloxicam  (MOBIC ) 7.5 MG tablet     Controlled type 2 diabetes mellitus with complication, without long-term current use of insulin (HCC)       Relevant Orders   CMP14+EGFR   Lipid panel   CBC   Urine Microalbumin w/creat. ratio    Hemoglobin A1c     Drug-induced constipation           Assessment and Plan Assessment & Plan Coccyx pain Likely due to repetitive strain from yard work. Differential includes bruised tailbone versus fracture, but fracture is unlikely without trauma. - Continue meloxicam  7.5 mg twice daily with food. - Use U cushions to alleviate pressure when sitting. - Avoid slouched sitting; sit upright. - Consider x-ray if no improvement in a few weeks.  Obesity Significant weight loss achieved, attributed to medication and lifestyle changes. Decreased appetite and taste changes possibly related to Mounjaro . - Encouraged continued weight management efforts. - Discussed potential reduction of Mounjaro  dosage. - Encouraged regular exercise to maintain muscle mass.   Constipation Intermittent constipation, possibly related to medication use. Recent episode resolved with Miralax. - Continue Miralax as needed.    Meds ordered this encounter  Medications   meloxicam  (MOBIC ) 7.5 MG tablet    Sig: Take 1 tablet (7.5 mg total) by mouth daily.    Dispense:  30 tablet    Refill:  1    No follow-ups on file.  Dorothyann Byars, MD [facility] at [facility]   "

## 2024-11-24 NOTE — Assessment & Plan Note (Signed)
 BMI > 35 with comorbidity of DM Continue to work on portion control.

## 2024-11-24 NOTE — Progress Notes (Signed)
 Pt reports that he has been experiencing Tailbone pain. He thinks that this may be from being on his riding lawnmower.   He took IBU 600 which did help but ran out of this. He stated that his wife had some Mobic  7.5 mg which he reports has helped him.

## 2024-11-25 ENCOUNTER — Ambulatory Visit (INDEPENDENT_AMBULATORY_CARE_PROVIDER_SITE_OTHER)

## 2024-11-25 VITALS — BP 132/59 | HR 66 | Ht 70.0 in | Wt 257.0 lb

## 2024-11-25 DIAGNOSIS — Z0000 Encounter for general adult medical examination without abnormal findings: Secondary | ICD-10-CM | POA: Diagnosis not present

## 2024-11-25 NOTE — Patient Instructions (Signed)
 Mr. Burch,  Thank you for taking the time for your Medicare Wellness Visit. I appreciate your continued commitment to your health goals. Please review the care plan we discussed, and feel free to reach out if I can assist you further.  Please note that Annual Wellness Visits do not include a physical exam. Some assessments may be limited, especially if the visit was conducted virtually. If needed, we may recommend an in-person follow-up with your provider.  Ongoing Care Seeing your primary care provider every 3 to 6 months helps us  monitor your health and provide consistent, personalized care.   Referrals If a referral was made during today's visit and you haven't received any updates within two weeks, please contact the referred provider directly to check on the status.  Recommended Screenings:  Health Maintenance  Topic Date Due   COVID-19 Vaccine (6 - 2025-26 season) 07/28/2024   Medicare Annual Wellness Visit  11/18/2024   Hemoglobin A1C  02/01/2025   Eye exam for diabetics  06/02/2025   Yearly kidney health urinalysis for diabetes  08/04/2025   Complete foot exam   08/04/2025   Yearly kidney function blood test for diabetes  10/03/2025   DTaP/Tdap/Td vaccine (3 - Td or Tdap) 07/10/2033   Pneumococcal Vaccine for age over 84  Completed   Flu Shot  Completed   Hepatitis C Screening  Completed   Zoster (Shingles) Vaccine  Completed   Meningitis B Vaccine  Aged Out   Colon Cancer Screening  Discontinued       11/25/2024   10:59 AM  Advanced Directives  Does Patient Have a Medical Advance Directive? Yes  Type of Estate Agent of [place];Living will  Does patient want to make changes to medical advance directive? No - Patient declined    Vision: Annual vision screenings are recommended for early detection of glaucoma, cataracts, and diabetic retinopathy. These exams can also reveal signs of chronic conditions such as diabetes and high blood  pressure.  Dental: Annual dental screenings help detect early signs of oral cancer, gum disease, and other conditions linked to overall health, including heart disease and diabetes.  Please see the attached documents for additional preventive care recommendations.

## 2024-11-25 NOTE — Progress Notes (Signed)
 "  Chief Complaint  Patient presents with   Medicare Wellness     Subjective:   Saturnino Liew is a 78 y.o. male who presents for a Medicare Annual Wellness Visit.  Visit info / Clinical Intake: Medicare Wellness Visit Type:: Subsequent Annual Wellness Visit Persons participating in visit and providing information:: patient Medicare Wellness Visit Mode:: In-person (required for WTM) Interpreter Needed?: No Pre-visit prep was completed: yes AWV questionnaire completed by patient prior to visit?: yes Date:: 11/21/24 Living arrangements:: lives with spouse/significant other Patient's Overall Health Status Rating: good Typical amount of pain: some Does pain affect daily life?: (!) yes Are you currently prescribed opioids?: no  Dietary Habits and Nutritional Risks How many meals a day?: 3 Eats fruit and vegetables daily?: yes Most meals are obtained by: eating out In the last 2 weeks, have you had any of the following?: none Diabetic:: (!) yes Any non-healing wounds?: no How often do you check your BS?: 1 Would you like to be referred to a Nutritionist or for Diabetic Management? : no  Functional Status Activities of Daily Living (to include ambulation/medication): Independent Ambulation: Independent Medication Administration: Independent Home Management (perform basic housework or laundry): Independent Manage your own finances?: yes Primary transportation is: driving Concerns about vision?: no *vision screening is required for WTM* Concerns about hearing?: no  Fall Screening Falls in the past year?: 1 Number of falls in past year: 0 Was there an injury with Fall?: 0 Fall Risk Category Calculator: 1 Patient Fall Risk Level: Low Fall Risk  Fall Risk Patient at Risk for Falls Due to: No Fall Risks Fall risk Follow up: Falls evaluation completed  Home and Transportation Safety: All rugs have non-skid backing?: (!) no All stairs or steps have railings?: yes Grab bars  in the bathtub or shower?: yes Have non-skid surface in bathtub or shower?: yes Good home lighting?: yes Regular seat belt use?: yes Hospital stays in the last year:: no  Cognitive Assessment Difficulty concentrating, remembering, or making decisions? : no Will 6CIT or Mini Cog be Completed: yes What year is it?: 0 points What month is it?: 0 points Give patient an address phrase to remember (5 components): [address] [place], [place] [address] About what time is it?: 0 points Count backwards from 20 to 1: 0 points Say the months of the year in reverse: 0 points Repeat the address phrase from earlier: 0 points 6 CIT Score: 0 points  Advance Directives (For Healthcare) Does Patient Have a Medical Advance Directive?: Yes Does patient want to make changes to medical advance directive?: No - Patient declined Type of Advance Directive: Healthcare Power of [place]; Living will  Reviewed/Updated  Reviewed/Updated: Reviewed All (Medical, Surgical, Family, Medications, Allergies, Care Teams, Patient Goals)    Allergies (verified) Amoxicillin, Iodinated contrast media, and Lisinopril   Current Medications (verified) Outpatient Encounter Medications as of 11/25/2024  Medication Sig   Accu-Chek FastClix Lancets MISC Dx DM E11.9 Check fasting blood sugar daily and after largest meal of the day a couple time a week.   Blood Glucose Monitoring Suppl DEVI 1 each by Does not apply route in the morning, at noon, and at bedtime. E11.9, May substitute to any manufacturer covered by patient's insurance.   dapagliflozin  propanediol (FARXIGA ) 5 MG TABS tablet Take 1 tablet (5 mg total) by mouth daily.   ELIQUIS  5 MG TABS tablet TAKE 1 TABLET(5 MG) BY MOUTH TWICE DAILY   finasteride  (PROSCAR ) 5 MG tablet Take 1 tablet (5 mg total)  by mouth daily.   Fluocinolone Acetonide 0.01 % OIL Place 1 application  in ear(s) daily as needed (Itching ear/Dry).   furosemide  (LASIX ) 40 MG tablet Take 1 tablet (40 mg  total) by mouth every other day. (Patient taking differently: Take 40 mg by mouth 2 (two) times a week.)   glucose blood (ACCU-CHEK GUIDE TEST) test strip TEST BLOOD SUGAR MORNING, NOON AND AT BEDTIME TIME   hydrocortisone  2.5 % cream Apply 1 Application topically daily as needed (face).   loratadine (CLARITIN) 10 MG tablet Take 10 mg by mouth daily.   meloxicam  (MOBIC ) 7.5 MG tablet Take 1 tablet (7.5 mg total) by mouth daily.   Misc. Devices MISC New bipap machine at current settings.   naproxen  (NAPROSYN ) 500 MG tablet Take 1 tablet (500 mg total) by mouth 2 (two) times daily as needed.   oxybutynin (DITROPAN-XL) 10 MG 24 hr tablet Take 10 mg by mouth at bedtime.   pantoprazole  (PROTONIX ) 40 MG tablet TAKE 1 TABLET BY MOUTH EVERY DAY   simvastatin  (ZOCOR ) 40 MG tablet TAKE 1 TABLET BY MOUTH DAILY AT 6 PM. Needs labs   tamsulosin (FLOMAX) 0.4 MG CAPS capsule Take 0.4 mg by mouth at bedtime.   tirzepatide  (MOUNJARO ) 15 MG/0.5ML Pen Inject 15 mg into the skin once a week.   triamcinolone  cream (KENALOG ) 0.1 % Apply 1 Application topically daily as needed (Hands).   valsartan -hydrochlorothiazide  (DIOVAN -HCT) 80-12.5 MG tablet TAKE 1 TABLET BY MOUTH DAILY   No facility-administered encounter medications on file as of 11/25/2024.    History: Past Medical History:  Diagnosis Date   Allergy    Arthritis    Community acquired pneumonia of left lower lobe of lung 05/15/2018   Dyspnea    with exertion   Dysrhythmia    frequent PVC's   Frequent urination    GERD (gastroesophageal reflux disease)    History of kidney stones    Hyperlipidemia    Hypertension    Kidney stones    x 4    Sleep apnea    BiPAP   Past Surgical History:  Procedure Laterality Date   ATRIAL FIBRILLATION ABLATION N/A 10/08/2024   Procedure: ATRIAL FIBRILLATION ABLATION;  Surgeon: Kennyth Chew, MD;  Location: [facility];  Service: Cardiovascular;  Laterality: N/A;   CARDIOVERSION N/A 03/17/2024    Procedure: CARDIOVERSION;  Surgeon: Loni Soyla LABOR, MD;  Location: [facility];  Service: Cardiovascular;  Laterality: N/A;   CARDIOVERSION N/A 05/14/2024   Procedure: CARDIOVERSION;  Surgeon: Okey Vina GAILS, MD;  Location: [facility] INVASIVE CV LAB;  Service: Cardiovascular;  Laterality: N/A;   CARPAL TUNNEL RELEASE     Right    CERVICAL FUSION     cervical fusion times 2   EYE SURGERY     FOOT TENDON SURGERY  11/14/2011   Dr. Harden   FRACTURE SURGERY     HERNIA REPAIR     left knee surgery     ACL repair    LIPOMA EXCISION Bilateral    on back   MASS EXCISION N/A 01/24/2018   Procedure: EXCISION OF BACK MASS;  Surgeon: Vanderbilt Ned, MD;  Location: [facility] OR;  Service: General;  Laterality: N/A;   SPINE SURGERY     UMBILICAL HERNIA REPAIR     Family History  Problem Relation Age of Onset   COPD Mother    Hypertension Mother    Emphysema Mother    Social History   Occupational History   Occupation:  Retired     Comment: [employer]   Tobacco Use   Smoking status: Former    Current packs/day: 0.00    Types: Cigarettes    Quit date: 02/09/1980    Years since quitting: 44.8   Smokeless tobacco: Former   Tobacco comments:    Former smoker 04/14/24  Vaping Use   Vaping status: Never Used  Substance and Sexual Activity   Alcohol use: Yes    Comment: occasionally   Drug use: No   Sexual activity: Yes   Tobacco Counseling Counseling given: Not Answered Tobacco comments: Former smoker 04/14/24  SDOH Screenings   Food Insecurity: No Food Insecurity (11/25/2024)  Housing: Low Risk (11/25/2024)  Transportation Needs: No Transportation Needs (11/25/2024)  Utilities: Not At Risk (11/25/2024)  Alcohol Screen: Low Risk (11/21/2024)  Depression (PHQ2-9): Low Risk (11/25/2024)  Financial Resource Strain: Low Risk (11/21/2024)  Physical Activity: Sufficiently Active (11/25/2024)  Social Connections: Socially Integrated (11/25/2024)  Stress: No Stress Concern Present  (11/25/2024)  Tobacco Use: Medium Risk (11/25/2024)  Health Literacy: Adequate Health Literacy (11/25/2024)   See flowsheets for full screening details  Depression Screen PHQ 2 & 9 Depression Scale- Over the past 2 weeks, how often have you been bothered by any of the following problems? Little interest or pleasure in doing things: 0 Feeling down, depressed, or hopeless (PHQ Adolescent also includes...irritable): 0 PHQ-2 Total Score: 0     Goals Addressed             This Visit's Progress    Patient Stated       Patient states he would like to lose more weight.              Objective:    Today's Vitals   11/25/24 1041  BP: (!) 132/59  Pulse: 66  SpO2: 99%  Weight: 257 lb (116.6 kg)  Height: 5' 10 (1.778 m)   Body mass index is 36.88 kg/m.  Hearing/Vision screen No results found. Immunizations and Health Maintenance Health Maintenance  Topic Date Due   COVID-19 Vaccine (6 - 2025-26 season) 07/28/2024   HEMOGLOBIN A1C  02/01/2025   OPHTHALMOLOGY EXAM  06/02/2025   Diabetic kidney evaluation - Urine ACR  08/04/2025   FOOT EXAM  08/04/2025   Diabetic kidney evaluation - eGFR measurement  10/03/2025   Medicare Annual Wellness (AWV)  11/25/2025   DTaP/Tdap/Td (3 - Td or Tdap) 07/10/2033   Pneumococcal Vaccine: 50+ Years  Completed   Influenza Vaccine  Completed   Hepatitis C Screening  Completed   Zoster Vaccines- Shingrix  Completed   Meningococcal B Vaccine  Aged Out   Colonoscopy  Discontinued        Assessment/Plan:  This is a routine wellness examination for [place].  Patient Care Team: Alvan Dorothyann BIRCH, MD as PCP - General (Family Medicine) Kennyth Chew, MD as PCP - Electrophysiology (Cardiology) Beverley Toribio ORN, MD as Referring Physician (Gastroenterology) Saturnino Marzetta Blumenthal, MD as Referring Physician (Urology) Ryan Francis Barter, MD as Referring Physician (Ophthalmology)  I have personally reviewed and noted the following in  the patients chart:   Medical and social history Use of alcohol, tobacco or illicit drugs  Current medications and supplements including opioid prescriptions. Functional ability and status Nutritional status Physical activity Advanced directives List of other physicians Hospitalizations, surgeries, and ER visits in previous 12 months Vitals Screenings to include cognitive, depression, and falls Referrals and appointments  No orders of the defined types were placed in this encounter.  In addition, I have reviewed and discussed with patient certain preventive protocols, quality metrics, and best practice recommendations. A written personalized care plan for preventive services as well as general preventive health recommendations were provided to patient.   Bonny Jon Mayor, CMA   11/25/2024   Return in 1 year (on 11/25/2025).  After Visit Summary: (In Person-Declined) Patient declined AVS at this time.  Nurse Notes:   Imari Sivertsen is a 78 y.o. male patient of Dorothyann Byars, MD who had a Medicare Annual Wellness Visit today. Yuvraj lives with his wife. He reports that he is socially active and does interact with friends/family regularly. He is moderately physically active.   "

## 2024-12-04 ENCOUNTER — Other Ambulatory Visit: Payer: Self-pay | Admitting: Family Medicine

## 2024-12-04 ENCOUNTER — Ambulatory Visit: Admitting: Family Medicine

## 2024-12-04 ENCOUNTER — Ambulatory Visit: Payer: Self-pay | Admitting: Family Medicine

## 2024-12-04 LAB — CBC
Hematocrit: 46.8 % (ref 37.5–51.0)
Hemoglobin: 14.9 g/dL (ref 13.0–17.7)
MCH: 29 pg (ref 26.6–33.0)
MCHC: 31.8 g/dL (ref 31.5–35.7)
MCV: 91 fL (ref 79–97)
Platelets: 157 x10E3/uL (ref 150–450)
RBC: 5.13 x10E6/uL (ref 4.14–5.80)
RDW: 13 % (ref 11.6–15.4)
WBC: 4.3 x10E3/uL (ref 3.4–10.8)

## 2024-12-04 LAB — LIPID PANEL
Chol/HDL Ratio: 3.8 ratio (ref 0.0–5.0)
Cholesterol, Total: 113 mg/dL (ref 100–199)
HDL: 30 mg/dL — ABNORMAL LOW
LDL Chol Calc (NIH): 63 mg/dL (ref 0–99)
Triglycerides: 106 mg/dL (ref 0–149)
VLDL Cholesterol Cal: 20 mg/dL (ref 5–40)

## 2024-12-04 LAB — CMP14+EGFR
ALT: 48 IU/L — ABNORMAL HIGH (ref 0–44)
AST: 46 IU/L — ABNORMAL HIGH (ref 0–40)
Albumin: 4 g/dL (ref 3.8–4.8)
Alkaline Phosphatase: 62 IU/L (ref 47–123)
BUN/Creatinine Ratio: 11 (ref 10–24)
BUN: 13 mg/dL (ref 8–27)
Bilirubin Total: 1.2 mg/dL (ref 0.0–1.2)
CO2: 24 mmol/L (ref 20–29)
Calcium: 9.7 mg/dL (ref 8.6–10.2)
Chloride: 101 mmol/L (ref 96–106)
Creatinine, Ser: 1.21 mg/dL (ref 0.76–1.27)
Globulin, Total: 2.1 g/dL (ref 1.5–4.5)
Glucose: 89 mg/dL (ref 70–99)
Potassium: 4.2 mmol/L (ref 3.5–5.2)
Sodium: 140 mmol/L (ref 134–144)
Total Protein: 6.1 g/dL (ref 6.0–8.5)
eGFR: 61 mL/min/1.73

## 2024-12-04 LAB — MICROALBUMIN / CREATININE URINE RATIO
Creatinine, Urine: 303.4 mg/dL
Microalb/Creat Ratio: 5 mg/g{creat} (ref 0–29)
Microalbumin, Urine: 15.3 ug/mL

## 2024-12-04 LAB — HEMOGLOBIN A1C
Est. average glucose Bld gHb Est-mCnc: 111 mg/dL
Hgb A1c MFr Bld: 5.5 % (ref 4.8–5.6)

## 2024-12-04 NOTE — Progress Notes (Signed)
 Hi Tracy Bennett, cholesterol overall looks good the only thing concerning is that the good cholesterol is a little low.  Regular exercise will help bring that up.  Your kidney function looks better this time in fact it is the best it is looked in about 7 months.  Your liver enzymes are slightly elevated so again just continue to work on healthy diet and regular exercise.  Blood count looks great.  A1c was great at 5.5.

## 2024-12-04 NOTE — Progress Notes (Signed)
 Tracy Bennett, good news is your renal function looks a little better it is back down to 1.2 which is great.  You look well-hydrated.  Your liver enzymes were still slightly elevated.  Similar to when they bumped up about 3 years ago it is just a mild elevation I do want a keep an eye on that and plan to recheck again in about 3 months.  Continue to work on healthy diet and regular exercise.

## 2024-12-10 ENCOUNTER — Other Ambulatory Visit: Payer: Self-pay | Admitting: Family Medicine

## 2024-12-13 ENCOUNTER — Other Ambulatory Visit: Payer: Self-pay | Admitting: Family Medicine

## 2024-12-13 DIAGNOSIS — E785 Hyperlipidemia, unspecified: Secondary | ICD-10-CM

## 2024-12-16 ENCOUNTER — Telehealth: Payer: Self-pay

## 2024-12-16 ENCOUNTER — Telehealth: Payer: Self-pay | Admitting: Family Medicine

## 2024-12-16 MED ORDER — PANTOPRAZOLE SODIUM 40 MG PO TBEC: 40.0000 mg | DELAYED_RELEASE_TABLET | Freq: Every day | ORAL | 3 refills | Status: AC

## 2024-12-16 NOTE — Telephone Encounter (Unsigned)
 Copied from CRM [id number]. Topic: Clinical - Medication Refill >> Dec 16, 2024  9:43 AM Rosaria A wrote: Medication: pantoprazole  (PROTONIX ) 40 MG tablet  Has the patient contacted their pharmacy? Yes. Pharmacy stated patient is out of refills. Showing patient still have 3 refills left.    This is the patient's preferred pharmacy:  [id number] - [place], [place] - [address] AT [facility] [address] [place] [address] Phone: [phone] Fax: [phone]  Is this the correct pharmacy for this prescription? Yes    Has the prescription been filled recently? No  Is the patient out of the medication? No  Has the patient been seen for an appointment in the last year OR does the patient have an upcoming appointment? Yes  Can we respond through MyChart? Yes  Agent: Please be advised that Rx refills may take up to 3 business days. We ask that you follow-up with your pharmacy.

## 2024-12-16 NOTE — Telephone Encounter (Signed)
 Prescription sent to pharmacy.

## 2024-12-16 NOTE — Telephone Encounter (Signed)
Disregard. Created in error.

## 2024-12-16 NOTE — Telephone Encounter (Signed)
 Copied from CRM [id number]. Topic: Clinical - Medication Refill >> Dec 16, 2024  9:43 AM Rosaria A wrote: Medication: pantoprazole  (PROTONIX ) 40 MG tablet  Has the patient contacted their pharmacy? Yes. Pharmacy stated patient is out of refills. Showing patient still have 3 refills left.    This is the patient's preferred pharmacy:  [id number] - [place], [place] - [address] AT [facility] [address] [place] [address] Phone: [phone] Fax: [phone]  Is this the correct pharmacy for this prescription? Yes    Has the prescription been filled recently? No  Is the patient out of the medication? No  Has the patient been seen for an appointment in the last year OR does the patient have an upcoming appointment? Yes  Can we respond through MyChart? Yes  Agent: Please be advised that Rx refills may take up to 3 business days. We ask that you follow-up with your pharmacy.

## 2024-12-17 NOTE — Telephone Encounter (Signed)
 Lo like was filled yesterday does hte pharmacy need to be contacted?

## 2024-12-17 NOTE — Telephone Encounter (Signed)
 Pharmacy received the prescription.

## 2025-01-08 ENCOUNTER — Ambulatory Visit: Admitting: Physician Assistant

## 2025-02-09 ENCOUNTER — Ambulatory Visit: Admitting: Family Medicine

## 2025-12-10 ENCOUNTER — Ambulatory Visit
